# Patient Record
Sex: Female | Born: 1945
Health system: Southern US, Community
[De-identification: ages and names within clinical notes are randomized; demographics above are authoritative.]

## PROBLEM LIST (undated history)

## (undated) DIAGNOSIS — M47812 Spondylosis without myelopathy or radiculopathy, cervical region: Secondary | ICD-10-CM

## (undated) DIAGNOSIS — M1990 Unspecified osteoarthritis, unspecified site: Secondary | ICD-10-CM

## (undated) DIAGNOSIS — I872 Venous insufficiency (chronic) (peripheral): Secondary | ICD-10-CM

## (undated) DIAGNOSIS — G629 Polyneuropathy, unspecified: Secondary | ICD-10-CM

## (undated) DIAGNOSIS — I739 Peripheral vascular disease, unspecified: Secondary | ICD-10-CM

## (undated) DIAGNOSIS — K449 Diaphragmatic hernia without obstruction or gangrene: Secondary | ICD-10-CM

## (undated) DIAGNOSIS — G4733 Obstructive sleep apnea (adult) (pediatric): Secondary | ICD-10-CM

## (undated) DIAGNOSIS — N3281 Overactive bladder: Secondary | ICD-10-CM

## (undated) DIAGNOSIS — I10 Essential (primary) hypertension: Secondary | ICD-10-CM

## (undated) DIAGNOSIS — K219 Gastro-esophageal reflux disease without esophagitis: Secondary | ICD-10-CM

## (undated) DIAGNOSIS — E119 Type 2 diabetes mellitus without complications: Secondary | ICD-10-CM

## (undated) DIAGNOSIS — M797 Fibromyalgia: Secondary | ICD-10-CM

## (undated) DIAGNOSIS — M069 Rheumatoid arthritis, unspecified: Secondary | ICD-10-CM

## (undated) DIAGNOSIS — K929 Disease of digestive system, unspecified: Secondary | ICD-10-CM

## (undated) DIAGNOSIS — H409 Unspecified glaucoma: Secondary | ICD-10-CM

## (undated) HISTORY — PX: ABDOMINAL HYSTERECTOMY: SHX81

## (undated) HISTORY — DX: Venous insufficiency (chronic) (peripheral): I87.2

## (undated) HISTORY — DX: Obstructive sleep apnea (adult) (pediatric): G47.33

## (undated) HISTORY — PX: CARPAL TUNNEL RELEASE: SHX101

## (undated) HISTORY — PX: BLADDER SURGERY: SHX569

## (undated) HISTORY — PX: CHOLECYSTECTOMY: SHX55

## (undated) HISTORY — DX: Peripheral vascular disease, unspecified: I73.9

## (undated) HISTORY — PX: TOTAL KNEE ARTHROPLASTY: SHX125

---

## 1992-06-22 DIAGNOSIS — M47812 Spondylosis without myelopathy or radiculopathy, cervical region: Secondary | ICD-10-CM

## 1992-06-22 HISTORY — DX: Spondylosis without myelopathy or radiculopathy, cervical region: M47.812

## 2004-05-19 ENCOUNTER — Encounter: Payer: Self-pay | Admitting: Occupational Therapy

## 2004-05-22 ENCOUNTER — Encounter: Payer: Self-pay | Admitting: Occupational Therapy

## 2004-06-22 ENCOUNTER — Encounter: Payer: Self-pay | Admitting: Occupational Therapy

## 2004-12-02 ENCOUNTER — Ambulatory Visit: Admission: RE | Admit: 2004-12-02 | Discharge: 2004-12-02 | Payer: Self-pay | Admitting: Occupational Therapy

## 2004-12-05 ENCOUNTER — Ambulatory Visit: Payer: Self-pay | Admitting: Pulmonary Disease

## 2005-01-11 ENCOUNTER — Emergency Department (HOSPITAL_COMMUNITY): Admission: EM | Admit: 2005-01-11 | Discharge: 2005-01-11 | Payer: Self-pay | Admitting: Emergency Medicine

## 2005-11-09 ENCOUNTER — Ambulatory Visit: Admission: RE | Admit: 2005-11-09 | Discharge: 2005-11-09 | Payer: Self-pay | Admitting: Nurse Practitioner

## 2012-07-21 ENCOUNTER — Ambulatory Visit (HOSPITAL_COMMUNITY)
Admission: RE | Admit: 2012-07-21 | Discharge: 2012-07-21 | Disposition: A | Discharge status: Home / Self Care | Payer: Medicare Other | Source: Ambulatory Visit | Attending: Internal Medicine | Admitting: Internal Medicine

## 2012-07-21 DIAGNOSIS — M542 Cervicalgia: Secondary | ICD-10-CM | POA: Insufficient documentation

## 2012-07-21 DIAGNOSIS — M545 Low back pain, unspecified: Secondary | ICD-10-CM | POA: Insufficient documentation

## 2012-07-21 DIAGNOSIS — M6281 Muscle weakness (generalized): Secondary | ICD-10-CM | POA: Insufficient documentation

## 2012-07-21 DIAGNOSIS — IMO0001 Reserved for inherently not codable concepts without codable children: Secondary | ICD-10-CM | POA: Insufficient documentation

## 2012-07-21 DIAGNOSIS — M25519 Pain in unspecified shoulder: Secondary | ICD-10-CM | POA: Insufficient documentation
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
## 2012-07-26 ENCOUNTER — Ambulatory Visit (HOSPITAL_COMMUNITY)
Admission: RE | Admit: 2012-07-26 | Discharge: 2012-07-26 | Disposition: A | Discharge status: Home / Self Care | Payer: Medicare Other | Source: Ambulatory Visit | Attending: Internal Medicine | Admitting: Internal Medicine

## 2012-07-26 DIAGNOSIS — IMO0001 Reserved for inherently not codable concepts without codable children: Secondary | ICD-10-CM | POA: Insufficient documentation

## 2012-07-26 DIAGNOSIS — M6281 Muscle weakness (generalized): Secondary | ICD-10-CM | POA: Insufficient documentation

## 2012-07-26 DIAGNOSIS — M25519 Pain in unspecified shoulder: Secondary | ICD-10-CM | POA: Insufficient documentation

## 2012-07-26 DIAGNOSIS — M545 Low back pain, unspecified: Secondary | ICD-10-CM | POA: Insufficient documentation

## 2012-07-26 DIAGNOSIS — M542 Cervicalgia: Secondary | ICD-10-CM | POA: Insufficient documentation

## 2012-07-26 NOTE — Progress Notes (Signed)
Physical Therapy Treatment Patient Details  Name: Chloe Golden MRN: [id number] Date of Birth: 03/02/1946  Today's Date: 07/26/2012 Time: 1019-1050 PT Time Calculation (min): 31 min  Visit#: 2  of 8   Re-eval: 08/20/12 Diagnosis:  Neck/shoulder pain Next MD Visit: Dr. Ignacia Palma - internal medicine.  Prior Therapy: conservative treatment for fibromyalgia for neck/shoulder and back pain Authorization: MEDICARE  Authorization Visit#: 2  of 10  Charges:  therex 27', MHP X 1   Subjective: Symptoms/Limitations Symptoms: Pt. states she is aching all over today, possibly due to the weather.  Reports pain as 8/10 today from cervical area down to lumbar and also into shoulders. Pain Assessment Currently in Pain?: Yes Pain Score:   8 Pain Location: Back Pain Orientation: Upper;Mid;Lower;Medial   Exercise/Treatments Seated Other Seated Lumbar Exercises: heel roll outs 2x10 sec holds Supine Ab Set: Limitations AB Set Limitations: NMR for activation 5X10" holds Glut Set: 5 reps Clam: 5 reps Bent Knee Raise: 5 reps Bridge: 5 reps Straight Leg Raise: 5 reps   Modalities Modalities: Moist Heat Moist Heat Therapy Number Minutes Moist Heat: 25 Minutes Moist Heat Location:  (cervical and lumbar with supine therex)  Physical Therapy Assessment and Plan PT Assessment and Plan Clinical Impression Statement: Pt. with increased pain today; added MHP to supine therex and progress stab exercises in supine.  Held prone exericse today and addition of SL due to increased pain.  Pt. reported overall pain reduction at end of session today. PT Plan: Progress to SL hip abduction, prone hip extension, POE and spine extension.  Manual techniques and modalities as needed per PT POC.      Problem List Patient Active Problem List  Diagnosis  . Shoulder pain  . Neck pain  . Lumbago      Amy Kae Heller, PTA/CLT 07/26/2012, 10:57 AM

## 2012-07-28 ENCOUNTER — Ambulatory Visit (HOSPITAL_COMMUNITY)
Admission: RE | Admit: 2012-07-28 | Discharge: 2012-07-28 | Disposition: A | Discharge status: Home / Self Care | Payer: Medicare Other | Source: Ambulatory Visit | Attending: Physical Therapy | Admitting: Physical Therapy

## 2012-07-28 NOTE — Progress Notes (Signed)
Physical Therapy Re-evaluation / Discharge  Patient Details  Name: Chloe Golden MRN: [id number] Date of Birth: 16-Dec-1945  Today's Date: 07/28/2012 Time: 1015-1100 PT Time Calculation (min): 45 min  Visit#: 3  of 8   Re-eval: 08/20/12 Diagnosis: Neck/shoulder pain Next MD Visit: Dr. Ignacia Palma - internal medicine.  Prior Therapy: conservative treatment for fibromyalgia for neck/shoulder and back pain Authorization: MEDICARE  Authorization Visit#: 3  of 10   Charges: therex 26', MMT X 1  Subjective Symptoms/Limitations Symptoms: Pt. states her daughter bought her a contoured pillow and a heating pad and has helped.  States she has no neck pain today and lumbar pain is decreased to 6/10 right now. Pain Assessment Currently in Pain?: Yes Pain Score:   6 Pain Location: Back Pain Orientation: Upper;Mid;Lower;Medial   Objective: Functional Tests Functional Tests: NDI: 42% (was 36%)   RUE Strength Right Shoulder Flexion: 5/5 (was 4/5) Right Shoulder Extension: 4/5 (was 3+/5) Right Shoulder ABduction: 4/5 (was 3+/5) Right Shoulder Internal Rotation: 4/5 (was 3+/5) Right Shoulder External Rotation: 4/5 (ws 3+/5) Right Shoulder Horizontal ABduction: 4/5 (was 3+/5) Right Shoulder Horizontal ADduction: 4/5 (was 3+/5)  LUE Strength Left Shoulder Flexion: 5/5 (was 4/5) Left Shoulder Extension: 4/5 (was 4/5) Left Shoulder ABduction: 5/5 (was 4/5) Left Shoulder Internal Rotation: 4/5 (was 4/5) Left Shoulder External Rotation: 4/5 (was 4/5) Left Shoulder Horizontal ABduction: 4/5 (was 4/5) Left Shoulder Horizontal ADduction: 4/5 (was 4/5)  RLE Strength Right Hip Flexion: 5/5 (was 3+/5) Right Hip Extension: 5/5 (was 3+/5) Right Hip ABduction: 4/5 (was 4/5) Right Knee Extension: 4/5 (was 4/5) Right Ankle Dorsiflexion: 5/5 (was 4/5)  LLE Strength Left Hip Flexion: 5/5 (was 4/5) Left Hip Extension: 4/5 (was 4/5) Left Hip ABduction: 4/5 (was 4/5) Left Knee Flexion: 5/5 (was  5/5) Left Knee Extension: 5/5 (was 5/5)  Cervical AROM Overall Cervical AROM Comments: all WNL - less pain w/rotation  Lumbar AROM Lumbar Flexion: WNL  Lumbar Extension: WNL  Lumbar - Right Side Bend: WNL Lumbar - Left Side Bend: WNL   Exercise/Treatments Supine Glut Set: 10 reps Clam: 10 reps Bent Knee Raise: 10 reps Bridge: 10 reps Straight Leg Raise: 10 reps Prone  Straight Leg Raise: 5 reps Other Prone Lumbar Exercises: Multifidus: NMR w/anterior tilt Other Prone Lumbar Exercises: POE 5 minutes    Physical Therapy Assessment and Plan PT Assessment and Plan Clinical Impression Statement: Pt has overall improved X past 3 visits and is being compliant with HEP.  Pt. has met 2/4 STG's and none of her LTG's due to only completing 3 visits.  Pt. educated on postural awareness/core stability  and given updated HEP.  Pt would like to discontinue therapy at this time due to financial reasons.  Pt. will continue and progress exercises with HEP. PT Plan: Discharged to HEP per pt. request.   Pt. Given updated HEP with written instructions.  Goals Home Exercise Program Pt will Perform Home Exercise Program: Independently PT Goal: Perform Home Exercise Program - Progress: Met  PT Short Term Goals Time to Complete Short Term Goals: 3 weeks PT Short Term Goal 1: Pt will report pain less than 6/10 for 50% of her day for improved QOL.   PT Short Term Goal 1 - Progress: Not met PT Short Term Goal 2: Pt will present with mild fascial restriction to cervical - lumbar musculature to decrease overall pain.  PT Short Term Goal 2 - Progress: Partly met PT Short Term Goal 3: Pt will present with cervical and shoulder  ROM WNL w/o pain at end range in order to begin cutting vegatables with greater ease.  PT Short Term Goal 3 - Progress: Met PT Short Term Goal 4: Pt will require mod cueing for core musculature in order to progress towards apporpriate posture.   PT Short Term Goal 4 - Progress:  Partly met PT Short Term Goal 5: Pt will present with improved body awareness and demonstrate sitting and standing posture and require mod cueing to comfortably continue with dinner activities.  PT Short Term Goal 5 - Progress: Met  PT Long Term Goals Time to Complete Long Term Goals: 8 weeks PT Long Term Goal 1: Pt will report pain less than 4/10 when standing for greater than 45 minutes in order to prepare a meal.  PT Long Term Goal 1 - Progress: Not met PT Long Term Goal 2: Pt will score less than a 20% on the NDI for improved percieved functional ability.  PT Long Term Goal 2 - Progress: Not met Long Term Goal 3: Pt will be independent with core muscle activiation in order to tolerate standing for greater than 45 minutes to prepare a meal. Long Term Goal 3 Progress: Not met Long Term Goal 4: Pt will improve LE flexibility w/SLR greater than 90 degrees in order to decrease numbness to B LE in order to tolerate walking for 1 hour to go shopping without increaed foot pain.  Long Term Goal 4 Progress: Not met  Problem List Patient Active Problem List  Diagnosis  . Shoulder pain  . Neck pain  . Lumbago    PT - End of Session Activity Tolerance: Patient tolerated treatment well General Behavior During Session: [facility] for tasks performed Cognition: [facility] for tasks performed   Lurena Nida, PTA/CLT 07/28/2012, 11:16 AM

## 2012-08-02 ENCOUNTER — Ambulatory Visit (HOSPITAL_COMMUNITY): Payer: Medicare Other | Admitting: Physical Therapy

## 2012-08-05 ENCOUNTER — Ambulatory Visit (HOSPITAL_COMMUNITY): Payer: Medicare Other

## 2012-08-09 ENCOUNTER — Ambulatory Visit (HOSPITAL_COMMUNITY): Payer: Medicare Other

## 2012-08-11 ENCOUNTER — Ambulatory Visit (HOSPITAL_COMMUNITY): Payer: Medicare Other | Admitting: Physical Therapy

## 2012-09-26 ENCOUNTER — Encounter (HOSPITAL_COMMUNITY): Payer: Self-pay | Admitting: Emergency Medicine

## 2012-09-26 ENCOUNTER — Emergency Department (HOSPITAL_COMMUNITY)
Admission: EM | Admit: 2012-09-26 | Discharge: 2012-09-26 | Disposition: A | Discharge status: Home / Self Care | Payer: Medicare Other | Attending: Emergency Medicine | Admitting: Emergency Medicine

## 2012-09-26 DIAGNOSIS — R21 Rash and other nonspecific skin eruption: Secondary | ICD-10-CM | POA: Insufficient documentation

## 2012-09-26 DIAGNOSIS — R079 Chest pain, unspecified: Secondary | ICD-10-CM

## 2012-09-26 DIAGNOSIS — R197 Diarrhea, unspecified: Secondary | ICD-10-CM

## 2012-09-26 DIAGNOSIS — R6883 Chills (without fever): Secondary | ICD-10-CM | POA: Insufficient documentation

## 2012-09-26 DIAGNOSIS — Z8719 Personal history of other diseases of the digestive system: Secondary | ICD-10-CM | POA: Insufficient documentation

## 2012-09-26 DIAGNOSIS — Z8669 Personal history of other diseases of the nervous system and sense organs: Secondary | ICD-10-CM | POA: Insufficient documentation

## 2012-09-26 DIAGNOSIS — Z8619 Personal history of other infectious and parasitic diseases: Secondary | ICD-10-CM | POA: Insufficient documentation

## 2012-09-26 DIAGNOSIS — R109 Unspecified abdominal pain: Secondary | ICD-10-CM

## 2012-09-26 DIAGNOSIS — R1013 Epigastric pain: Secondary | ICD-10-CM | POA: Insufficient documentation

## 2012-09-26 DIAGNOSIS — R072 Precordial pain: Secondary | ICD-10-CM | POA: Insufficient documentation

## 2012-09-26 DIAGNOSIS — R110 Nausea: Secondary | ICD-10-CM | POA: Insufficient documentation

## 2012-09-26 DIAGNOSIS — Z90710 Acquired absence of both cervix and uterus: Secondary | ICD-10-CM | POA: Insufficient documentation

## 2012-09-26 DIAGNOSIS — R0602 Shortness of breath: Secondary | ICD-10-CM | POA: Insufficient documentation

## 2012-09-26 DIAGNOSIS — Z8739 Personal history of other diseases of the musculoskeletal system and connective tissue: Secondary | ICD-10-CM | POA: Insufficient documentation

## 2012-09-26 DIAGNOSIS — I10 Essential (primary) hypertension: Secondary | ICD-10-CM | POA: Insufficient documentation

## 2012-09-26 DIAGNOSIS — H9209 Otalgia, unspecified ear: Secondary | ICD-10-CM | POA: Insufficient documentation

## 2012-09-26 HISTORY — DX: Gastro-esophageal reflux disease without esophagitis: K21.9

## 2012-09-26 HISTORY — DX: Spondylosis without myelopathy or radiculopathy, cervical region: M47.812

## 2012-09-26 HISTORY — DX: Unspecified osteoarthritis, unspecified site: M19.90

## 2012-09-26 HISTORY — DX: Essential (primary) hypertension: I10

## 2012-09-26 HISTORY — DX: Fibromyalgia: M79.7

## 2012-09-26 HISTORY — DX: Rheumatoid arthritis, unspecified: M06.9

## 2012-09-26 HISTORY — DX: Polyneuropathy, unspecified: G62.9

## 2012-09-26 LAB — COMPREHENSIVE METABOLIC PANEL
ALT: 15 U/L (ref 0–35)
AST: 20 U/L (ref 0–37)
Albumin: 3.6 g/dL (ref 3.5–5.2)
Alkaline Phosphatase: 92 U/L (ref 39–117)
BUN: 13 mg/dL (ref 6–23)
CO2: 25 mEq/L (ref 19–32)
Calcium: 8.9 mg/dL (ref 8.4–10.5)
Chloride: 98 mEq/L (ref 96–112)
Creatinine, Ser: 0.84 mg/dL (ref 0.50–1.10)
GFR calc Af Amer: 82 mL/min — ABNORMAL LOW (ref >90)
GFR calc non Af Amer: 71 mL/min — ABNORMAL LOW (ref >90)
Glucose, Bld: 122 mg/dL — ABNORMAL HIGH (ref 70–99)
Potassium: 3.5 mEq/L (ref 3.5–5.1)
Sodium: 135 mEq/L (ref 135–145)
Total Bilirubin: 0.2 mg/dL — ABNORMAL LOW (ref 0.3–1.2)
Total Protein: 7.1 g/dL (ref 6.0–8.3)

## 2012-09-26 LAB — URINE MICROSCOPIC-ADD ON

## 2012-09-26 LAB — CBC WITH DIFFERENTIAL/PLATELET
Basophils Absolute: 0 10*3/uL (ref 0.0–0.1)
Basophils Relative: 0 % (ref 0–1)
Eosinophils Absolute: 0.1 10*3/uL (ref 0.0–0.7)
Eosinophils Relative: 1 % (ref 0–5)
HCT: 38.2 % (ref 36.0–46.0)
Hemoglobin: 12.7 g/dL (ref 12.0–15.0)
Lymphocytes Relative: 6 % — ABNORMAL LOW (ref 12–46)
Lymphs Abs: 0.5 10*3/uL — ABNORMAL LOW (ref 0.7–4.0)
MCH: 29.5 pg (ref 26.0–34.0)
MCHC: 33.2 g/dL (ref 30.0–36.0)
MCV: 88.8 fL (ref 78.0–100.0)
Monocytes Absolute: 0.3 10*3/uL (ref 0.1–1.0)
Monocytes Relative: 4 % (ref 3–12)
Neutro Abs: 7.3 10*3/uL (ref 1.7–7.7)
Neutrophils Relative %: 89 % — ABNORMAL HIGH (ref 43–77)
Platelets: 316 10*3/uL (ref 150–400)
RBC: 4.3 MIL/uL (ref 3.87–5.11)
RDW: 13.6 % (ref 11.5–15.5)
WBC: 8.1 10*3/uL (ref 4.0–10.5)

## 2012-09-26 LAB — URINALYSIS, ROUTINE W REFLEX MICROSCOPIC
Bilirubin Urine: NEGATIVE
Glucose, UA: NEGATIVE mg/dL
Ketones, ur: NEGATIVE mg/dL
Leukocytes, UA: NEGATIVE
Nitrite: NEGATIVE
Protein, ur: NEGATIVE mg/dL
Specific Gravity, Urine: 1.015 (ref 1.005–1.030)
Urobilinogen, UA: 0.2 mg/dL (ref 0.0–1.0)
pH: 5.5 (ref 5.0–8.0)

## 2012-09-26 LAB — TROPONIN I
Troponin I: <0.3 ng/mL (ref <0.30)
Troponin I: <0.3 ng/mL (ref <0.30)

## 2012-09-26 LAB — LIPASE, BLOOD: Lipase: 13 U/L (ref 11–59)

## 2012-09-26 MED ORDER — MORPHINE SULFATE 2 MG/ML IJ SOLN
2.0000 mg | Freq: Once | INTRAMUSCULAR | Status: AC
  Administered 2012-09-26: 2 mg via INTRAVENOUS
  Filled 2012-09-26: qty 1

## 2012-09-26 MED ORDER — ONDANSETRON HCL 4 MG/2ML IJ SOLN
4.0000 mg | Freq: Once | INTRAMUSCULAR | Status: AC
Start: 2012-09-26 — End: 2012-09-26
  Administered 2012-09-26: 4 mg via INTRAVENOUS
  Filled 2012-09-26: qty 2

## 2012-09-26 MED ORDER — PANTOPRAZOLE SODIUM 40 MG IV SOLR
40.0000 mg | Freq: Once | INTRAVENOUS | Status: AC
  Administered 2012-09-26: 40 mg via INTRAVENOUS
  Filled 2012-09-26: qty 40

## 2012-09-26 NOTE — ED Provider Notes (Signed)
History  This chart was scribed for Chloe Gaskins, MD by Bennett Scrape, ED Scribe. This patient was seen in room APA05/[facility] and the patient's care was started at 8:16 PM.  CSN: [id number]  Arrival date & time 09/26/12  1955   First MD Initiated Contact with Patient 09/26/12 2016      Chief Complaint  Patient presents with  . Abdominal Pain  . Nausea  . Diarrhea    Patient is a 67 y.o. female presenting with chest pain. The history is provided by the patient. No language interpreter was used.  Chest Pain Pain location:  Substernal area and epigastric Pain quality: burning   Pain radiates to:  Epigastrium Pain radiates to the back: no   Onset quality:  Gradual Duration:  3 months Timing:  Constant Progression:  Worsening Chronicity:  New Relieved by:  Nothing Worsened by:  Nothing tried Associated symptoms: abdominal pain, nausea and shortness of breath   Associated symptoms: no cough, no fever, no syncope and not vomiting   Risk factors: no diabetes mellitus, no hypertension, no prior DVT/PE and no smoking     TEHYA LEATH is a 67 y.o. female with a h/o GERD who presents to the Emergency Department complaining of worsening of 3 months of ongoing lower substernal CP described as burning that radiates up from the epigastric region with intermittent SOB and nausea. She rates her pain an 8 out of 10 currently. She reports that she is being followed by Dr. Allena Katz for the same as well as 3 months of abdominal bloating, otalgia, chills, and diarrhea. She states that during her last appointment three weeks ago she was told that she had a negative CT scan and was advised to take Pepto bismol.  She denies that stool samples or blood work was performed. She states that she has been taking Pepto bismol with no improvement. She reports a chronic rash on the chest with no recent changes. She denies syncope, hematochezia, melena, fever and emesis as associated symptoms. She denies any prior  MI or CVA diagnosese. She has a h/o abdominal hysterectomy, cholecystectomy and bacterial infection of the colon 7 years ago. She denies smoking and alcohol use.  Past Medical History  Diagnosis Date  . Acid reflux   . Hypertension   . Neuropathy   . Arthritis   . Rheumatoid arthritis   . Degenerative arthritis of cervical spine   . Fibromyalgia     Past Surgical History  Procedure Laterality Date  . Cholecystectomy    . Abdominal hysterectomy    . Carpal tunnel release    . Bladder surgery      History reviewed. No pertinent family history.  History  Substance Use Topics  . Smoking status: Never Smoker   . Smokeless tobacco: Not on file  . Alcohol Use: No    No OB history provided.  Review of Systems  Constitutional: Positive for chills. Negative for fever.  HENT: Positive for ear pain. Negative for sore throat.   Respiratory: Positive for shortness of breath. Negative for cough.   Cardiovascular: Positive for chest pain. Negative for syncope.  Gastrointestinal: Positive for nausea, abdominal pain and diarrhea. Negative for vomiting and blood in stool.  All other systems reviewed and are negative.    Allergies  Ciprofloxacin; Ibuprofen; and Valium  Home Medications  No current outpatient prescriptions on file.  Triage Vitals: BP 173/76  Pulse 93  Temp(Src) 98.6 F (37 C) (Oral)  Resp 18  Ht 5'  4" (1.626 m)  Wt 240 lb (108.863 kg)  BMI 41.18 kg/m2  SpO2 94%  Physical Exam  Nursing note and vitals reviewed.  CONSTITUTIONAL: Well developed/well nourished HEAD: Normocephalic/atraumatic EYES: EOMI/PERRL, no icterus ENMT: Mucous membranes moist NECK: supple no meningeal signs SPINE:entire spine nontender CV: S1/S2 noted, no murmurs/rubs/gallops noted LUNGS: Lungs are clear to auscultation bilaterally, no apparent distress ABDOMEN: soft, nontender, no rebound or guarding GU:no cva tenderness NEURO: Pt is awake/alert, moves all  extremitiesx4 EXTREMITIES: pulses normal, full ROM SKIN: warm, color normal PSYCH: no abnormalities of mood noted   ED Course  Procedures   DIAGNOSTIC STUDIES: Oxygen Saturation is 94% on room air, adequate by my interpretation.    COORDINATION OF CARE: 8:23 PM-Discussed treatment plan which includes medications, CBC panel, CMP, and troponin with pt at bedside and pt agreed to plan.   8:30 PM-Ordered 40 mg protonix injection, 2 mg morphine and 4 mg Zofran injection  9:19 PM- Pt rechecked and states that the medications improved her symptoms. She states that her CP begins when she gets bloated. The pain began at 4 PM this evening and ended around 7 PM. She denies having any significant CP currently. Informed pt of lab work thus far. Will provide a GI referral.  10:47 PM Repeat troponin negative Pt without any recurrence of symptoms She is in no distress She requests referral to local [facility] for d/c  Labs Reviewed  COMPREHENSIVE METABOLIC PANEL - Abnormal; Notable for the following:    Glucose, Bld 122 (*)    Total Bilirubin 0.2 (*)    GFR calc non Af Amer 71 (*)    GFR calc Af Amer 82 (*)    All other components within normal limits  CBC WITH DIFFERENTIAL - Abnormal; Notable for the following:    Neutrophils Relative 89 (*)    Lymphocytes Relative 6 (*)    Lymphs Abs 0.5 (*)    All other components within normal limits  URINALYSIS, ROUTINE W REFLEX MICROSCOPIC - Abnormal; Notable for the following:    Hgb urine dipstick TRACE (*)    All other components within normal limits  URINE MICROSCOPIC-ADD ON - Abnormal; Notable for the following:    Squamous Epithelial / LPF FEW (*)    Bacteria, UA FEW (*)    All other components within normal limits  URINE CULTURE  LIPASE, BLOOD  TROPONIN I  TROPONIN I     MDM  Nursing notes including past medical history and social history reviewed and considered in documentation Labs/vital reviewed and considered     Date:  09/26/2012  Rate: 87  Rhythm: normal sinus rhythm  QRS Axis: normal  Intervals: normal  ST/T Wave abnormalities: normal  Conduction Disutrbances:none  Narrative Interpretation:   Old EKG Reviewed: none available at time of interpretation    I personally performed the services described in this documentation, which was scribed in my presence. The recorded information has been reviewed and is accurate.   Chloe Gaskins, MD 09/26/12 2248

## 2012-09-26 NOTE — ED Notes (Signed)
Patient complaining of abdominal pain, nausea, and diarrhea x 3 months, worsening today and states pain is radiating into mid chest area.

## 2012-09-26 NOTE — ED Notes (Signed)
Patient ambulatory to restroom to collect urine sample. Family at bedside

## 2012-09-27 ENCOUNTER — Telehealth: Payer: Self-pay

## 2012-09-27 NOTE — Telephone Encounter (Signed)
Per [place], just schedule with [facility] on 09/28/2012. Pt scheduled and she is aware of appt.

## 2012-09-27 NOTE — Telephone Encounter (Signed)
There is an opening on Dr. Luvenia Starch schedule at 10:30 today, you can ask him.  Otherwise, you can schedule her for my 9:30 and 9:45 tomorrow.   Do not use one of the urgent's for this since she was just seen in ED and work-up okay so far.

## 2012-09-27 NOTE — Telephone Encounter (Signed)
Pt was seen in ED yesterday for abdominal pain. She is calling this AM for appt. Said she has been bloated for about 2 months and at night she feels like she is "going to burst". She has been nauseated x 2 days and can hardly eat anything but a few bites of dry cracker. She was up all night with nausea, but cannot vomit. Her abdominal pain is in the middle of her stomach and down. She rates it at an 8 this morning. ( She has had her GB removed). She had diarrhea yesterday but nothing today. She said she had a bacteria in intestines some years ago and she is concerned about that. Please advise!

## 2012-09-28 ENCOUNTER — Encounter: Payer: Self-pay | Admitting: Gastroenterology

## 2012-09-28 ENCOUNTER — Ambulatory Visit (INDEPENDENT_AMBULATORY_CARE_PROVIDER_SITE_OTHER): Payer: Medicare Other | Admitting: Gastroenterology

## 2012-09-28 VITALS — BP 134/71 | HR 73 | Temp 98.4°F | Ht 64.0 in | Wt 198.2 lb

## 2012-09-28 DIAGNOSIS — R142 Eructation: Secondary | ICD-10-CM

## 2012-09-28 DIAGNOSIS — R635 Abnormal weight gain: Secondary | ICD-10-CM | POA: Insufficient documentation

## 2012-09-28 DIAGNOSIS — R1314 Dysphagia, pharyngoesophageal phase: Secondary | ICD-10-CM

## 2012-09-28 DIAGNOSIS — R141 Gas pain: Secondary | ICD-10-CM

## 2012-09-28 DIAGNOSIS — K449 Diaphragmatic hernia without obstruction or gangrene: Secondary | ICD-10-CM | POA: Insufficient documentation

## 2012-09-28 DIAGNOSIS — R1319 Other dysphagia: Secondary | ICD-10-CM

## 2012-09-28 DIAGNOSIS — R1310 Dysphagia, unspecified: Secondary | ICD-10-CM | POA: Insufficient documentation

## 2012-09-28 DIAGNOSIS — R140 Abdominal distension (gaseous): Secondary | ICD-10-CM | POA: Insufficient documentation

## 2012-09-28 DIAGNOSIS — R197 Diarrhea, unspecified: Secondary | ICD-10-CM | POA: Insufficient documentation

## 2012-09-28 DIAGNOSIS — K219 Gastro-esophageal reflux disease without esophagitis: Secondary | ICD-10-CM | POA: Insufficient documentation

## 2012-09-28 MED ORDER — ONDANSETRON HCL 4 MG PO TABS: 4.0000 mg | ORAL_TABLET | Freq: Three times a day (TID) | ORAL | Status: DC | PRN

## 2012-09-28 NOTE — Progress Notes (Signed)
Primary Care Physician:  Carlota Raspberry, MD  Primary Gastroenterologist:  Jonette Eva, MD   Chief Complaint  Patient presents with  . Abdominal Pain  . Bloated    HPI:  Chloe Golden is a 67 y.o. female here for next day appointment. She called yesterday after being seen in ED for abdominal pain, bloating, nausea.  Four to five months of abdominal swelling and bloating. Doesn't matter if eats or not. Will have it even with fasting. Seven years ago the same problem. Has been seeing Dr. Allena Katz. States that she is no longer going back to see him the CT recently. States she does not eat fried or fast foods due to bad GERD. Keeps diarrhea. Small amount of [place] stool 7. Never has a warning. Fecal incontinence. Usually 4-5 small BMs, loose stool daily. Tried Linzess, but worse diarrhea. Took only couple of times. Has to take stool softener everyday or gets constipated. Lately has been taking Pepto-Bismol 2 before each meal for the diarrhea. States that Dr. Allena Katz says she can take it for the rest of her life.   Went to ER due to Abdominal pain, nausea, regurgitation. States that her heart checked out fine. She has chronic spontaneous regurgitation. Had been on nexium for years, now on Dexilant for one month. Large hiatal hernia. Last EGD end of 2013. Colonoscopy five years ago. ?H.Pylori before. H/O one episode diverticulitis.   [facility] for two years. Takes stool softners every day otherwise stools get hard. Tramadol two times per day. Carafate BID before a meal. Pepto-Bismol 2 tablets before meals. Sleep stacked up on four pills. Bed elevated. Hernia for thirty years. No esophageal stretching. Drink two glasses of water to wash food down. Still with heartburn even on PPI. No brbpr. Black stools on Pepto. Constant nausea and epigastric burning. Weight gain of 15-20 pounds in the last one year. No NSAIDS/ASA.   Current Outpatient Prescriptions  Medication Sig Dispense Refill  . bismuth  subsalicylate (PEPTO BISMOL) 262 MG chewable tablet Chew 524 mg by mouth 4 (four) times daily -  before meals and at bedtime.      Marland Kitchen CARAFATE 1 GM/10ML suspension Take 1 g by mouth.       . dexlansoprazole (DEXILANT) 60 MG capsule Take 60 mg by mouth every morning.      . docusate sodium (COLACE) 100 MG capsule Take 100 mg by mouth every morning.      . Eszopiclone (ESZOPICLONE) 3 MG TABS Take 3 mg by mouth at bedtime. Take immediately before bedtime      . gabapentin (NEURONTIN) 300 MG capsule Take 300-600 mg by mouth 3 (three) times daily. Two capsules taken in the morning, One capsule at lunch, and two capsules with the evening meal      . losartan-hydrochlorothiazide (HYZAAR) 50-12.5 MG per tablet Take 1 tablet by mouth every morning.      . nebivolol (BYSTOLIC) 5 MG tablet Take 5 mg by mouth at bedtime.      . Probiotic Product (PHILLIPS COLON HEALTH PO) Take 1 capsule by mouth every morning.      . sertraline (ZOLOFT) 100 MG tablet Take 100 mg by mouth every morning.      . silodosin (RAPAFLO) 8 MG CAPS capsule Take 8 mg by mouth every morning.      Marland Kitchen tiZANidine (ZANAFLEX) 4 MG tablet Take 4 mg by mouth at bedtime.      . traMADol (ULTRAM) 50 MG tablet Take 50 mg by mouth  2 (two) times daily as needed for pain.      Marland Kitchen ondansetron (ZOFRAN) 4 MG tablet Take 1 tablet (4 mg total) by mouth every 8 (eight) hours as needed for nausea.  30 tablet  1   No current facility-administered medications for this visit.    Allergies as of 09/28/2012 - Review Complete 09/28/2012  Allergen Reaction Noted  . Ciprofloxacin Nausea And Vomiting and Other (See Comments) 09/26/2012  . Codeine Other (See Comments) 09/26/2012  . Ibuprofen Other (See Comments) 09/26/2012  . Valium (diazepam) Other (See Comments) 09/26/2012    Past Medical History  Diagnosis Date  . Acid reflux   . Hypertension   . Neuropathy   . Arthritis   . Rheumatoid arthritis   . Degenerative arthritis of cervical spine   .  Fibromyalgia     Past Surgical History  Procedure Laterality Date  . Cholecystectomy    . Abdominal hysterectomy    . Carpal tunnel release    . Bladder surgery      Family History  Problem Relation Age of Onset  . Lung cancer Father   . Colon cancer Neg Hx   . Crohn's disease Cousin   . Celiac disease Neg Hx     History   Social History  . Marital Status: Single    Spouse Name: N/A    Number of Children: N/A  . Years of Education: N/A   Occupational History  . Not on file.   Social History Main Topics  . Smoking status: Never Smoker   . Smokeless tobacco: Not on file  . Alcohol Use: No  . Drug Use: No  . Sexually Active: Not on file   Other Topics Concern  . Not on file   Social History Narrative   1 living   2 deceased      ROS:  General: Negative for anorexia, weight loss, fever, chills, fatigue, weakness. Eyes: Negative for vision changes.  ENT: Negative for hoarseness,  nasal congestion. CV: Negative for chest pain, angina, palpitations, dyspnea on exertion, peripheral edema.  Respiratory: Negative for dyspnea at rest, dyspnea on exertion, cough, sputum, wheezing.  GI: See history of present illness. GU:  Negative for dysuria, hematuria, urinary incontinence, urinary frequency, nocturnal urination.  MS: Negative for joint pain, low back pain.  Derm: Negative for rash or itching.  Neuro: Negative for weakness, abnormal sensation, seizure, frequent headaches, memory loss, confusion.  Psych: Negative for anxiety, depression, suicidal ideation, hallucinations.  Endo: see hpi  Heme: Negative for bruising or bleeding. Allergy: Negative for rash or hives.    Physical Examination:  BP 134/71  Pulse 73  Temp(Src) 98.4 F (36.9 C) (Oral)  Ht 5\' 4"  (1.626 m)  Wt 198 lb 3.2 oz (89.903 kg)  BMI 34 kg/m2   General: Well-nourished, well-developed in no acute distress.  Head: Normocephalic, atraumatic.   Eyes: Conjunctiva pink, no icterus. Mouth:  Oropharyngeal mucosa moist and pink , no lesions erythema or exudate. Neck: Supple without thyromegaly, masses, or lymphadenopathy.  Lungs: Clear to auscultation bilaterally.  Heart: Regular rate and rhythm, no murmurs rubs or gallops.  Abdomen: Bowel sounds are normal, nontender, nondistended, no hepatosplenomegaly or masses, no abdominal bruits or    hernia , no rebound or guarding.   Rectal: not performed Extremities: No lower extremity edema. No clubbing or deformities.  Neuro: Alert and oriented x 4 , grossly normal neurologically.  Skin: Warm and dry, no rash or jaundice.   Psych: Alert and cooperative,  normal mood and affect.  Labs: Lab Results  Component Value Date   CREATININE 0.84 09/26/2012   BUN 13 09/26/2012   NA 135 09/26/2012   K 3.5 09/26/2012   CL 98 09/26/2012   CO2 25 09/26/2012   Lab Results  Component Value Date   ALT 15 09/26/2012   AST 20 09/26/2012   ALKPHOS 92 09/26/2012   BILITOT 0.2* 09/26/2012   Lab Results  Component Value Date   WBC 8.1 09/26/2012   HGB 12.7 09/26/2012   HCT 38.2 09/26/2012   MCV 88.8 09/26/2012   PLT 316 09/26/2012   Lab Results  Component Value Date   LIPASE 13 09/26/2012   Lab Results  Component Value Date   TROPONINI <0.30 09/26/2012     Imaging Studies: GES done at [facility] on 09/22/12: Slight improvement in gastric emptying now near normal with T1/2 of 50 minutes.

## 2012-09-28 NOTE — Assessment & Plan Note (Signed)
Reports normal thyroid testing within last 6 months.

## 2012-09-28 NOTE — Assessment & Plan Note (Signed)
67 y/o lady with chronic gastroesophageal reflux disease, reported large hiatal hernia with frequent spontaneous regurgitation, dysphagia. She is on Carafate and Dexilant at this point with ongoing heartburn, regurgitation, dysphagia. She reports having an upper endoscopy less than one year ago by Dr. Allena Katz. We have requested those records. Recently has slightly delayed gastric emptying on gastric emptying study. She reports having a CT scan which we have requested records. Further recommendations to follow once records have been received.  Continue Carafate, Dexilant for now. Consume frequent small meals, do not overeat.

## 2012-09-28 NOTE — Assessment & Plan Note (Signed)
Interestingly she c/o uncontrollable diarrhea without warning. Several times per day. She takes colace to prevent dry and hard stool. She is taking Pepto to slow stools down. Ddx: IBS, celiac, microscopic colitis, IBD. Review records. Continue current medication regimen for now.

## 2012-09-28 NOTE — Progress Notes (Signed)
Cc PCP 

## 2012-09-28 NOTE — Patient Instructions (Addendum)
We will get a copy of your medical records. Further recommendations to follow review of them. Zofran has been sent to your pharmacy for nausea. Call with any questions or concerns.  Bloating Bloating is the feeling of fullness in your belly. You may feel as though your pants are too tight. Often the cause of bloating is overeating, retaining fluids, or having gas in your bowel. It is also caused by swallowing air and eating foods that cause gas. Irritable bowel syndrome is one of the most common causes of bloating. Constipation is also a common cause. Sometimes more serious problems can cause bloating. SYMPTOMS  Usually there is a feeling of fullness, as though your abdomen is bulged out. There may be mild discomfort.  DIAGNOSIS  Usually no particular testing is necessary for most bloating. If the condition persists and seems to become worse, your caregiver may do additional testing.  TREATMENT   There is no direct treatment for bloating.  Do not put gas into the bowel. Avoid chewing gum and sucking on candy. These tend to make you swallow air. Swallowing air can also be a nervous habit. Try to avoid this.  Avoiding high residue diets will help. Eat foods with soluble fibers (examples include root vegetables, apples, or barley) and substitute dairy products with soy and rice products. This helps irritable bowel syndrome.  If constipation is the cause, then a high residue diet with more fiber will help.  Avoid carbonated beverages.  Over-the-counter preparations are available that help reduce gas. Your pharmacist can help you with this. SEEK MEDICAL CARE IF:   Bloating continues and seems to be getting worse.  You notice a weight gain.  You have a weight loss but the bloating is getting worse.  You have changes in your bowel habits or develop nausea or vomiting. SEEK IMMEDIATE MEDICAL CARE IF:   You develop shortness of breath or swelling in your legs.  You have an increase in  abdominal pain or develop chest pain. Document Released: 04/08/2006 Document Revised: 08/31/2011 Document Reviewed: 05/27/2007 [facility] Patient Information 2013 [place], [place].

## 2012-09-28 NOTE — Assessment & Plan Note (Signed)
Abdominal bloating with or without meals. Slight delay in gastric emptying. Review prior CT and endoscopies. Continue probiotics. She may need to have celiac disease screening, hydrogen breath testing. Further recommendations to follow.

## 2012-09-30 LAB — URINE CULTURE: Colony Count: 70000

## 2012-10-01 ENCOUNTER — Telehealth (HOSPITAL_COMMUNITY): Payer: Self-pay | Admitting: Emergency Medicine

## 2012-10-01 NOTE — ED Notes (Signed)
+   Urine Chart sent to EDP office for review. 

## 2012-10-01 NOTE — ED Notes (Signed)
Patient has +Urine culture. °

## 2012-10-02 ENCOUNTER — Telehealth (HOSPITAL_COMMUNITY): Payer: Self-pay | Admitting: Emergency Medicine

## 2012-10-03 ENCOUNTER — Telehealth: Payer: Self-pay

## 2012-10-03 ENCOUNTER — Other Ambulatory Visit: Payer: Self-pay | Admitting: Gastroenterology

## 2012-10-03 NOTE — Telephone Encounter (Signed)
Spoke with pt

## 2012-10-03 NOTE — Telephone Encounter (Signed)
See addendum to office note.

## 2012-10-03 NOTE — Progress Notes (Signed)
Pt is aware. She will be on the abx for a week. She will start them today. Leigann, please schedule HBT.

## 2012-10-03 NOTE — Progress Notes (Signed)
Patient is scheduled for HBT on 4/13 at 8:30 am and I have mailed her the instructions

## 2012-10-03 NOTE — Telephone Encounter (Signed)
Pt called- she is c/o diarrhea and vomiting yesterday. She has not had any this morning but she does have bloating and is miserable. No fever. She is taking the zofran and it seems to help some. She wants to know the results from the last GES she had done at [facility] and she wants to know what the next step is. Please advise.  She also stated she got a call from the hospital yesterday informing her that she has a UTI and they sent in abx for her but she hasnt gotten them yet.

## 2012-10-03 NOTE — Progress Notes (Signed)
Reviewed records from Dr. Allena Katz. 1. Gastric emptying study x4 all normal. Done 09/22/2012, 05/01/2011, 08/14/2008, 03/25/2006. 2. CT of the abdomen and pelvis with contrast on 09/09/2012 showed mild fatty infiltration of the liver but otherwise unremarkable. 3. Colonoscopy on 08/05/2011 showed mild diverticulosis, 6 mm cecal polyp (hyperplastic). 4. EGD on 05/07/2011 showed hiatal hernia (5 cm), moderate antral gastritis with erosions (biopsy showed mild chronic gastritis without H. Pylori. 5. Upper GI series with small bowel follow-through on 08/17/18 in showed transit time of 30 minutes, ileum somewhat thin and ribbonlike but otherwise unremarkable. 6. EGD on 08/16/2008 showed gastritis (minimal chronic duodenal minus and mild chronic gastritis on biopsy without evidence of celiac).  Please let patient know she does not have significant gastroparesis. Really her gastric emptying has been good on total of four studies done in the last 7 years.   She should continue Dexilant. Do not overeat, eat 5-6 small meals daily due to size of her hiatal hernia and bad GERD.  No evidence of celiac on prior duodenal biopsy.  She should take antibiotic for UTI as prescribed.  I would like for her to have a Hydrogen Breath Test but this cannot be done while on antibiotics so we will need to postpone.  I need to discuss her case with Dr. Jena Gauss since we have received all of her records. She may need repeat colonoscopy with random biopsies to further evaluate her diarrhea.

## 2012-10-10 ENCOUNTER — Encounter: Payer: Self-pay | Admitting: Gastroenterology

## 2012-10-10 NOTE — Progress Notes (Addendum)
Please cancel the HBT planned for 10/13/12. Patient will not have been off antibiotics long enough. I am still waiting on further input from Dr. Darrick Penna.

## 2012-10-10 NOTE — Progress Notes (Signed)
Chloe Golden,  Per [facility].  EGD with duodenal BX and TCS with random colon BX. Use Moviprep.  Cancel HBT.

## 2012-10-10 NOTE — Progress Notes (Signed)
RECOMMEND REPEAT EGD WITH DUODENAL BX AND TCS WITH RANDOM COLON BX. USE MOVIPREP.

## 2012-10-11 ENCOUNTER — Other Ambulatory Visit: Payer: Self-pay | Admitting: Gastroenterology

## 2012-10-11 DIAGNOSIS — R1319 Other dysphagia: Secondary | ICD-10-CM

## 2012-10-11 DIAGNOSIS — K449 Diaphragmatic hernia without obstruction or gangrene: Secondary | ICD-10-CM

## 2012-10-11 DIAGNOSIS — K219 Gastro-esophageal reflux disease without esophagitis: Secondary | ICD-10-CM

## 2012-10-11 DIAGNOSIS — R140 Abdominal distension (gaseous): Secondary | ICD-10-CM

## 2012-10-11 DIAGNOSIS — R635 Abnormal weight gain: Secondary | ICD-10-CM

## 2012-10-11 DIAGNOSIS — R1310 Dysphagia, unspecified: Secondary | ICD-10-CM

## 2012-10-11 MED ORDER — PEG-KCL-NACL-NASULF-NA ASC-C 100 G PO SOLR: 1.0000 | ORAL | Status: DC

## 2012-10-11 NOTE — Progress Notes (Signed)
Patient is scheduled with [facility] on 05/09 and I have spoken to her and mailed her the instructions

## 2012-10-12 ENCOUNTER — Ambulatory Visit (HOSPITAL_COMMUNITY): Admission: RE | Admit: 2012-10-12 | Payer: Medicare Other | Source: Ambulatory Visit | Admitting: Gastroenterology

## 2012-10-12 ENCOUNTER — Encounter (HOSPITAL_COMMUNITY): Admission: RE | Payer: Self-pay | Source: Ambulatory Visit

## 2012-10-12 SURGERY — BREATH TEST, FOR INTESTINAL BACTERIAL OVERGROWTH

## 2012-10-14 ENCOUNTER — Encounter (HOSPITAL_COMMUNITY): Payer: Self-pay | Admitting: Pharmacy Technician

## 2012-10-28 ENCOUNTER — Encounter (HOSPITAL_COMMUNITY)
Admission: RE | Disposition: A | Discharge status: Home / Self Care | Payer: Self-pay | Source: Ambulatory Visit | Attending: Gastroenterology

## 2012-10-28 ENCOUNTER — Encounter (HOSPITAL_COMMUNITY): Payer: Self-pay | Admitting: *Deleted

## 2012-10-28 ENCOUNTER — Ambulatory Visit (HOSPITAL_COMMUNITY)
Admission: RE | Admit: 2012-10-28 | Discharge: 2012-10-28 | Disposition: A | Discharge status: Home / Self Care | Payer: Medicare Other | Source: Ambulatory Visit | Attending: Gastroenterology | Admitting: Gastroenterology

## 2012-10-28 DIAGNOSIS — K2960 Other gastritis without bleeding: Secondary | ICD-10-CM | POA: Insufficient documentation

## 2012-10-28 DIAGNOSIS — R635 Abnormal weight gain: Secondary | ICD-10-CM

## 2012-10-28 DIAGNOSIS — K5900 Constipation, unspecified: Secondary | ICD-10-CM

## 2012-10-28 DIAGNOSIS — K2990 Gastroduodenitis, unspecified, without bleeding: Secondary | ICD-10-CM

## 2012-10-28 DIAGNOSIS — R142 Eructation: Secondary | ICD-10-CM | POA: Insufficient documentation

## 2012-10-28 DIAGNOSIS — R109 Unspecified abdominal pain: Secondary | ICD-10-CM | POA: Insufficient documentation

## 2012-10-28 DIAGNOSIS — R140 Abdominal distension (gaseous): Secondary | ICD-10-CM

## 2012-10-28 DIAGNOSIS — K6389 Other specified diseases of intestine: Secondary | ICD-10-CM | POA: Insufficient documentation

## 2012-10-28 DIAGNOSIS — R110 Nausea: Secondary | ICD-10-CM | POA: Insufficient documentation

## 2012-10-28 DIAGNOSIS — K648 Other hemorrhoids: Secondary | ICD-10-CM | POA: Insufficient documentation

## 2012-10-28 DIAGNOSIS — K5730 Diverticulosis of large intestine without perforation or abscess without bleeding: Secondary | ICD-10-CM | POA: Insufficient documentation

## 2012-10-28 DIAGNOSIS — K2970 Gastritis, unspecified, without bleeding: Secondary | ICD-10-CM

## 2012-10-28 DIAGNOSIS — R1310 Dysphagia, unspecified: Secondary | ICD-10-CM

## 2012-10-28 DIAGNOSIS — I10 Essential (primary) hypertension: Secondary | ICD-10-CM | POA: Insufficient documentation

## 2012-10-28 DIAGNOSIS — K449 Diaphragmatic hernia without obstruction or gangrene: Secondary | ICD-10-CM

## 2012-10-28 DIAGNOSIS — K2980 Duodenitis without bleeding: Secondary | ICD-10-CM | POA: Insufficient documentation

## 2012-10-28 DIAGNOSIS — K219 Gastro-esophageal reflux disease without esophagitis: Secondary | ICD-10-CM

## 2012-10-28 DIAGNOSIS — R1319 Other dysphagia: Secondary | ICD-10-CM

## 2012-10-28 DIAGNOSIS — R197 Diarrhea, unspecified: Secondary | ICD-10-CM

## 2012-10-28 DIAGNOSIS — R141 Gas pain: Secondary | ICD-10-CM | POA: Insufficient documentation

## 2012-10-28 HISTORY — PX: COLONOSCOPY WITH ESOPHAGOGASTRODUODENOSCOPY (EGD): SHX5779

## 2012-10-28 HISTORY — PX: BIOPSY: SHX5522

## 2012-10-28 HISTORY — PX: ESOPHAGOGASTRODUODENOSCOPY (EGD) WITH ESOPHAGEAL DILATION: SHX5812

## 2012-10-28 SURGERY — COLONOSCOPY WITH ESOPHAGOGASTRODUODENOSCOPY (EGD)
Anesthesia: Moderate Sedation

## 2012-10-28 MED ORDER — MIDAZOLAM HCL 5 MG/5ML IJ SOLN
INTRAMUSCULAR | Status: AC
  Filled 2012-10-28: qty 10

## 2012-10-28 MED ORDER — LUBIPROSTONE 8 MCG PO CAPS: ORAL_CAPSULE | ORAL | Status: DC

## 2012-10-28 MED ORDER — MINERAL OIL PO OIL
TOPICAL_OIL | ORAL | Status: AC
  Filled 2012-10-28: qty 30

## 2012-10-28 MED ORDER — SODIUM CHLORIDE 0.9 % IV SOLN
INTRAVENOUS | Status: DC
  Administered 2012-10-28: 09:00:00 via INTRAVENOUS

## 2012-10-28 MED ORDER — MEPERIDINE HCL 100 MG/ML IJ SOLN
INTRAMUSCULAR | Status: AC
  Filled 2012-10-28: qty 1

## 2012-10-28 MED ORDER — MIDAZOLAM HCL 5 MG/5ML IJ SOLN
INTRAMUSCULAR | Status: DC | PRN
  Administered 2012-10-28: 1 mg via INTRAVENOUS
  Administered 2012-10-28 (×2): 2 mg via INTRAVENOUS
  Administered 2012-10-28: 1 mg via INTRAVENOUS

## 2012-10-28 MED ORDER — STERILE WATER FOR IRRIGATION IR SOLN
Status: DC | PRN
  Administered 2012-10-28: 10:00:00

## 2012-10-28 MED ORDER — MEPERIDINE HCL 100 MG/ML IJ SOLN
INTRAMUSCULAR | Status: DC | PRN
  Administered 2012-10-28 (×2): 25 mg via INTRAVENOUS
  Administered 2012-10-28: 50 mg via INTRAVENOUS
  Administered 2012-10-28: 25 mg via INTRAVENOUS

## 2012-10-28 NOTE — H&P (Addendum)
Primary Care Physician:  Carlota Raspberry, MD Primary Gastroenterologist:  Dr. Darrick Penna  Pre-Procedure History & Physical: HPI:  Chloe Golden is a 67 y.o. female here for abdominal pain, bloating, nausea/constipation/diarrhea.   Past Medical History  Diagnosis Date  . Acid reflux   . Hypertension   . Neuropathy   . Arthritis   . Rheumatoid arthritis   . Degenerative arthritis of cervical spine   . Fibromyalgia     Past Surgical History  Procedure Laterality Date  . Cholecystectomy    . Abdominal hysterectomy    . Carpal tunnel release    . Bladder surgery      Prior to Admission medications   Medication Sig Start Date End Date Taking? Authorizing Provider  bismuth subsalicylate (PEPTO BISMOL) 262 MG chewable tablet Chew 524 mg by mouth 4 (four) times daily -  before meals and at bedtime.   Yes Historical Provider, MD  CARAFATE 1 GM/10ML suspension Take 1 g by mouth.  08/22/12  Yes Historical Provider, MD  dexlansoprazole (DEXILANT) 60 MG capsule Take 60 mg by mouth every morning.   Yes Historical Provider, MD  docusate sodium (COLACE) 100 MG capsule Take 100 mg by mouth every morning.   Yes Historical Provider, MD  Eszopiclone (ESZOPICLONE) 3 MG TABS Take 3 mg by mouth at bedtime. Take immediately before bedtime   Yes Historical Provider, MD  gabapentin (NEURONTIN) 300 MG capsule Take 300-600 mg by mouth 3 (three) times daily. Two capsules taken in the morning, One capsule at lunch, and two capsules with the evening meal   Yes Historical Provider, MD  losartan-hydrochlorothiazide (HYZAAR) 50-12.5 MG per tablet Take 1 tablet by mouth every morning.   Yes Historical Provider, MD  nebivolol (BYSTOLIC) 5 MG tablet Take 5 mg by mouth at bedtime.   Yes Historical Provider, MD  ondansetron (ZOFRAN) 4 MG tablet Take 1 tablet (4 mg total) by mouth every 8 (eight) hours as needed for nausea. 09/28/12  Yes Tiffany Kocher, PA-C  peg 3350 powder (MOVIPREP) 100 G SOLR Take 1 kit (100 g total) by  mouth as directed. PHARMACIST USE THE FOLLOWING FOR PATIENT DISCOUNT BIN: [id number] GROUP: [id number] ID: [id number] PATIENTS WILL SAVE UP TO $10 ON THEIR OUT-OF-POCKET EXPENSE FOR PROCESSING QUESTIONS, CALL [phone] 10/11/12  Yes West Bali, MD  Probiotic Product (PHILLIPS COLON HEALTH PO) Take 1 capsule by mouth every morning.   Yes Historical Provider, MD  sertraline (ZOLOFT) 100 MG tablet Take 100 mg by mouth every morning.   Yes Historical Provider, MD  silodosin (RAPAFLO) 8 MG CAPS capsule Take 8 mg by mouth every morning.   Yes Historical Provider, MD  tiZANidine (ZANAFLEX) 4 MG tablet Take 4 mg by mouth at bedtime.   Yes Historical Provider, MD  traMADol (ULTRAM) 50 MG tablet Take 50 mg by mouth 2 (two) times daily as needed for pain.   Yes Historical Provider, MD    Allergies as of 10/11/2012 - Review Complete 09/28/2012  Allergen Reaction Noted  . Ciprofloxacin Nausea And Vomiting and Other (See Comments) 09/26/2012  . Codeine Other (See Comments) 09/26/2012  . Ibuprofen Other (See Comments) 09/26/2012  . Valium (diazepam) Other (See Comments) 09/26/2012    Family History  Problem Relation Age of Onset  . Lung cancer Father   . Colon cancer Neg Hx   . Crohn's disease Cousin   . Celiac disease Neg Hx     History   Social History  . Marital Status: Single  Spouse Name: N/A    Number of Children: N/A  . Years of Education: N/A   Occupational History  . Not on file.   Social History Main Topics  . Smoking status: Never Smoker   . Smokeless tobacco: Not on file  . Alcohol Use: No  . Drug Use: No  . Sexually Active: Not on file   Other Topics Concern  . Not on file   Social History Narrative   1 living   2 deceased    Review of Systems: See HPI, otherwise negative ROS   Physical Exam: BP 126/73  Pulse 66  Temp(Src) 98.1 F (36.7 C) (Oral)  Resp 18  Ht 5\' 4"  (1.626 m)  Wt 198 lb (89.812 kg)  BMI 33.97 kg/m2  SpO2 96% General:   Alert,   pleasant and cooperative in NAD Head:  Normocephalic and atraumatic. Neck:  Supple; Lungs:  Clear throughout to auscultation.    Heart:  Regular rate and rhythm. Abdomen:  Soft, nontender and nondistended. Normal bowel sounds, without guarding, and without rebound.   Neurologic:  Alert and  oriented x4;  grossly normal neurologically.  Impression/Plan:     ABDOMINAL  PAIN/dyspepsia  PLAN: 1. EGD/TCS TODAY

## 2012-10-28 NOTE — Op Note (Signed)
[facility] [address] [place], [address]   COLONOSCOPY PROCEDURE REPORT  PATIENT: Chloe, Golden  MR#: [id number] BIRTHDATE: [birth year] , 67  yrs. old GENDER: Female ENDOSCOPIST: Jonette Eva, MD REFERRED BY:   Carlota Raspberry, MD PROCEDURE DATE:  10/28/2012 PROCEDURE:   Colonoscopy with biopsy INDICATIONS:Bloating.  PMHx: CONSTIPATAION  RX LAXATIVES FOLLOWED BY DIARRHEA MEDICATIONS: Demerol 100 mg IV and Versed 5 mg IV  DESCRIPTION OF PROCEDURE:    Physical exam was performed.  Informed consent was obtained from the patient after explaining the benefits, risks, and alternatives to procedure.  The patient was connected to monitor and placed in left lateral position. Continuous oxygen was provided by nasal cannula and IV medicine administered through an indwelling cannula.  After administration of sedation and rectal exam, the patients rectum was intubated and the     colonoscope was advanced under direct visualization to the ileum.  The scope was removed slowly by carefully examining the color, texture, anatomy, and integrity mucosa on the way out.  The patient was recovered in endoscopy and discharged home in satisfactory condition.  NO IMAGES AVAILABLE DUE TO TECHNICAL DIFFICULTIES     COLON FINDINGS: The mucosa appeared normal in the terminal ileum.  , There was moderate diverticulosis noted in the descending colon and sigmoid colon with associated muscular hypertrophy.  MLEANOSIS COLI The colon was otherwise normal. RANDOM BIOPSIES OBTAINED TO EVALUATE FOR MICROSCOPIC OLITIS. ANGULATED RECTO-SUGMOID JUNCTION. There was NO inflammation, polyps or cancers unless previously stated.  , and Small internal hemorrhoids were found.  PREP QUALITY: good. CECAL W/D TIME: 13 minutes  COMPLICATIONS: None  ENDOSCOPIC IMPRESSION: 1.   Moderate diverticulosis in the descending colon and sigmoid colon 2.   Small internal hemorrhoids  RECOMMENDATIONS: AWAIT  BIOPSY HIGH FIBER DIET DRINK WATER TCS IN 10 YEARS OPV IN 4 MOS       _______________________________ eSignedJonette Eva, MD 10/28/2012 10:37 AM

## 2012-10-28 NOTE — Op Note (Signed)
[facility] [address] [place], [address]   ENDOSCOPY PROCEDURE REPORT  PATIENT: Chloe, Golden  MR#: [id number] BIRTHDATE: 06-20-1946 , 67  yrs. old GENDER: Female  ENDOSCOPIST: Jonette Eva, MD REFFERED BY:   Carlota Raspberry, MD  PROCEDURE DATE:  10/28/2012 PROCEDURE:   EGD with biopsy and EGD with dilatation over guidewire   INDICATIONS:1.  bloating.   2.  constipation followed by diarrhea AFTER LAXATIVES. 3. DYSPHAGIA MEDICATIONS: TCS+ Demerol 25 mg IV and Versed 1mg  IV TOPICAL ANESTHETIC: Cetacaine Spray  DESCRIPTION OF PROCEDURE:   After the risks benefits and alternatives of the procedure were thoroughly explained, informed consent was obtained.  The     endoscope was introduced through the mouth and advanced to the second portion of the duodenum. The instrument was slowly withdrawn as the mucosa was carefully examined.  Prior to withdrawal of the scope, the guidwire was placed.  The esophagus was dilated successfully.  The patient was recovered in endoscopy and discharged home in satisfactory condition.      ESOPHAGUS: The mucosa of the esophagus appeared normal.   STOMACH: Mild non-erosive gastritis (inflammation) with hemorrhage was found in the gastric antrum.  Multiple biopsies were performed. DUODENUM: MILD DUODENITIS IN THE BULB. The duodenal mucosa showed no abnormalities in the second portion of the duodenum.  Cold forcep biopsies were taken in the second portion.  Dilation was then performed at the proximal esophagus Dilator: Savary over guidewire Size(s): 15-16 MM Resistance: moderate Heme: none COMPLICATIONS: There were no complications.  ENDOSCOPIC IMPRESSION: 1.   PT MOST LIKELY HAD A PROXIMAL CERVIAL WEB, S/P DILATION 2.   MILD Non-erosive gastritis & DUODENITIS  RECOMMENDATIONS: CONTINUE DEXILANT. DRINK WATER FOLLOW A HIGH FIBER/LOW FAT DIET.  AVOID ITEMS THAT CAUSE BLOATING.  BIOPSY WILL BE BACK IN 7 DAYS.  IF NO H  PYLORI, CELIAC SPRUE, OR MICROSCOPIC COLITIS, PT NEEDS HYDROGEN BREATH TEST FOR SIBO. STOP CARAFATE AND PEPTO BISMOL Follow up in 4 mos.      _______________________________ Rosalie DoctorJonette Eva, MD 10/28/2012 10:46 AM

## 2012-11-01 ENCOUNTER — Encounter (HOSPITAL_COMMUNITY): Payer: Self-pay | Admitting: Gastroenterology

## 2012-11-04 ENCOUNTER — Telehealth: Payer: Self-pay | Admitting: Gastroenterology

## 2012-11-04 NOTE — Telephone Encounter (Signed)
Patient needs hydrogen breath test per [facility] plan. Her bx are unremarkable.  Make sure she meets rule of avoidance of antibiotics time frame as per HBT protocol.

## 2012-11-04 NOTE — Telephone Encounter (Signed)
Called and told pt. She said she last had antibiotics for UTI when she went to the ED. She is aware that Soledad Gerlach will call her on mon to schedule.

## 2012-11-04 NOTE — Telephone Encounter (Signed)
I called pt. She said she is eating small portions of food. Still has a lot of nausea in the AM and the PM. She hates to take nausea medicine it puts her out of commission. Said she is so bloated she is miserable. She is having regular BM's daily, but doesn;t always feel that she empties completely. Please advise!

## 2012-11-04 NOTE — Telephone Encounter (Signed)
Patient is asking for an antibiotic, she is hurting, and everything she eats is blowing her stomach up and she is needing relief, Please advise?

## 2012-11-07 ENCOUNTER — Encounter (HOSPITAL_COMMUNITY): Payer: Self-pay | Admitting: Pharmacy Technician

## 2012-11-07 ENCOUNTER — Other Ambulatory Visit: Payer: Self-pay | Admitting: Gastroenterology

## 2012-11-07 NOTE — Telephone Encounter (Signed)
Patient is scheduled for Wednesday May 21st and she is aware

## 2012-11-07 NOTE — Telephone Encounter (Signed)
Pt called to see if she can tet the Hydrogen Breath Test scheduled. She is home now and will not be there long.

## 2012-11-08 NOTE — Telephone Encounter (Signed)
PT AWARE OF RESULTS. AWAIT HBT. [facility]PV SEP 2014 E30 DIARRHEA, BLOATING, GERD

## 2012-11-09 ENCOUNTER — Ambulatory Visit (HOSPITAL_COMMUNITY)
Admission: RE | Admit: 2012-11-09 | Discharge: 2012-11-09 | Disposition: A | Discharge status: Home / Self Care | Payer: Medicare Other | Source: Ambulatory Visit | Attending: Gastroenterology | Admitting: Gastroenterology

## 2012-11-09 ENCOUNTER — Encounter (HOSPITAL_COMMUNITY): Payer: Self-pay

## 2012-11-09 ENCOUNTER — Encounter (HOSPITAL_COMMUNITY)
Admission: RE | Disposition: A | Discharge status: Home / Self Care | Payer: Self-pay | Source: Ambulatory Visit | Attending: Gastroenterology

## 2012-11-09 DIAGNOSIS — R110 Nausea: Secondary | ICD-10-CM

## 2012-11-09 DIAGNOSIS — R141 Gas pain: Secondary | ICD-10-CM | POA: Insufficient documentation

## 2012-11-09 DIAGNOSIS — R143 Flatulence: Secondary | ICD-10-CM | POA: Insufficient documentation

## 2012-11-09 DIAGNOSIS — R142 Eructation: Secondary | ICD-10-CM

## 2012-11-09 DIAGNOSIS — R197 Diarrhea, unspecified: Secondary | ICD-10-CM | POA: Insufficient documentation

## 2012-11-09 HISTORY — PX: BACTERIAL OVERGROWTH TEST: SHX5739

## 2012-11-09 HISTORY — DX: Diaphragmatic hernia without obstruction or gangrene: K44.9

## 2012-11-09 SURGERY — BREATH TEST, FOR INTESTINAL BACTERIAL OVERGROWTH

## 2012-11-09 MED ORDER — LACTULOSE 10 GM/15ML PO SOLN
30.0000 g | Freq: Once | ORAL | Status: AC
  Administered 2012-11-09: 30 g via ORAL

## 2012-11-09 MED ORDER — LACTULOSE 10 GM/15ML PO SOLN
ORAL | Status: AC
  Filled 2012-11-09: qty 60

## 2012-11-09 NOTE — Progress Notes (Signed)
No beans, bran or high fiber cereal the day before the procedure? no NPO except for water 12 hours before procedure? yes No smoking, sleeping or vigorous exercising for at least 30 before procedure? no Recent antibiotic use and/or diarrhea? no   If yes, physician notified.  Time Baseline 15 mins 30 mins 45 mins 60 mins 75 mins 90 mins 105 mins 120 mins 135 mins 150 mins 165 mins 180 mins  H2-ppm 1 2  4  6 7  4 3 4 4 4 6 9  3

## 2012-11-10 ENCOUNTER — Encounter (HOSPITAL_COMMUNITY): Payer: Self-pay | Admitting: Gastroenterology

## 2012-11-15 ENCOUNTER — Telehealth: Payer: Self-pay | Admitting: Gastroenterology

## 2012-11-15 NOTE — Telephone Encounter (Signed)
She is wanting her HBT results and I don't have those. I am not sure if SLF has read them.   I'm not sure if I can add anything, we may have to wait till SLF back.  Doris, please find out more info. She has had chronic bloating. See if she is eating/drinking to maintain hydration. See what GI meds she is on.

## 2012-11-15 NOTE — Telephone Encounter (Signed)
I spoke to Dr. Darrick Penna, who said we did let the pt know her biopsies were ok before we scheduled the hydrogen breath test. She does not have the resutls of the HBT but will try to review tomorrow and give pt a call or let me know to call pt.

## 2012-11-15 NOTE — Telephone Encounter (Signed)
Pt said everthing she eats bloats her. She had a  Cheese sandwich yesterday and a piece of toast last night and she was extremely bloated this AM. She said she is drinking lots of liquids to stay hydrated.   She wants results from all of her tests, TCS, EGD, HBT. Said she is not any better from when she was seen.   She is taking the Dexilant 60 mg daily, but feels the Nexium might have helped a little more that the Dexilant.   She has a BM about every other day. She takes the Amitiza 8 mcg bid. She is taking Colace qd.York Spaniel she would need four a day to help her).  She took a stool softner with stimulant laxative last night but not any results.  She takes the Zofran only as needed.

## 2012-11-15 NOTE — Telephone Encounter (Signed)
Routing to Tana Coast, PA in Dr. Evelina Dun absence.

## 2012-11-15 NOTE — Telephone Encounter (Signed)
Patient is calling asking for Procedure results and she is having a lot of bloating and she cant eat or drink due to the bloating and discomfort, please advise

## 2012-11-16 NOTE — Brief Op Note (Signed)
11/09/2012  2:22 PM  PATIENT:  Chloe Golden  67 y.o. female  PRE-OPERATIVE DIAGNOSIS:  Nausea and Bloating  POST-OPERATIVE DIAGNOSIS: NORMAL HYDROGEN BREATH TEST  PROCEDURE:  Procedure(s) with comments: BACTERIAL OVERGROWTH TEST (N/A) - 7:30 Iseah Plouff Rolly Salter, MD  MEDICATIONS USED:  LACTULOSE  FINDINGS: BREATHALYZER- O MIN(1 PPM), FIRST PEAK (7 PPM, 600 MINS), TROUGH (3 PPM 90 MINS), SECOND PEAK (9 PPM, 165 MINS)  DIAGNOSIS: NO EVIDENCE FOR SMALL BOWEL BACTERIAL OVERGROWTH. Differential diagnosis includes PT IS A METHANE PRODUCER.  PLAN OF CARE:  1. PROBIOTIC DAILY 2. REFER TO [facility] FOR FUNCTIONAL GUT DISORDER EVALAUATION

## 2012-11-16 NOTE — Telephone Encounter (Addendum)
PLEASE CALL PT. HER HYDROGEN BREATH TEST IS NORMAL. I HAVE REVIEWED HER LIST OF MEDICATIONS AND NONE OF THEM SHOULD CAUSE BLOATING BUT CAN CONTRIBUTE TO NAUSEA. SHE NEEDS TO ADD ALIGN DAILY.SHE HAS HAD AN EXTENSIVE GI EVALUATION AND NO OTHER DIAGNOSIS HAS BEEN FOUND. 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  SHE NEEDS TO BE SEEN AT [facility] FOR AN EVALUATION. SHE NEEDS TO SEE A GI DOCTOR WHO SPECIALIZES IN FUNCTIONAL GUT DISORDERS TO SEE IF THERE IS ANYTHING ELSE WE CAN DO TO IMPROVE HER SYMPTOMS.  SHE SHOULD AVOID DAIRY IN  HER DIET FOR THE NEXT MONTH. SHE WILL HAVE A FOLLOW UP APPT TO SEE DR. FIELDS IN JUL 2014 E30 BLOATING, NAUSEA. SHE SHOULD CONTINUE DEXILANT, AMITIZA, COLACE.    Lactose Free Diet Lactose is a carbohydrate that is found mainly in milk and milk products, as well as in foods with added milk or whey. Lactose must be digested by the enzyme in order to be used by the body. Lactose intolerance occurs when there is a shortage of lactase. When your body is not able to digest lactose, you may feel sick to your stomach (nausea), bloating, cramping, gas and diarrhea.   Tolerance to lactose varies widely There are many dairy products that may be tolerated better than milk by some people:  The use of cultured dairy products such as yogurt, buttermilk, cottage cheese, and sweet acidophilus milk (Kefir) for lactase-deficient individuals is usually well tolerated. This is because the healthy bacteria help digest lactose.   Lactose-hydrolyzed milk (Lactaid) contains 40-90% less lactose than milk and may also be well tolerated.  SPECIAL NOTES  Lactose is a carbohydrates. The major food source is dairy products. Reading food labels is important. Many products contain lactose even when they are not made from milk. Look for the following words: whey, milk solids, dry milk solids, nonfat dry milk powder. Typical sources of lactose other than dairy products include breads, candies, cold cuts,  prepared and processed foods, and commercial sauces and gravies.   All foods must be prepared without milk, cream, or other dairy foods.   Soy milk and lactose-free supplements (LACTASE) may be used as an alternative to milk.    FOOD GROUP ALLOWED/RECOMMENDED AVOID/USE SPARINGLY  BREADS / STARCHES 4 servings or more* Breads and rolls made without milk. [place], [place], or [place] bread. Breads and rolls that contain milk. Prepared mixes such as muffins, biscuits, waffles, pancakes. Sweet rolls, donuts, [place] toast (if made with milk or lactose).  Crackers: Soda crackers, graham crackers. Any crackers prepared without lactose. Zwieback crackers, corn curls, or any that contain lactose.  Cereals: Cooked or dry cereals prepared without lactose (read labels). Cooked or dry cereals prepared with lactose (read labels). Total, Cocoa Krispies. Special K.  Potatoes / Pasta / Rice: Any prepared without milk or lactose. Popcorn. Instant potatoes, frozen [place] fries, scalloped or au gratin potatoes.  VEGETABLES 2 servings or more Fresh, frozen, and canned vegetables. Creamed or breaded vegetables. Vegetables in a cheese sauce or with lactose-containing margarines.  FRUIT 2 servings or more All fresh, canned, or frozen fruits that are not processed with lactose. Any canned or frozen fruits processed with lactose.  MEAT & SUBSTITUTES 2 servings or more (4 to 6 oz. total per day) Plain beef, chicken, fish, [place], lamb, veal, pork, or ham. Kosher prepared meat products. Strained or junior meats that do not contain milk. Eggs, soy meat substitutes, nuts. Scrambled eggs, omelets, and souffles that contain milk. Creamed  or breaded meat, fish, or fowl. Sausage products such as wieners, liver sausage, or cold cuts that contain milk solids. Cheese, cottage cheese, or cheese spreads.  MILK None. (See "BEVERAGES" for milk substitutes. See "DESSERTS" for ice cream and frozen desserts.) Milk (whole, 2%, skim, or  chocolate). Evaporated, powdered, or condensed milk; malted milk.  SOUPS & COMBINATION FOODS Bouillon, broth, vegetable soups, clear soups, consomms. Homemade soups made with allowed ingredients. Combination or prepared foods that do not contain milk or milk products (read labels). Cream soups, chowders, commercially prepared soups containing lactose. Macaroni and cheese, pizza. Combination or prepared foods that contain milk or milk products.  DESSERTS & SWEETS In moderation Water and fruit ices; gelatin; angel food cake. Homemade cookies, pies, or cakes made from allowed ingredients. Pudding (if made with water or a milk substitute). Lactose-free tofu desserts. Sugar, honey, corn syrup, jam, jelly; marmalade; molasses (beet sugar); Pure sugar candy; marshmallows. Ice cream, ice milk, sherbet, custard, pudding, frozen yogurt. Commercial cake and cookie mixes. Desserts that contain chocolate. Pie crust made with milk-containing margarine; reduced-calorie desserts made with a sugar substitute that contains lactose. Toffee, peppermint, butterscotch, chocolate, caramels.  FATS & OILS In moderation Butter (as tolerated; contains very small amounts of lactose). Margarines and dressings that do not contain milk, Vegetable oils, shortening, Miracle Whip, mayonnaise, nondairy cream & whipped toppings without lactose or milk solids added (examples: Coffee Rich, Carnation Coffeemate, Rich's Whipped Topping, PolyRich). Tomasa Blase. Margarines and salad dressings containing milk; cream, cream cheese; peanut butter with added milk solids, sour cream, chip dips, made with sour cream.  BEVERAGES Carbonated drinks; tea; coffee and freeze-dried coffee; some instant coffees (check labels). Fruit drinks; fruit and vegetable juice; Rice or Soy milk. Ovaltine, hot chocolate. Some cocoas; some instant coffees; instant iced teas; powdered fruit drinks (read labels).   CONDIMENTS / MISCELLANEOUS Soy sauce, carob powder, olives,  gravy made with water, baker's cocoa, pickles, pure seasonings and spices, wine, pure monosodium glutamate, catsup, mustard. Some chewing gums, chocolate, some cocoas. Certain antibiotics and vitamin / mineral preparations. Spice blends if they contain milk products. MSG extender. Artificial sweeteners that contain lactose such as Equal (Nutra-Sweet) and Sweet 'n Low. Some nondairy creamers (read labels).  SAMPLE MENU*  Breakfast   Orange Juice.  Banana.   Bran flakes.   Nondairy Creamer.  Vienna Bread (toasted).   Butter or milk-free margarine.   Coffee or tea.    Noon Meal   Chicken Breast.  Rice.   Green beans.   Butter or milk-free margarine.  Fresh melon.   Coffee or tea.    Evening Meal   Roast Beef.  Baked potato.   Butter or milk-free margarine.   Broccoli.   Lettuce salad with vinegar and oil dressing.  [employer].   Coffee or tea.

## 2012-11-16 NOTE — Telephone Encounter (Signed)
I called and informed pt. She said she has been taking colon health for a couple of years, but has not taken any for the last month. She will get some and start again. I am mailing her the Lactose Free Diet Dr. Darrick Penna has. She said to tell Soledad Gerlach when she makes her referral to make them aware that she will be out of town June 26th - July 10.

## 2012-11-17 ENCOUNTER — Other Ambulatory Visit: Payer: Self-pay | Admitting: Gastroenterology

## 2012-11-17 DIAGNOSIS — K929 Disease of digestive system, unspecified: Secondary | ICD-10-CM

## 2012-11-17 NOTE — Telephone Encounter (Signed)
Referral has been faxed to [facility] they will have the appointment scheduled with the patient

## 2012-11-18 ENCOUNTER — Encounter: Payer: Self-pay | Admitting: Gastroenterology

## 2012-11-18 NOTE — Telephone Encounter (Signed)
Pt is aware of OV 7/17 at 10 with SF and appt card was mailed

## 2012-11-23 NOTE — Telephone Encounter (Signed)
Patient is scheduled to see Dr. Shawnie Pons on 03/06/13 at 9:30 am

## 2012-11-29 ENCOUNTER — Telehealth: Payer: Self-pay | Admitting: Gastroenterology

## 2012-11-29 NOTE — Telephone Encounter (Signed)
Patient is on the waiting list.

## 2012-11-29 NOTE — Telephone Encounter (Signed)
Pt returned call and was informed. She has a copy of the diet. Routing to Soledad Gerlach in reference to an earlier appt.

## 2012-11-29 NOTE — Telephone Encounter (Signed)
Pt is asking if we can get her into [facility] sooner than 03-06-2013. She said she can not suffer 3 more months to be seen at [facility]. Please advise. Also, she is asking about taking Gas X for her bloating. Please advise. [phone]

## 2012-11-29 NOTE — Telephone Encounter (Addendum)
PLEASE CALL PT. Pt. SHE HAS A FUNCTIONAL GUT DISORDER. SHE MAY USE GAS-X. WE WILL CALL [facility] TO GET HER ON THE CANCELLATION LIST SO IF A PT CANCELS SHE CAN BE SEEN SOONER. IF SHE IS HAVING  A LOT OF BLOATING AND GAS SHE SHOULD FOLLOW A LACTOSE FREE/FULL LIQUID DIET FOR 3 DAYS.  Full Liquid Diet A high-calorie, high-protein supplement should be used to meet your nutritional requirements when the full liquid diet is continued for more than 2 or 3 days. If this diet is to be used for an extended period of time (more than 7 days), a multivitamin should be considered.  Breads and Starches  Allowed: None are allowed except crackersWHOLE OR pureed (made into a thick, smooth soup) in soup. Cooked, refined corn, oat, rice, rye, and wheat cereals are also allowed.   Avoid: Any others.    Potatoes/Pasta/Rice  Allowed: ANY ITEM AS A SOUP OR SMALL PLATE OF MASHED POTATOES OR RICE.       Vegetables  Allowed: Strained tomato or vegetable juice. Vegetables pureed in soup.   Avoid: Any others.    Fruit  Allowed: Any strained fruit juices and fruit drinks. Include 1 serving of citrus or vitamin C-enriched fruit juice daily.   Avoid: Any others.  Meat and Meat Substitutes  Allowed: Egg  Avoid: Any meat, fish, or fowl. All cheese.  Milk  Allowed: SOY Milk beverages, including milk shakes and instant breakfast mixes. Smooth yogurt.   Avoid: Any others. Avoid dairy products if not tolerated.    Soups and Combination Foods  Allowed: Broth, strained cream soups. Strained, broth-based soups.   Avoid: Any others.    Desserts and Sweets  Allowed: flavored gelatin, tapioca, plain LACTOSE FREE ice cream, sherbet, smooth pudding, junket, fruit ices, frozen ice pops, pudding pops,, frozen fudge pops, chocolate syrup. Sugar, honey, jelly, syrup.   Avoid: Any others.  Fats and Oils  Allowed: Margarine, butter, cream, sour cream, oils.   Avoid: Any others.  Beverages  Allowed: All.    Avoid: None.  Condiments  Allowed: Iodized salt, pepper, spices, flavorings. Cocoa powder.   Avoid: Any others.    SAMPLE MEAL PLAN Breakfast   cup orange juice.   1 cup cooked wheat cereal.   1 cup SOY milk.   1 cup beverage (coffee or tea).   Cream or sugar, if desired.    Midmorning Snack  2 SCRAMBLED OR HARD BOILED EGG   Lunch  1 cup cream soup.    cup fruit juice.   1 cup SOY milk.    cup custard.   1 cup beverage (coffee or tea).   Cream or sugar, if desired.    Midafternoon Snack  1 cup VANILLA SOY milk shake.  Dinner  1 cup cream soup.    cup fruit juice.   1 cup SOY MILK    cup pudding.   1 cup beverage (coffee or tea).   Cream or sugar, if desired.  Evening Snack  1 cup supplement.  To increase calories, add sugar, cream, butter, or margarine if possible. Nutritional supplements will also increase the total calories.

## 2012-11-29 NOTE — Telephone Encounter (Signed)
LMOM for a return call.  

## 2012-12-01 ENCOUNTER — Telehealth: Payer: Self-pay

## 2012-12-01 MED ORDER — ONDANSETRON HCL 4 MG PO TABS: 4.0000 mg | ORAL_TABLET | Freq: Three times a day (TID) | ORAL | Status: DC | PRN

## 2012-12-01 NOTE — Telephone Encounter (Signed)
Called and informed pt. She said what is she going to take in place of the Amitiza and also that she cannot find anything on the internet about functional gut disorder. Please advise!

## 2012-12-01 NOTE — Telephone Encounter (Signed)
Pt called and said she is having a lot of bloating. She is taking the Amtiza bid and she said she thought she needed something different, although she said she is having BM's on a regular basis. She had 2 real good ones yesterday and then a little of watery Bm's later. She is most concerned about the bloating. Also, she said she has so much nausea, she gets very nauseated when her feet hit the floor in the morning. She is avoiding lactose foods, but eats more vegetables than anything. Please advise!

## 2012-12-01 NOTE — Telephone Encounter (Signed)
PLEASE CALL PT. AMITIZA CAN CAUSE NAUSEA. IF HER NAUSEA IS WORSE THEN SHE SHOULD HOLD THE AMITIZA. RX FOR ZOFRAN SENT.

## 2012-12-01 NOTE — Telephone Encounter (Signed)
She would like a prescription for the Zofran 4 mg  ( she had that from the ER).

## 2012-12-01 NOTE — Telephone Encounter (Signed)
PLEASE CALL PT.  SHE SHOULD LOOK UP FUNCTIONAL BOWEL DISORDER. SHE MAY USE MOM 2 CAPS TID TO  HAVE A BM.

## 2012-12-01 NOTE — Telephone Encounter (Signed)
Called and informed pt.  

## 2013-01-03 ENCOUNTER — Encounter: Payer: Self-pay | Admitting: Gastroenterology

## 2013-01-04 NOTE — Progress Notes (Signed)
EGD-DIL/TCS MAY 2014 GASTRITIS, NL DUO/COLON Bx MAY 2014: NL HBT  REFERRED TO  [facility]  REVIEWED.

## 2013-01-05 ENCOUNTER — Encounter: Payer: Self-pay | Admitting: Gastroenterology

## 2013-01-05 ENCOUNTER — Ambulatory Visit (INDEPENDENT_AMBULATORY_CARE_PROVIDER_SITE_OTHER): Payer: Medicare Other | Admitting: Gastroenterology

## 2013-01-05 VITALS — BP 118/71 | HR 76 | Temp 97.8°F | Ht 67.0 in | Wt 188.4 lb

## 2013-01-05 DIAGNOSIS — R140 Abdominal distension (gaseous): Secondary | ICD-10-CM

## 2013-01-05 DIAGNOSIS — R141 Gas pain: Secondary | ICD-10-CM

## 2013-01-05 MED ORDER — HYOSCYAMINE SULFATE 0.125 MG SL SUBL: SUBLINGUAL_TABLET | SUBLINGUAL | Status: DC

## 2013-01-05 MED ORDER — DOCUSATE SODIUM 100 MG PO CAPS: ORAL_CAPSULE | ORAL | Status: DC

## 2013-01-05 NOTE — Progress Notes (Signed)
Pt asks if she can take Mylanta or Maalox, she forgot what you told her.

## 2013-01-05 NOTE — Patient Instructions (Addendum)
USE LEVSIN 30 MINS PRIOR TO YOUR BREAKFAST AND LUNCH. IT MAY CAUSE CONSTIPATION, DROWSINESS, DRY EYES/MOUTH, BLURRY VISION, OR DIFFICULTY URINATING.  TAKE DIGESTIVE ADVANTAGE DAILY.  FOLLOW A DAIRY FREE/SOFT MECHANICAL DIET. SEE INFO BELOW.  FOLLOW UP IN OCT 2014.   Lactose Free Diet Lactose is a carbohydrate that is found mainly in milk and milk products, as well as in foods with added milk or whey. Lactose must be digested by the enzyme in order to be used by the body. Lactose intolerance occurs when there is a shortage of lactase. When your body is not able to digest lactose, you may feel sick to your stomach (nausea), bloating, cramping, gas and diarrhea.  There are many dairy products that may be tolerated better than milk by some people:  The use of cultured dairy products such as yogurt, buttermilk, cottage cheese, and sweet acidophilus milk (Kefir) for lactase-deficient individuals is usually well tolerated. This is because the healthy bacteria help digest lactose.   Lactose-hydrolyzed milk (Lactaid) contains 40-90% less lactose than milk and may also be well tolerated.    SPECIAL NOTES  Lactose is a carbohydrates. The major food source is dairy products. Reading food labels is important. Many products contain lactose even when they are not made from milk. Look for the following words: whey, milk solids, dry milk solids, nonfat dry milk powder. Typical sources of lactose other than dairy products include breads, candies, cold cuts, prepared and processed foods, and commercial sauces and gravies.   All foods must be prepared without milk, cream, or other dairy foods.   Soy milk and lactose-free supplements (LACTASE) may be used as an alternative to milk.   FOOD GROUP ALLOWED/RECOMMENDED AVOID/USE SPARINGLY  BREADS / STARCHES 4 servings or more* Breads and rolls made without milk. [place], [place], or [place] bread. Breads and rolls that contain milk. Prepared mixes such as  muffins, biscuits, waffles, pancakes. Sweet rolls, donuts, [place] toast (if made with milk or lactose).  Crackers: Soda crackers, graham crackers. Any crackers prepared without lactose. Zwieback crackers, corn curls, or any that contain lactose.  Cereals: Cooked or dry cereals prepared without lactose (read labels). Cooked or dry cereals prepared with lactose (read labels). Total, Cocoa Krispies. Special K.  Potatoes / Pasta / Rice: Any prepared without milk or lactose. Popcorn. Instant potatoes, frozen [place] fries, scalloped or au gratin potatoes.  VEGETABLES 2 servings or more Fresh, frozen, and canned vegetables. Creamed or breaded vegetables. Vegetables in a cheese sauce or with lactose-containing margarines.  FRUIT 2 servings or more All fresh, canned, or frozen fruits that are not processed with lactose. Any canned or frozen fruits processed with lactose.  MEAT & SUBSTITUTES 2 servings or more (4 to 6 oz. total per day) Plain beef, chicken, fish, [place], lamb, veal, pork, or ham. Kosher prepared meat products. Strained or junior meats that do not contain milk. Eggs, soy meat substitutes, nuts. Scrambled eggs, omelets, and souffles that contain milk. Creamed or breaded meat, fish, or fowl. Sausage products such as wieners, liver sausage, or cold cuts that contain milk solids. Cheese, cottage cheese, or cheese spreads.  MILK None. (See "BEVERAGES" for milk substitutes. See "DESSERTS" for ice cream and frozen desserts.) Milk (whole, 2%, skim, or chocolate). Evaporated, powdered, or condensed milk; malted milk.  SOUPS & COMBINATION FOODS Bouillon, broth, vegetable soups, clear soups, consomms. Homemade soups made with allowed ingredients. Combination or prepared foods that do not contain milk or milk products (read labels). Cream soups, chowders, commercially prepared  soups containing lactose. Macaroni and cheese, pizza. Combination or prepared foods that contain milk or milk products.  DESSERTS  & SWEETS In moderation Water and fruit ices; gelatin; angel food cake. Homemade cookies, pies, or cakes made from allowed ingredients. Pudding (if made with water or a milk substitute). Lactose-free tofu desserts. Sugar, honey, corn syrup, jam, jelly; marmalade; molasses (beet sugar); Pure sugar candy; marshmallows. Ice cream, ice milk, sherbet, custard, pudding, frozen yogurt. Commercial cake and cookie mixes. Desserts that contain chocolate. Pie crust made with milk-containing margarine; reduced-calorie desserts made with a sugar substitute that contains lactose. Toffee, peppermint, butterscotch, chocolate, caramels.  FATS & OILS In moderation Butter (as tolerated; contains very small amounts of lactose). Margarines and dressings that do not contain milk, Vegetable oils, shortening, Miracle Whip, mayonnaise, nondairy cream & whipped toppings without lactose or milk solids added (examples: Coffee Rich, Carnation Coffeemate, Rich's Whipped Topping, PolyRich). Tomasa Blase. Margarines and salad dressings containing milk; cream, cream cheese; peanut butter with added milk solids, sour cream, chip dips, made with sour cream.  BEVERAGES Carbonated drinks; tea; coffee and freeze-dried coffee; some instant coffees (check labels). Fruit drinks; fruit and vegetable juice; Rice or Soy milk. Ovaltine, hot chocolate. Some cocoas; some instant coffees; instant iced teas; powdered fruit drinks (read labels).   CONDIMENTS / MISCELLANEOUS Soy sauce, carob powder, olives, gravy made with water, baker's cocoa, pickles, pure seasonings and spices, wine, pure monosodium glutamate, catsup, mustard. Some chewing gums, chocolate, some cocoas. Certain antibiotics and vitamin / mineral preparations. Spice blends if they contain milk products. MSG extender. Artificial sweeteners that contain lactose such as Equal (Nutra-Sweet) and Sweet 'n Low. Some nondairy creamers (read labels).   SAMPLE MENU*  Breakfast   Orange  Juice.  Banana.   Bran flakes.   Nondairy Creamer.  Vienna Bread (toasted).   Butter or milk-free margarine.   Coffee or tea.    Noon Meal   Chicken Breast.  Rice.   Green beans.   Butter or milk-free margarine.  Fresh melon.   Coffee or tea.    Evening Meal   Roast Beef.  Baked potato.   Butter or milk-free margarine.   Broccoli.   Lettuce salad with vinegar and oil dressing.  [employer].   Coffee or tea.      SOFT MECHANICAL DIET This SOFT MECHANICAL DIET is restricted to:  Foods that are moist, soft-textured, and easy to chew and swallow.   Meats that are ground or are minced no larger than one-quarter inch pieces. Meats are moist with gravy or sauce added.   Foods that do not include bread or bread-like textures except soft pancakes, well-moistened with syrup or sauce.   Textures with some chewing ability required.   Casseroles without rice.   Cooked vegetables that are less than half an inch in size and easily mashed with a fork. No cooked corn, peas, broccoli, cauliflower, cabbage, Brussels sprouts, asparagus, or other fibrous, non-tender or rubbery cooked vegetables.   Canned fruit except for pineapple. Fruit must be cut into pieces no larger than half an inch in size.   Foods that do not include nuts, seeds, coconut, or sticky textures.   FOOD TEXTURES FOR DYSPHAGIA DIET LEVEL 2 -SOFT MECHANICAL DIET (includes all foods on Dysphagia Diet Level 1 - Pureed, in addition to the foods listed below)  FOOD GROUP: Breads. RECOMMENDED: Soft pancakes, well-moistened with syrup or sauce.  AVOID: All others.  FOOD GROUP: Cereals.  RECOMMENDED: Cooked cereals with little texture, including  oatmeal. Unprocessed wheat bran stirred into cereals for bulk. Note: If thin liquids are restricted, it is important that all of the liquid is absorbed into the cereal.  AVOID: All dry cereals and any cooked cereals that may contain flax seeds or other seeds  or nuts. Whole-grain, dry, or coarse cereals. Cereals with nuts, seeds, dried fruit, and/or coconut.  FOOD GROUP: Desserts. RECOMMENDED: Pudding, custard. Soft fruit pies with bottom crust only. Canned fruit (excluding pineapple). Soft, moist cakes with icing.Frozen malts, milk shakes, frozen yogurt, eggnog, nutritional supplements, ice cream, sherbet, regular or sugar-free gelatin, or any foods that become thin liquid at either room (70 F) or body temperature (98 F).  AVOID: Dry, coarse cakes and cookies. Anything with nuts, seeds, coconut, pineapple, or dried fruit. Breakfast yogurt with nuts. Rice or bread pudding.  FOOD GROUP: Fats. RECOMMENDED: Butter, margarine, cream for cereal (depending on liquid consistency recommendations), gravy, cream sauces, sour cream, sour cream dips with soft additives, mayonnaise, salad dressings, cream cheese, cream cheese spreads with soft additives, whipped toppings.  AVOID: All fats with coarse or chunky additives.  FOOD GROUP: Fruits. RECOMMENDED: Soft drained, canned, or cooked fruits without seeds or skin. Fresh soft and ripe banana. Fruit juices with a small amount of pulp. If thin liquids are restricted, fruit juices should be thickened to appropriate consistency.  AVOID: Fresh or frozen fruits. Cooked fruit with skin or seeds. Dried fruits. Fresh, canned, or cooked pineapple.  FOOD GROUP: Meats and Meat Substitutes. (Meat pieces should not exceed 1/4 of an inch cube and should be tender.) RECOMMENDED: Moistened ground or cooked meat, poultry, or fish. Moist ground or tender meat may be served with gravy or sauce. Casseroles without rice. Moist macaroni and cheese, well-cooked pasta with meat sauce, tuna noodle casserole, soft, moist lasagna. Moist meatballs, meatloaf, or fish loaf. Protein salads, such as tuna or egg without large chunks, celery, or onion. Cottage cheese, smooth quiche without large chunks. Poached, scrambled, or soft-cooked eggs  (egg yolks should not be "runny" but should be moist and able to be mashed with butter, margarine, or other moisture added to them). (Cook eggs to 160 F or use pasteurized eggs for safety.) Souffls may have small, soft chunks. Tofu. Well-cooked, slightly mashed, moist legumes, such as baked beans. All meats or protein substitutes should be served with sauces or moistened to help maintain cohesiveness in the oral cavity.  AVOID: Dry meats, tough meats (such as bacon, sausage, hot dogs, bratwurst). Dry casseroles or casseroles with rice or large chunks. Peanut butter. Cheese slices and cubes. Hard-cooked or crisp fried eggs. Sandwiches.Pizza.  FOOD GROUP: Potatoes and Starches. RECOMMENDED: Well-cooked, moistened, boiled, baked, or mashed potatoes. Well-cooked shredded hash brown potatoes that are not crisp. (All potatoes need to be moist and in sauces.)Well-cooked noodles in sauce. Spaetzel or soft dumplings that have been moistened with butter or gravy.  AVOID: Potato skins and chips. Fried or French-fried potatoes. Rice.  FOOD GROUP: Soups. RECOMMENDED: Soups with easy-to-chew or easy-to-swallow meats or vegetables: Particle sizes in soups should be less than 1/2 inch. Soups will need to be thickened to appropriate consistency if soup is thinner than prescribed liquid consistency.  AVOID: Soups with large chunks of meat and vegetables. Soups with rice, corn, peas.  FOOD GROUP: Vegetables. RECOMMENDED: All soft, well-cooked vegetables. Vegetables should be less than a half inch. Should be easily mashed with a fork.  AVOID: Cooked corn and peas. Broccoli, cabbage, Brussels sprouts, asparagus, or other fibrous, non-tender or rubbery cooked  vegetables.  FOOD GROUP: Miscellaneous. RECOMMENDED: Jams and preserves without seeds, jelly. Sauces, salsas, etc., that may have small tender chunks less than 1/2 inch. Soft, smooth chocolate bars that are easily chewed.  AVOID: Seeds, nuts, coconut, or  sticky foods. Chewy candies such as caramels or licorice.

## 2013-01-05 NOTE — Progress Notes (Signed)
Subjective:    Patient ID: Chloe Golden, female    DOB: 08/03/1945, 67 y.o.   MRN: [id number]  DAVIDSON,ERIC, MD  HPI For 3 weeks can't go. When she goes goes a tablespoon. When she's on the commode she thinks she's filling it up,. Night before last-yesterday really filled it up. Went all day long and did a little bit. WAS YELLOW AND TURNED ORANGE. TAPERED OFF. TAKING 1 TBSP AS NEEDED. BLOATING START ABOUT  5 PM. TAKES GAS-C. WHEN SHE EATS TAKES A LACTAID. AFTER SHE EATS SHE TAKEDPHAZYME AND 2 ROLAIDS. STILL SX NOT IDEALLY CONTROLLED.  TAKING [employer] [facility] BUT WONDERING IF SHE CAN TAKE DIGESTIVE ADVANTAGE. WOULD LIKE TO GET WELL BEFORE SHE GETS MARRIED. WAS GONNA GET MARRIED IN SEP 2014 BUT DOESN'T FEEL LIKE GETTING MARRIED DUE TO ABD DISCOMFORT. LIVING OFF VEGETABLES THE WHOLE SUMMER. TAKING COLACE. LOST 10 LBS SINCE APR 2014. GOT FEROCIOUS DIARRHEA AFTER HER LACTULOSE FOR HER HYDROGEN BREATH TEST.  FEELS SHE HAS A UTI WHEN SHE STOPS TAKING ABX. SEES A UROLOGIST.   Past Medical History  Diagnosis Date  . Acid reflux   . Hypertension   . Neuropathy   . Fibromyalgia   . Hiatal hernia   . Arthritis   . Rheumatoid arthritis(714.0)   . Degenerative arthritis of cervical spine 1994  . Degenerative arthritis of cervical spine    Past Surgical History  Procedure Laterality Date  . Cholecystectomy    . Abdominal hysterectomy    . Carpal tunnel release    . Bladder surgery    . Colonoscopy with esophagogastroduodenoscopy (egd) N/A 10/28/2012    [id number]:[id number] diverticulosis in the descending colon and sigmoid colon/Small internal hemorrhoids  . Esophageal biopsy N/A 10/28/2012    Procedure: BIOPSY;  Surgeon: West Bali, MD;  Location: [facility];  Service: Endoscopy;  Laterality: N/A;  DUODENAL BIOPSY  . Esophagogastroduodenoscopy (egd) with esophageal dilation N/A 10/28/2012    [id number] CERVIAL WEB, S/P DILATION/MILD Non-erosive gastritis & DUODENITIS  . Bacterial overgrowth  test N/A 11/09/2012    Procedure: BACTERIAL OVERGROWTH TEST;  Surgeon: West Bali, MD;  Location: [facility];  Service: Endoscopy;  Laterality: N/A;  7:30    Allergies  Allergen Reactions  . Ciprofloxacin Nausea And Vomiting and Other (See Comments)    Unknown reaction: possible yeast infection  . Codeine Other (See Comments)    Constipation  . Dicyclomine     TURNED HER SKIN BLUE  . Ibuprofen Other (See Comments)    Ulcers   . Valium (Diazepam) Other (See Comments)    Alters mental status     Current Outpatient Prescriptions  Medication Sig Dispense Refill  . dexlansoprazole (DEXILANT) 60 MG capsule Take 60 mg by mouth every morning.      . docusate sodium (COLACE) 100 MG capsule Take 100 mg by mouth every morning.      . Eszopiclone (ESZOPICLONE) 3 MG TABS Take 3 mg by mouth at bedtime. Take immediately before bedtime      . fish oil-omega-3 fatty acids 1000 MG capsule Take 1 g by mouth daily.      Marland Kitchen gabapentin (NEURONTIN) 300 MG capsule Take 300-600 mg by mouth 3 (three) times daily. Two capsules taken in the morning, One capsule at lunch, and two capsules with the evening meal      . losartan-hydrochlorothiazide (HYZAAR) 50-12.5 MG per tablet Take 1 tablet by mouth every morning.      . nebivolol (BYSTOLIC) 5 MG tablet  Take 5 mg by mouth at bedtime.      . ondansetron (ZOFRAN) 4 MG tablet Take 1 tablet (4 mg total) by mouth every 8 (eight) hours as needed for nausea.    . Red Yeast Rice 600 MG CAPS Take 1,200 mg by mouth daily.      . sertraline (ZOLOFT) 100 MG tablet Take 100 mg by mouth every morning.      . silodosin (RAPAFLO) 8 MG CAPS capsule Take 8 mg by mouth every morning.      Marland Kitchen tiZANidine (ZANAFLEX) 4 MG tablet Take 4 mg by mouth at bedtime.      . traMADol (ULTRAM) 50 MG tablet Take 50 mg by mouth 2 (two) times daily as needed for pain.          Review of Systems     Objective:   Physical Exam  Vitals reviewed. Constitutional: She is oriented to  person, place, and time. She appears well-nourished. No distress.  HENT:  Head: Normocephalic and atraumatic.  Mouth/Throat: Oropharynx is clear and moist. No oropharyngeal exudate.  Eyes: Pupils are equal, round, and reactive to light. No scleral icterus.  Neck: Normal range of motion. Neck supple.  Cardiovascular: Normal rate, regular rhythm and normal heart sounds.   Pulmonary/Chest: Effort normal and breath sounds normal. No respiratory distress.  Abdominal: Soft. Bowel sounds are normal. She exhibits no distension. There is no tenderness.  OBESE  Musculoskeletal: Normal range of motion. She exhibits no edema.  Lymphadenopathy:    She has no cervical adenopathy.  Neurological: She is alert and oriented to person, place, and time.  NO FOCAL DEFICITS   Psychiatric:  FLAT AFFECT, NL MOOD           Assessment & Plan:

## 2013-01-05 NOTE — Progress Notes (Signed)
CC PCP 

## 2013-01-05 NOTE — Assessment & Plan Note (Signed)
MOST LIKELY DUE TO FUNCTIONAL GUT DISORDER.  ADD LEVSIN 30 MINS PRIOR TO YOUR BREAKFAST AND LUNCH. MED WARNING GIVE. TAKE DIGESTIVE ADVANTAGE DAILY. FOLLOW A DAIRY FREE/SOFT MECHANICAL DIET.  HO GIVEN. FOLLOW UP IN OCT 2014.

## 2013-04-08 ENCOUNTER — Emergency Department (HOSPITAL_COMMUNITY)
Admission: EM | Admit: 2013-04-08 | Discharge: 2013-04-08 | Disposition: A | Discharge status: Home / Self Care | Payer: PRIVATE HEALTH INSURANCE | Attending: Emergency Medicine | Admitting: Emergency Medicine

## 2013-04-08 ENCOUNTER — Encounter (HOSPITAL_COMMUNITY): Payer: Self-pay | Admitting: Emergency Medicine

## 2013-04-08 DIAGNOSIS — IMO0001 Reserved for inherently not codable concepts without codable children: Secondary | ICD-10-CM | POA: Insufficient documentation

## 2013-04-08 DIAGNOSIS — K219 Gastro-esophageal reflux disease without esophagitis: Secondary | ICD-10-CM | POA: Insufficient documentation

## 2013-04-08 DIAGNOSIS — N390 Urinary tract infection, site not specified: Secondary | ICD-10-CM | POA: Insufficient documentation

## 2013-04-08 DIAGNOSIS — Z8739 Personal history of other diseases of the musculoskeletal system and connective tissue: Secondary | ICD-10-CM | POA: Insufficient documentation

## 2013-04-08 DIAGNOSIS — R21 Rash and other nonspecific skin eruption: Secondary | ICD-10-CM | POA: Insufficient documentation

## 2013-04-08 DIAGNOSIS — G589 Mononeuropathy, unspecified: Secondary | ICD-10-CM | POA: Insufficient documentation

## 2013-04-08 DIAGNOSIS — I10 Essential (primary) hypertension: Secondary | ICD-10-CM | POA: Insufficient documentation

## 2013-04-08 DIAGNOSIS — H9209 Otalgia, unspecified ear: Secondary | ICD-10-CM | POA: Insufficient documentation

## 2013-04-08 DIAGNOSIS — Z79899 Other long term (current) drug therapy: Secondary | ICD-10-CM | POA: Insufficient documentation

## 2013-04-08 DIAGNOSIS — K121 Other forms of stomatitis: Secondary | ICD-10-CM

## 2013-04-08 HISTORY — DX: Gastro-esophageal reflux disease without esophagitis: K21.9

## 2013-04-08 HISTORY — DX: Disease of digestive system, unspecified: K92.9

## 2013-04-08 LAB — URINALYSIS, ROUTINE W REFLEX MICROSCOPIC
Bilirubin Urine: NEGATIVE
Glucose, UA: NEGATIVE mg/dL
Ketones, ur: NEGATIVE mg/dL
Nitrite: NEGATIVE
Protein, ur: NEGATIVE mg/dL
Specific Gravity, Urine: 1.01 (ref 1.005–1.030)
Urobilinogen, UA: 0.2 mg/dL (ref 0.0–1.0)
pH: 6.5 (ref 5.0–8.0)

## 2013-04-08 LAB — URINE MICROSCOPIC-ADD ON

## 2013-04-08 MED ORDER — PHENAZOPYRIDINE HCL 100 MG PO TABS
200.0000 mg | ORAL_TABLET | Freq: Once | ORAL | Status: AC
  Administered 2013-04-08: 200 mg via ORAL
  Filled 2013-04-08: qty 2

## 2013-04-08 MED ORDER — CEPHALEXIN 500 MG PO CAPS: 500.0000 mg | ORAL_CAPSULE | Freq: Four times a day (QID) | ORAL | Status: DC

## 2013-04-08 MED ORDER — PHENAZOPYRIDINE HCL 200 MG PO TABS: 200.0000 mg | ORAL_TABLET | Freq: Three times a day (TID) | ORAL | Status: DC

## 2013-04-08 MED ORDER — CEPHALEXIN 500 MG PO CAPS
500.0000 mg | ORAL_CAPSULE | Freq: Once | ORAL | Status: AC
  Administered 2013-04-08: 500 mg via ORAL
  Filled 2013-04-08: qty 1

## 2013-04-08 NOTE — ED Notes (Signed)
Patient with no complaints at this time. Respirations even and unlabored. Skin warm/dry. Discharge instructions reviewed with patient at this time. Patient given opportunity to voice concerns/ask questions. Patient discharged at this time and left Emergency Department with steady gait.   

## 2013-04-08 NOTE — ED Notes (Addendum)
1. Pt thinks she has a uti, having pressure, burning w/ urination. Every month for the last year. 2. Sore throat and bil earaches for 3 days.  3. Rash to back for 3 days. Itching at times.  4. Blisters to mouth for 2 days

## 2013-04-08 NOTE — ED Provider Notes (Signed)
CSN: [id number]     Arrival date & time 04/08/13  1324 History  This chart was scribed for No att. providers found by Ronal Fear, ED Scribe. This patient was seen in room APA04/APA04 and the patient's care was started at 4:02 PM.    Chief Complaint  Patient presents with  . Urinary Tract Infection    1 week  . Sore Throat    3 days  . Otalgia    3 days  . Rash    3 days    Patient is a 67 y.o. female presenting with urinary tract infection, pharyngitis, ear pain, and rash.  Urinary Tract Infection  Sore Throat  Otalgia Associated symptoms: rash   Rash   Pt presents with intermittent recurrent burning painful pressure while urinatinating onset 1x week ago, her symptoms were not improving so she came to the ED.  Pt states that pain is worse in the morning. Pt had a Bladder sling 25yr ago but has since had recurrent bladder infections her PCP placed her antibiotics with short term relief but the infections continue to reoccur.. Pt also presents with sudden onset ulcers to both sides of her tongue onset 3x days ago. She states that she uses hydrogen peroxide to rinse her mouth. She has an associated yellow color to her tongue.  Pt denies fever, chills, nausea, and emesis.   PCP: Dr Carlota Raspberry Past Medical History  Diagnosis Date  . Acid reflux   . Hypertension   . Neuropathy   . Fibromyalgia   . Hiatal hernia   . Arthritis   . Rheumatoid arthritis(714.0)   . Degenerative arthritis of cervical spine 1994  . Degenerative arthritis of cervical spine   . Functional gastrointestinal disorder   . GERD (gastroesophageal reflux disease)    Past Surgical History  Procedure Laterality Date  . Cholecystectomy    . Abdominal hysterectomy    . Carpal tunnel release    . Bladder surgery    . Colonoscopy with esophagogastroduodenoscopy (egd) N/A 10/28/2012    WUJ:[id number] diverticulosis in the descending colon and sigmoid colon/Small internal hemorrhoids  . Esophageal biopsy N/A  10/28/2012    Procedure: BIOPSY;  Surgeon: West Bali, MD;  [facility]: [facility];  Service: Endoscopy;  Laterality: N/A;  DUODENAL BIOPSY  . Esophagogastroduodenoscopy (egd) with esophageal dilation N/A 10/28/2012    [id number] CERVIAL WEB, S/P DILATION/MILD Non-erosive gastritis & DUODENITIS  . Bacterial overgrowth test N/A 11/09/2012    Procedure: BACTERIAL OVERGROWTH TEST;  Surgeon: West Bali, MD;  Location: [facility];  Service: Endoscopy;  Laterality: N/A;  7:30   Family History  Problem Relation Age of Onset  . Lung cancer Father   . Colon cancer Neg Hx   . Crohn's disease Cousin   . Celiac disease Neg Hx    History  Substance Use Topics  . Smoking status: Never Smoker   . Smokeless tobacco: Not on file  . Alcohol Use: No   OB History   Grav Para Term Preterm Abortions TAB SAB Ect Mult Living                 Review of Systems  HENT: Positive for ear pain.   Skin: Positive for rash.  A complete 10 system review of systems was obtained and all systems are negative except as noted in the HPI and PMH.    Allergies  Ciprofloxacin; Codeine; Dicyclomine; Ibuprofen; and Valium  Home Medications   Current Outpatient Rx  Name  Route  Sig  Dispense  Refill  . busPIRone (BUSPAR) 10 MG tablet   Oral   Take 10 mg by mouth 2 (two) times daily.          Marland Kitchen dexlansoprazole (DEXILANT) 60 MG capsule   Oral   Take 60 mg by mouth every morning.         . docusate sodium (COLACE) 100 MG capsule   Oral   Take 200 mg by mouth daily. 1 PO BID         . eszopiclone (LUNESTA) 1 MG TABS tablet   Oral   Take 1 mg by mouth at bedtime. Take immediately before bedtime         . fish oil-omega-3 fatty acids 1000 MG capsule   Oral   Take 1 g by mouth daily.         Marland Kitchen gabapentin (NEURONTIN) 300 MG capsule   Oral   Take 300-600 mg by mouth 3 (three) times daily. Two capsules taken in the morning, One capsule at lunch, and two capsules with the evening meal          . losartan-hydrochlorothiazide (HYZAAR) 50-12.5 MG per tablet   Oral   Take 1 tablet by mouth every morning.         . nebivolol (BYSTOLIC) 5 MG tablet   Oral   Take 5 mg by mouth at bedtime.         . Red Yeast Rice 600 MG CAPS   Oral   Take 1,200 mg by mouth daily.         . sertraline (ZOLOFT) 100 MG tablet   Oral   Take 100 mg by mouth every morning.         . silodosin (RAPAFLO) 8 MG CAPS capsule   Oral   Take 8 mg by mouth every morning.         Marland Kitchen tiZANidine (ZANAFLEX) 4 MG tablet   Oral   Take 4 mg by mouth at bedtime.         . traMADol (ULTRAM) 50 MG tablet   Oral   Take 50 mg by mouth 2 (two) times daily as needed for pain.         . cephALEXin (KEFLEX) 500 MG capsule   Oral   Take 1 capsule (500 mg total) by mouth 4 (four) times daily.   28 capsule   0   . metroNIDAZOLE (FLAGYL) 500 MG tablet               . phenazopyridine (PYRIDIUM) 200 MG tablet   Oral   Take 1 tablet (200 mg total) by mouth 3 (three) times daily.   6 tablet   0   . sucralfate (CARAFATE) 1 G tablet   Oral   Take 1 g by mouth 2 (two) times daily.           BP 139/90  Pulse 67  Temp(Src) 98.1 F (36.7 C) (Oral)  Resp 18  Ht 5\' 4"  (1.626 m)  Wt 182 lb (82.555 kg)  BMI 31.22 kg/m2  SpO2 100% Physical Exam  Nursing note and vitals reviewed. Constitutional: She is oriented to person, place, and time. She appears well-developed and well-nourished. No distress.  HENT:  Head: Normocephalic and atraumatic.  Evidence of stomatitis of her tongue   Eyes: EOM are normal.  Neck: Normal range of motion.  Cardiovascular: Normal rate, regular rhythm and normal heart sounds.   Pulmonary/Chest: Effort normal and  breath sounds normal.  Abdominal: Soft. She exhibits no distension. There is no tenderness.  Musculoskeletal: Normal range of motion.  Neurological: She is alert and oriented to person, place, and time.  Skin: Skin is warm and dry.  Psychiatric: She has a  normal mood and affect. Judgment normal.    ED Course  Procedures (including critical care time)  DIAGNOSTIC STUDIES: Oxygen Saturation is 97% on RA, adequate by my interpretation.    COORDINATION OF CARE:    4:04 PM- Pt advised of plan for treatment including antibiotics for the UTI  and drinking lots of fluids for her tongue and pt agrees.   Labs Review Labs Reviewed  URINALYSIS, ROUTINE W REFLEX MICROSCOPIC - Abnormal; Notable for the following:    Color, Urine STRAW (*)    Hgb urine dipstick TRACE (*)    Leukocytes, UA SMALL (*)    All other components within normal limits  URINE MICROSCOPIC-ADD ON - Abnormal; Notable for the following:    Squamous Epithelial / LPF FEW (*)    All other components within normal limits  URINALYSIS W MICROSCOPIC + REFLEX CULTURE  URINALYSIS, ROUTINE W REFLEX MICROSCOPIC   Imaging Review No results found.  EKG Interpretation   None       MDM   1. Urinary tract infection   2. Stomatitis    Patient with evidence of urinary tract infection.  No signs of pyelonephritis.  She also presents with what appears to be upper respiratory symptoms with possible stomatitis.  Overall well-appearing.  Vital signs normal.  Discharge home with antibiotics.  She understands return to the ER for new or worsening symptoms  I personally performed the services described in this documentation, which was scribed in my presence. The recorded information has been reviewed and is accurate.       Lyanne Co, MD 04/08/13 [phone]

## 2016-08-20 ENCOUNTER — Encounter: Payer: Self-pay | Admitting: Gastroenterology

## 2016-09-09 ENCOUNTER — Ambulatory Visit: Payer: Medicare Other | Admitting: Gastroenterology

## 2016-10-02 ENCOUNTER — Ambulatory Visit: Payer: Medicare Other | Admitting: Gastroenterology

## 2016-10-05 ENCOUNTER — Ambulatory Visit: Payer: Medicare Other | Admitting: Nurse Practitioner

## 2016-10-28 ENCOUNTER — Other Ambulatory Visit: Payer: Self-pay

## 2016-10-28 ENCOUNTER — Ambulatory Visit (INDEPENDENT_AMBULATORY_CARE_PROVIDER_SITE_OTHER): Payer: Medicare HMO | Admitting: Nurse Practitioner

## 2016-10-28 ENCOUNTER — Encounter: Payer: Self-pay | Admitting: Nurse Practitioner

## 2016-10-28 VITALS — BP 127/82 | HR 84 | Temp 97.2°F | Ht 64.0 in | Wt 196.2 lb

## 2016-10-28 DIAGNOSIS — K219 Gastro-esophageal reflux disease without esophagitis: Secondary | ICD-10-CM | POA: Diagnosis not present

## 2016-10-28 DIAGNOSIS — R1319 Other dysphagia: Secondary | ICD-10-CM

## 2016-10-28 DIAGNOSIS — R1310 Dysphagia, unspecified: Secondary | ICD-10-CM

## 2016-10-28 MED ORDER — LIDOCAINE VISCOUS 2 % MT SOLN: 10.0000 mL | Freq: Four times a day (QID) | OROMUCOSAL | 0 refills | Status: DC | PRN

## 2016-10-28 NOTE — Patient Instructions (Addendum)
1. Keep taking Dexilant. 2. Takes Zantac every other day in the evening. 3. I sent in a prescription for viscous lidocaine which is like a thick lidocaine liquid. He can use this as needed for breakthrough symptoms based on the instructions on the bottle. 4. We will schedule your upper endoscopy with possible dilation. 5. Talk with your primary care about the need for ongoing Meloxicam (Mobic) given your history of GERD, dysphagia, and gastric ulcer. 6. Return for follow-up in 3 months. 7. Call us if he have any worsening or severe symptoms.

## 2016-10-28 NOTE — Progress Notes (Signed)
cc'ed to pcp °

## 2016-10-28 NOTE — Progress Notes (Addendum)
REVIEWED-NO ADDITIONAL RECOMMENDATIONS.  Referring Provider: Netta Cedars, MD Primary Care Physician:  Renee Rival, NP Primary GI:  Dr. Oneida Alar  Chief Complaint  Patient presents with  . Gastroesophageal Reflux    last egd approx 6 yrs ago  . Dysphagia    HPI:   Chloe Golden is a 71 y.o. female who presents for GERD. The patient was last seen in our office 01/05/2013 for abdominal bloating. Abdominal bloating deemed most likely due to functional gut disorder. Recommended left send 30 minutes prior to breakfast and lunch, recommend dairy free diet. Recommended follow-up October 2014 which did not occur.  PCP notes reviewed. She last saw primary care on Femara 28th 2018 for worsening GERD and requested a GI referral. She is artery on Dexilant.  Last endoscopy 10/28/2012 for bloating, constipation followed by diarrhea, dysphagia. Findings included normal mutant close of the esophagus, mild nonerosive gastritis with hemorrhage in the gastric antrum status post biopsies, mild duodenitis in the duodenal bulb status post biopsies. Esophageal dilation performed. Impression includes most likely proximal esophageal web status post dilation. Recommended continue Dexilant, test for SIBO if biopsies are negative. Surgical pathology found chronic inactive gastritis negative for H. pylori. Duodenal biopsies found benign small bowel mucosa.  Hydrogen breath test found normal hydrogen breath test, no indication of bacterial overgrowth. Recommended daily probiotic, referred to [facility] for functional gut disorder evaluation.  She was seen by [facility] functional gut disorders. Diagnosis of irritable bowel syndrome and functional dyspepsia. Recommended adding BuSpar to her medication regimen. Recommended anorectal manometry. Recommended GI clinic evaluation. This visit does not appear to have occurred.  Today she states her symptoms were doing pretty well until abotu a year ago. Has been on nexium  before then. Her PCP placed her on Dexilant which had helped. Has worsening at night when she lays down and includes esophageal burning. Daily epigastric pain in the morning. Also with bloating. Does dietary avoidance such as fried foods, fatty foods. Also taking Zantac up to twice daily and Rolaids. Minimal daytime symptoms unless she bends forward. Also on Maalox as needed. Sleeps on 2 pillows. Last meal of the day between 3-5 pm and goes to bed about 10 pm. Dysphagia for the past 1-2 years, solid food dysphagia. No pill dysphagia. Worse with bread and meats. Persistent symptoms despite small bites and chewing very well. Denies hematochezia, melena, N/V, acute changes in bowel habits, unintentional weight loss. Denies chest pain, dyspnea, dizziness, lightheadedness, syncope, near syncope. Denies any other upper or lower GI symptoms.  Denies NSAIDs and ASA powders. She is on Mobic.  Past Medical History:  Diagnosis Date  . Acid reflux   . Arthritis   . Degenerative arthritis of cervical spine 1994  . Degenerative arthritis of cervical spine   . Fibromyalgia   . Functional gastrointestinal disorder   . GERD (gastroesophageal reflux disease)   . Hiatal hernia   . Hypertension   . Neuropathy   . Rheumatoid arthritis(714.0)     Past Surgical History:  Procedure Laterality Date  . ABDOMINAL HYSTERECTOMY    . BACTERIAL OVERGROWTH TEST N/A 11/09/2012   Procedure: BACTERIAL OVERGROWTH TEST;  Surgeon: Danie Binder, MD;  Location: [facility];  Service: Endoscopy;  Laterality: N/A;  7:30  . BIOPSY N/A 10/28/2012   Procedure: BIOPSY;  Surgeon: Danie Binder, MD;  Location: [facility];  Service: Endoscopy;  Laterality: N/A;  DUODENAL BIOPSY  . BLADDER SURGERY    . CARPAL TUNNEL RELEASE    .  CHOLECYSTECTOMY    . COLONOSCOPY WITH ESOPHAGOGASTRODUODENOSCOPY (EGD) N/A 10/28/2012   [id number]:[id number] diverticulosis in the descending colon and sigmoid colon/Small internal hemorrhoids  .  ESOPHAGOGASTRODUODENOSCOPY (EGD) WITH ESOPHAGEAL DILATION N/A 10/28/2012   [id number] CERVIAL WEB, S/P DILATION/MILD Non-erosive gastritis & DUODENITIS    Current Outpatient Prescriptions  Medication Sig Dispense Refill  . busPIRone (BUSPAR) 10 MG tablet Take 10 mg by mouth 2 (two) times daily.     Marland Kitchen Co-Enzyme Q-10 30 MG CAPS Take 30 mg by mouth daily.    Marland Kitchen dexlansoprazole (DEXILANT) 60 MG capsule Take 60 mg by mouth every morning.    . docusate sodium (COLACE) 100 MG capsule Take 200 mg by mouth daily as needed. 1 PO BID     . ezetimibe (ZETIA) 10 MG tablet Take 10 mg by mouth daily.    . fish oil-omega-3 fatty acids 1000 MG capsule Take 1 g by mouth daily.    Marland Kitchen linaclotide (LINZESS) 72 MCG capsule Take 72 mcg by mouth as needed.    Marland Kitchen losartan-hydrochlorothiazide (HYZAAR) 50-12.5 MG per tablet Take 1 tablet by mouth every morning.    Marland Kitchen LYRICA 100 MG capsule Take 100 mg by mouth 2 (two) times daily.    . meloxicam (MOBIC) 15 MG tablet Take 15 mg by mouth daily.    . metFORMIN (GLUCOPHAGE) 500 MG tablet Take 500 mg by mouth daily.    . methocarbamol (ROBAXIN) 500 MG tablet Take 500 mg by mouth 2 (two) times daily.    . nebivolol (BYSTOLIC) 5 MG tablet Take 5 mg by mouth at bedtime.    Marland Kitchen oxybutynin (DITROPAN-XL) 10 MG 24 hr tablet Take 10 mg by mouth daily.    . ranitidine (ZANTAC) 150 MG tablet Take 150 mg by mouth as needed for heartburn.    . venlafaxine (EFFEXOR) 37.5 MG tablet Take 37.5 mg by mouth 2 (two) times daily.     No current facility-administered medications for this visit.     Allergies as of 10/28/2016 - Review Complete 10/28/2016  Allergen Reaction Noted  . Ciprofloxacin Nausea And Vomiting and Other (See Comments) 09/26/2012  . Codeine Other (See Comments) 09/26/2012  . Dicyclomine  01/05/2013  . Ibuprofen Other (See Comments) 09/26/2012  . Valium [diazepam] Other (See Comments) 09/26/2012    Family History  Problem Relation Age of Onset  . Lung cancer Father   .  Crohn's disease Cousin   . Colon cancer Neg Hx   . Celiac disease Neg Hx   . Gastric cancer Neg Hx   . Esophageal cancer Neg Hx     Social History   Social History  . Marital status: Single    Spouse name: N/A  . Number of children: N/A  . Years of education: N/A   Social History Main Topics  . Smoking status: Never Smoker  . Smokeless tobacco: Never Used  . Alcohol use No  . Drug use: No  . Sexual activity: No   Other Topics Concern  . None   Social History Narrative   1 living   2 deceased    Review of Systems: Complete ROS negative except as per HPI.   Physical Exam: BP 127/82   Pulse 84   Temp 97.2 F (36.2 C) (Oral)   Ht 5\' 4"  (1.626 m)   Wt 196 lb 3.2 oz (89 kg)   BMI 33.68 kg/m  General:   Alert and oriented. Pleasant and cooperative. Well-nourished and well-developed.  Head:  Normocephalic and atraumatic. Eyes:  Without icterus, sclera clear and conjunctiva pink.  Ears:  Normal auditory acuity. Cardiovascular:  S1, S2 present without murmurs appreciated. Extremities without clubbing or edema. Respiratory:  Clear to auscultation bilaterally. No wheezes, rales, or rhonchi. No distress.  Gastrointestinal:  +BS, soft,and non-distended. Minimal upper abdominal TTP. No HSM noted. No guarding or rebound. No masses appreciated.  Rectal:  Deferred  Musculoskalatal:  Symmetrical without gross deformities. Neurologic:  Alert and oriented x4;  grossly normal neurologically. Psych:  Alert and cooperative. Normal mood and affect. Heme/Lymph/Immune: No excessive bruising noted.    10/28/2016 3:01 PM   Disclaimer: This note was dictated with voice recognition software. Similar sounding words can inadvertently be transcribed and may not be corrected upon review.

## 2016-10-28 NOTE — Assessment & Plan Note (Addendum)
GERD symptoms were doing well on Dexilant. About one year ago she began having worsening, breakthrough symptoms. She is now taking multiple over-the-counter medications including Zantac one to 2 times a day, Rolaids, milk of magnesia. Also still on Dexilant. Noted dysphasia as per below. At this point I'll have her continue Dexilant, decrease Zantac to every other evening. I will send in viscous lidocaine for breakthrough symptoms. Endoscopic evaluation as per below. Return for follow-up in tree months.  Of note, she is not on any over-the-counter NSAIDs or aspirin powders. However, she is on meloxicam. She will discuss this with her primary care and try to come off of it.

## 2016-10-28 NOTE — Patient Instructions (Signed)
Called [employer] for PA for EGD/+/-Dilation, no PA needed. Ref# [id number]

## 2016-10-28 NOTE — Assessment & Plan Note (Addendum)
History of GERD and dysphagia. Last endoscopy about 4 years ago with dilation. She began having worsening dysphagia about 1 year ago again. Worsening GERD as per above. At this point given her dysphagia symptoms and GERD history we will proceed with endoscopic evaluation with possible dilation. Return for follow-up in 3 months.  Proceed with EGD with Dr. Oneida Alar in near future: the risks, benefits, and alternatives have been discussed with the patient in detail. The patient states understanding and desires to proceed.  The patient is currently on BuSpar, Lyrica. No other anticoagulants, anxiolytics, chronic pain medications, or antidepressants. Conscious sedation should be adequate for her procedure as it was for her last.

## 2016-11-27 ENCOUNTER — Encounter (HOSPITAL_COMMUNITY): Payer: Self-pay | Admitting: *Deleted

## 2016-11-27 ENCOUNTER — Encounter (HOSPITAL_COMMUNITY)
Admission: RE | Disposition: A | Discharge status: Home / Self Care | Payer: Self-pay | Source: Ambulatory Visit | Attending: Gastroenterology

## 2016-11-27 ENCOUNTER — Ambulatory Visit (HOSPITAL_COMMUNITY)
Admission: RE | Admit: 2016-11-27 | Discharge: 2016-11-27 | Disposition: A | Discharge status: Home / Self Care | Payer: Medicare HMO | Source: Ambulatory Visit | Attending: Gastroenterology | Admitting: Gastroenterology

## 2016-11-27 DIAGNOSIS — M797 Fibromyalgia: Secondary | ICD-10-CM | POA: Insufficient documentation

## 2016-11-27 DIAGNOSIS — Z791 Long term (current) use of non-steroidal anti-inflammatories (NSAID): Secondary | ICD-10-CM | POA: Insufficient documentation

## 2016-11-27 DIAGNOSIS — K219 Gastro-esophageal reflux disease without esophagitis: Secondary | ICD-10-CM | POA: Insufficient documentation

## 2016-11-27 DIAGNOSIS — R1310 Dysphagia, unspecified: Secondary | ICD-10-CM | POA: Diagnosis present

## 2016-11-27 DIAGNOSIS — T39395A Adverse effect of other nonsteroidal anti-inflammatory drugs [NSAID], initial encounter: Secondary | ICD-10-CM | POA: Insufficient documentation

## 2016-11-27 DIAGNOSIS — G629 Polyneuropathy, unspecified: Secondary | ICD-10-CM | POA: Insufficient documentation

## 2016-11-27 DIAGNOSIS — I10 Essential (primary) hypertension: Secondary | ICD-10-CM | POA: Diagnosis not present

## 2016-11-27 DIAGNOSIS — K2970 Gastritis, unspecified, without bleeding: Secondary | ICD-10-CM | POA: Insufficient documentation

## 2016-11-27 DIAGNOSIS — Z79899 Other long term (current) drug therapy: Secondary | ICD-10-CM | POA: Insufficient documentation

## 2016-11-27 DIAGNOSIS — K222 Esophageal obstruction: Secondary | ICD-10-CM | POA: Diagnosis not present

## 2016-11-27 HISTORY — PX: SAVORY DILATION: SHX5439

## 2016-11-27 HISTORY — PX: ESOPHAGOGASTRODUODENOSCOPY: SHX5428

## 2016-11-27 LAB — GLUCOSE, CAPILLARY: Glucose-Capillary: 109 mg/dL — ABNORMAL HIGH (ref 65–99)

## 2016-11-27 SURGERY — EGD (ESOPHAGOGASTRODUODENOSCOPY)
Anesthesia: Moderate Sedation

## 2016-11-27 MED ORDER — ONDANSETRON HCL 4 MG/2ML IJ SOLN
INTRAMUSCULAR | Status: DC | PRN
  Administered 2016-11-27: 4 mg via INTRAVENOUS

## 2016-11-27 MED ORDER — SIMETHICONE 40 MG/0.6ML PO SUSP
ORAL | Status: AC
  Filled 2016-11-27: qty 30

## 2016-11-27 MED ORDER — MINERAL OIL PO OIL
TOPICAL_OIL | ORAL | Status: AC
  Filled 2016-11-27: qty 30

## 2016-11-27 MED ORDER — LIDOCAINE VISCOUS 2 % MT SOLN
OROMUCOSAL | Status: AC
  Filled 2016-11-27: qty 15

## 2016-11-27 MED ORDER — ONDANSETRON HCL 4 MG/2ML IJ SOLN
INTRAMUSCULAR | Status: AC
  Filled 2016-11-27: qty 2

## 2016-11-27 MED ORDER — SODIUM CHLORIDE 0.9 % IV SOLN
INTRAVENOUS | Status: DC
  Administered 2016-11-27: 08:00:00 via INTRAVENOUS

## 2016-11-27 MED ORDER — MIDAZOLAM HCL 5 MG/5ML IJ SOLN
INTRAMUSCULAR | Status: AC
  Filled 2016-11-27: qty 10

## 2016-11-27 MED ORDER — MEPERIDINE HCL 100 MG/ML IJ SOLN
INTRAMUSCULAR | Status: DC | PRN
  Administered 2016-11-27: 25 mg via INTRAVENOUS
  Administered 2016-11-27: 50 mg via INTRAVENOUS

## 2016-11-27 MED ORDER — LIDOCAINE VISCOUS 2 % MT SOLN
OROMUCOSAL | Status: DC | PRN
  Administered 2016-11-27: 4 mL via OROMUCOSAL

## 2016-11-27 MED ORDER — STERILE WATER FOR IRRIGATION IR SOLN
Status: DC | PRN
  Administered 2016-11-27: 10:00:00

## 2016-11-27 MED ORDER — MEPERIDINE HCL 100 MG/ML IJ SOLN
INTRAMUSCULAR | Status: AC
  Filled 2016-11-27: qty 2

## 2016-11-27 MED ORDER — MIDAZOLAM HCL 5 MG/5ML IJ SOLN
INTRAMUSCULAR | Status: DC | PRN
  Administered 2016-11-27: 1 mg via INTRAVENOUS
  Administered 2016-11-27: 2 mg via INTRAVENOUS

## 2016-11-27 NOTE — Discharge Instructions (Signed)
I dilated your esophagus. You have a stricture near the base of your esophagus.  You have mild gastritis DUE TO MOBIC.    DRINK WATER TO KEEP YOUR URINE LIGHT YELLOW.  CONTINUE YOUR WEIGHT LOSS EFFORTS. LOSE 20 LBS. YOU SHOULD TRANSITION TO A PLANT BASED DIET-NO MEAT OR DAIRY FOR 6 MOS. IT WILL HELP YOU LOSE WEIGHT. I RECOMMEND THE BOOK, "PREVENT AND REVERSE HEART DISEASE". CALDWELL ESSELSTYN JR., MD. PAGE 120-121 CLEARLY STATE THE RULES AND QUICK AND EASY RECIPES FOR BREAKFAST, LUNCH, AND DINNER ARE AFTER P 127.  AVOID REFLUX TRIGGERS. SEE INFO BELOW.   CONTINUE DEXILANT  PEPCID OR ZANTAC HELP MOST WHEN USED IF NEEDED TO CONTROL HEARTBURN AND REFLUX.  FOLLOW UP IN 4 MOS.  UPPER ENDOSCOPY AFTER CARE Read the instructions outlined below and refer to this sheet in the next week. These discharge instructions provide you with general information on caring for yourself after you leave the hospital. While your treatment has been planned according to the most current medical practices available, unavoidable complications occasionally occur. If you have any problems or questions after discharge, call DR. Manvir Prabhu, [phone].  ACTIVITY  You may resume your regular activity, but move at a slower pace for the next 24 hours.   Take frequent rest periods for the next 24 hours.   Walking will help get rid of the air and reduce the bloated feeling in your belly (abdomen).   No driving for 24 hours (because of the medicine (anesthesia) used during the test).   You may shower.   Do not sign any important legal documents or operate any machinery for 24 hours (because of the anesthesia used during the test).    NUTRITION  Drink plenty of fluids.   You may resume your normal diet as instructed by your doctor.   Begin with a light meal and progress to your normal diet. Heavy or fried foods are harder to digest and may make you feel sick to your stomach (nauseated).   Avoid alcoholic beverages  for 24 hours or as instructed.    MEDICATIONS  You may resume your normal medications.   WHAT YOU CAN EXPECT TODAY  Some feelings of bloating in the abdomen.   Passage of more gas than usual.    IF YOU HAD A BIOPSY TAKEN DURING THE UPPER ENDOSCOPY:  Eat a soft diet IF YOU HAVE NAUSEA, BLOATING, ABDOMINAL PAIN, OR VOMITING.    FINDING OUT THE RESULTS OF YOUR TEST Not all test results are available during your visit. DR. Oneida Alar WILL CALL YOU WITHIN 7 DAYS OF YOUR PROCEDUE WITH YOUR RESULTS. Do not assume everything is normal if you have not heard from DR. Ranette Luckadoo IN ONE WEEK, CALL HER OFFICE AT [phone].  SEEK IMMEDIATE MEDICAL ATTENTION AND CALL THE OFFICE: [phone] IF:  You have more than a spotting of blood in your stool.   Your belly is swollen (abdominal distention).   You are nauseated or vomiting.   You have a temperature over 101F.   You have abdominal pain or discomfort that is severe or gets worse throughout the day.   Lifestyle and home remedies TO CONTROL REFLUX/HEARTBURN You may eliminate or reduce the frequency of heartburn by making the following lifestyle changes:   Control your weight. Being overweight is a major risk factor for heartburn and GERD. Excess pounds put pressure on your abdomen, pushing up your stomach and causing acid to back up into your esophagus.    Eat smaller meals. 4  TO 6 MEALS A DAY. This reduces pressure on the lower esophageal sphincter, helping to prevent the valve from opening and acid from washing back into your esophagus.    Loosen your belt. Clothes that fit tightly around your waist put pressure on your abdomen and the lower esophageal sphincter.    Eliminate heartburn triggers. Everyone has specific triggers. Common triggers such as fatty or fried foods, spicy food, tomato sauce, carbonated beverages, alcohol, chocolate, mint, garlic, onion, caffeine and nicotine may make heartburn worse.    Avoid stooping or  bending. Tying your shoes is OK. Bending over for longer periods to weed your garden isn't, especially soon after eating.    Don't lie down after a meal. Wait at least three to four hours after eating before going to bed, and don't lie down right after eating.   Alternative medicine  Several home remedies exist for treating GERD, but they provide only temporary relief. They include drinking baking soda (sodium bicarbonate) added to water or drinking other fluids such as baking soda mixed with cream of tartar and water.  Although these liquids create temporary relief by neutralizing, washing away or buffering acids, eventually they aggravate the situation by adding gas and fluid to your stomach, increasing pressure and causing more acid reflux. Further, adding more sodium to your diet may increase your blood pressure and add stress to your heart, and excessive bicarbonate ingestion can alter the acid-base balance in your body.  ESOPHAGEAL STRICTURE  Esophageal strictures can be caused by stomach acid backing up into the tube that carries food from the mouth down to the stomach (lower esophagus).  TREATMENT There are a number of medicines used to treat reflux/stricture, including: Antacids.  Proton-pump inhibitors: DEXILANT ZANTAC OR PEPCID  HOME CARE INSTRUCTIONS Eat 2-3 hours before going to bed.  Try to reach and maintain a healthy weight.  Do not eat just a few very large meals. Instead, eat 4 TO 6 smaller meals throughout the day.  Try to identify foods and beverages that make your symptoms worse, and avoid these.  Avoid tight clothing.  Do not exercise right after eating.

## 2016-11-27 NOTE — H&P (Signed)
Primary Care Physician:  Renee Rival, NP Primary Gastroenterologist:  Dr. Oneida Alar  Pre-Procedure History & Physical: HPI:  Chloe Golden is a 70 y.o. female here for [place].  Past Medical History:  Diagnosis Date  . Acid reflux   . Arthritis   . Degenerative arthritis of cervical spine 1994  . Degenerative arthritis of cervical spine   . Fibromyalgia   . Functional gastrointestinal disorder   . GERD (gastroesophageal reflux disease)   . Hiatal hernia   . Hypertension   . Neuropathy   . Rheumatoid arthritis(714.0)     Past Surgical History:  Procedure Laterality Date  . ABDOMINAL HYSTERECTOMY    . BACTERIAL OVERGROWTH TEST N/A 11/09/2012   Procedure: BACTERIAL OVERGROWTH TEST;  Surgeon: Danie Binder, MD;  Location: [facility];  Service: Endoscopy;  Laterality: N/A;  7:30  . BIOPSY N/A 10/28/2012   Procedure: BIOPSY;  Surgeon: Danie Binder, MD;  Location: [facility];  Service: Endoscopy;  Laterality: N/A;  DUODENAL BIOPSY  . BLADDER SURGERY    . CARPAL TUNNEL RELEASE    . CHOLECYSTECTOMY    . COLONOSCOPY WITH ESOPHAGOGASTRODUODENOSCOPY (EGD) N/A 10/28/2012   KVQ:[id number] diverticulosis in the descending colon and sigmoid colon/Small internal hemorrhoids  . ESOPHAGOGASTRODUODENOSCOPY (EGD) WITH ESOPHAGEAL DILATION N/A 10/28/2012   [id number] CERVIAL WEB, S/P DILATION/MILD Non-erosive gastritis & DUODENITIS    Prior to Admission medications   Medication Sig Start Date End Date Taking? Authorizing Provider  busPIRone (BUSPAR) 10 MG tablet Take 10 mg by mouth 2 (two) times daily.  04/04/13  Yes [provider]  Coenzyme Q10 (COQ-10) 100 MG CAPS Take 100 mg by mouth daily.   Yes [provider]  dexlansoprazole (DEXILANT) 60 MG capsule Take 60 mg by mouth daily.    Yes [provider]  ezetimibe (ZETIA) 10 MG tablet Take 10 mg by mouth at bedtime.  10/20/16  Yes [provider]  lidocaine (XYLOCAINE) 2 % solution Use as directed 10  mLs in the mouth or throat every 6 (six) hours as needed (breakthrough reflux symptoms). 10/28/16  Yes Carlis Stable, NP  losartan-hydrochlorothiazide (HYZAAR) 50-12.5 MG per tablet Take 1 tablet by mouth daily.    Yes [provider]  LYRICA 100 MG capsule Take 100 mg by mouth 2 (two) times daily. 10/20/16  Yes [provider]  meloxicam (MOBIC) 15 MG tablet Take 15 mg by mouth daily. 10/20/16  Yes [provider]  metFORMIN (GLUCOPHAGE) 500 MG tablet Take 500 mg by mouth daily. 10/20/16  Yes [provider]  methocarbamol (ROBAXIN) 500 MG tablet Take 500 mg by mouth 2 (two) times daily. 09/23/16  Yes [provider]  nebivolol (BYSTOLIC) 5 MG tablet Take 5 mg by mouth at bedtime.   Yes [provider]  Omega-3 Fatty Acids (OMEGA 3 500) 500 MG CAPS Take 500 mg by mouth daily.   Yes [provider]  oxybutynin (DITROPAN-XL) 10 MG 24 hr tablet Take 10 mg by mouth daily. 10/20/16  Yes [provider]  venlafaxine (EFFEXOR) 37.5 MG tablet Take 37.5 mg by mouth 2 (two) times daily. 10/20/16  Yes [provider]  Vitamin D, Ergocalciferol, (DRISDOL) 50000 units CAPS capsule Take 50,000 Units by mouth 2 (two) times a week. On Mon and Fri   Yes [provider]  cetirizine (ZYRTEC) 10 MG tablet Take 10 mg by mouth daily as needed for allergies.    [provider]  docusate sodium (COLACE) 100  MG capsule Take 200 mg by mouth 2 (two) times daily.  01/05/13   Jadwiga Faidley, Marga Melnick, MD  linaclotide (LINZESS) 72 MCG capsule Take 72 mcg by mouth as needed (Constipation).     [provider]    Allergies as of 10/28/2016 - Review Complete 10/28/2016  Allergen Reaction Noted  . Ciprofloxacin Nausea And Vomiting and Other (See Comments) 09/26/2012  . Codeine Other (See Comments) 09/26/2012  . Dicyclomine  01/05/2013  . Ibuprofen Other (See Comments) 09/26/2012  . Valium [diazepam] Other (See Comments) 09/26/2012    Family  History  Problem Relation Age of Onset  . Lung cancer Father   . Crohn's disease Cousin   . Colon cancer Neg Hx   . Celiac disease Neg Hx   . Gastric cancer Neg Hx   . Esophageal cancer Neg Hx     Social History   Social History  . Marital status: Single    Spouse name: N/A  . Number of children: N/A  . Years of education: N/A   Occupational History  . Not on file.   Social History Main Topics  . Smoking status: Never Smoker  . Smokeless tobacco: Never Used  . Alcohol use No  . Drug use: No  . Sexual activity: No   Other Topics Concern  . Not on file   Social History Narrative   1 living   2 deceased    Review of Systems: See HPI, otherwise negative ROS   Physical Exam: BP (!) 131/56   Pulse 69   Temp 97.9 F (36.6 C) (Oral)   Resp 18   Ht 5\' 4"  (1.626 m)   Wt 196 lb (88.9 kg)   SpO2 93%   BMI 33.64 kg/m  General:   Alert,  pleasant and cooperative in NAD Head:  Normocephalic and atraumatic. Neck:  Supple; Lungs:  Clear throughout to auscultation.    Heart:  Regular rate and rhythm. Abdomen:  Soft, nontender and nondistended. Normal bowel sounds, without guarding, and without rebound.   Neurologic:  Alert and  oriented x4;  grossly normal neurologically.  Impression/Plan:     DYSPHAGIA  PLAN:  EGD/DIL TODAY. DISCUSSED PROCEDURE, BENEFITS, & RISKS: < 1% chance of medication reaction, bleeding, or perforation.

## 2016-11-27 NOTE — Op Note (Signed)
[facility] Patient Name: Chloe Golden Procedure Date: 11/27/2016 9:15 AM MRN: [id number] Date of Birth: 1945/07/08 Attending MD: Barney Drain , MD CSN: [id number] Age: 71 Admit Type: Outpatient Procedure:                Upper GI endoscopy WITH ESOPHAGEAL DILATION Indications:              Dysphagia, Gastro-esophageal reflux disease-GAINED                            >10 LBS IN 4 YEARS. Providers:                Barney Drain, MD, Jeanann Lewandowsky. Sharon Seller, RN, Randa Spike, Technician Referring MD:             Renee Rival Medicines:                Ondansetron 4 mg IV, Meperidine 75 mg IV, Midazolam                            3 mg IV Complications:            No immediate complications. Estimated Blood Loss:     Estimated blood loss: none. Procedure:                Pre-Anesthesia Assessment:                           - Prior to the procedure, a History and Physical                            was performed, and patient medications and                            allergies were reviewed. The patient's tolerance of                            previous anesthesia was also reviewed. The risks                            and benefits of the procedure and the sedation                            options and risks were discussed with the patient.                            All questions were answered, and informed consent                            was obtained. Prior Anticoagulants: The patient has                            taken previous NSAID medication, last dose was 1  day prior to procedure. ASA Grade Assessment: II -                            A patient with mild systemic disease. After                            reviewing the risks and benefits, the patient was                            deemed in satisfactory condition to undergo the                            procedure. After obtaining informed consent, the        endoscope was passed under direct vision.                            Throughout the procedure, the patient's blood                            pressure, pulse, and oxygen saturations were                            monitored continuously. The [id number]) was                            introduced through the mouth, and advanced to the                            second part of duodenum. The upper GI endoscopy was                            accomplished without difficulty. The patient                            tolerated the procedure well. Scope In: 9:41:45 AM Scope Out: 9:49:11 AM Total Procedure Duration: 0 hours 7 minutes 26 seconds  Findings:      One moderate (circumferential scarring or stenosis; an endoscope may       pass) benign-appearing, intrinsic stenosis was found. This measured 1.3       cm (inner diameter) and was traversed. A guidewire was placed and the       scope was withdrawn. Dilation was performed with a Savary dilator with       mild resistance at 12.8 mm, 14 mm, 15 mm, 16 mm and 17 mm. Estimated       blood loss: none.      Segmental mild inflammation characterized by congestion (edema) and       erythema was found in the gastric antrum.      The examined duodenum was normal. Impression:               - Benign-appearing esophageal stenosis. Dilated.                           - Gastritis DUE TO MOBIC                           -  Moderate Sedation:      Moderate (conscious) sedation was administered by the endoscopy nurse       and supervised by the endoscopist. The following parameters were       monitored: oxygen saturation, heart rate, blood pressure, and response       to care. Total physician intraservice time was 15 minutes. Recommendation:           - Low fat diet. AVOID REFLUX TRIGGERS. CONSIDER                            PLANT BASED DIET.                           - Continue present medications. LOSE 20 LBS.                           - Return to  my office in 4 months.                           - Patient has a contact number available for                            emergencies. The signs and symptoms of potential                            delayed complications were discussed with the                            patient. Return to normal activities tomorrow.                            Written discharge instructions were provided to the                            patient. Procedure Code(s):        --- Professional ---                           [phone], Esophagogastroduodenoscopy, flexible,                            transoral; with insertion of guide wire followed by                            passage of dilator(s) through esophagus over guide                            wire                           99152, Moderate sedation services provided by the                            same physician or other qualified health care  professional performing the diagnostic or                            therapeutic service that the sedation supports,                            requiring the presence of an independent trained                            observer to assist in the monitoring of the                            patient's level of consciousness and physiological                            status; initial 15 minutes of intraservice time,                            patient age 73 years or older Diagnosis Code(s):        --- Professional ---                           K22.2, Esophageal obstruction                           K29.70, Gastritis, unspecified, without bleeding                           R13.10, Dysphagia, unspecified                           K21.9, Gastro-esophageal reflux disease without                            esophagitis CPT copyright 2016 American Medical Association. All rights reserved. The codes documented in this report are preliminary and upon coder review may  be revised to meet current  compliance requirements. Barney Drain, MD Barney Drain, MD 11/27/2016 10:00:40 AM This report has been signed electronically. Number of Addenda: 0

## 2016-12-04 ENCOUNTER — Encounter (HOSPITAL_COMMUNITY): Payer: Self-pay | Admitting: Gastroenterology

## 2017-01-28 ENCOUNTER — Ambulatory Visit: Payer: Medicare HMO | Admitting: Nurse Practitioner

## 2017-03-22 ENCOUNTER — Ambulatory Visit: Payer: Medicare HMO | Admitting: Nurse Practitioner

## 2017-05-05 ENCOUNTER — Encounter: Payer: Self-pay | Admitting: Nurse Practitioner

## 2017-05-05 ENCOUNTER — Encounter: Payer: Self-pay | Admitting: *Deleted

## 2017-05-05 ENCOUNTER — Ambulatory Visit (INDEPENDENT_AMBULATORY_CARE_PROVIDER_SITE_OTHER): Payer: Medicare Other | Admitting: Nurse Practitioner

## 2017-05-05 VITALS — BP 153/86 | HR 83 | Temp 97.4°F | Ht 64.0 in | Wt 199.0 lb

## 2017-05-05 DIAGNOSIS — R1310 Dysphagia, unspecified: Secondary | ICD-10-CM

## 2017-05-05 DIAGNOSIS — K219 Gastro-esophageal reflux disease without esophagitis: Secondary | ICD-10-CM | POA: Diagnosis not present

## 2017-05-05 DIAGNOSIS — R1319 Other dysphagia: Secondary | ICD-10-CM

## 2017-05-05 MED ORDER — RANITIDINE HCL 150 MG PO TABS: 150.0000 mg | ORAL_TABLET | ORAL | 2 refills | Status: DC

## 2017-05-05 NOTE — Assessment & Plan Note (Signed)
Worsening GERD symptoms.  Recommend stop Mobic.  I explained what Mobic is when she realizes it is a long-acting NSAID she understood.  States she will stop taking it now.  Continue other insulin avoidance.  We will retry Dexilant every other day in the evenings.  Call in 2 to 4 weeks with a progress report.  If this is ineffective we can try potentially Carafate.  Previously tried lidocaine solution she stated "that was nasty and I could not drink it."  Continue Dexilant otherwise.  Return for follow-up in 3 months.

## 2017-05-05 NOTE — Assessment & Plan Note (Signed)
Persistent dysphasia symptoms despite recent dilation.  Suspect possible underlying functional issue rather than simple stricture.  At this point I will recheck a barium pill esophagram as that has not been completed since 2010.  Return for follow-up in 3 months.

## 2017-05-05 NOTE — Patient Instructions (Signed)
1. Continue taking Dexilant. 2. Stop taking Mobic. 3. Avoid all NSAIDs (ibuprofen, Motrin, Advil, Aleve, naproxen, Naprosyn, any medication with "inside" on the bottle.) 4. Take Zantac over-the-counter in the evenings, every other day. 5. Call us in 2-4 weeks and let us know how you are doing. 6. We can try Carafate again if the Zantac does not help. 7. We will schedule your swallowing evaluation for you to help further evaluate your swallowing difficulties. 8. Return for follow-up in 3 months. 9. Call us if you have any questions or concerns.

## 2017-05-05 NOTE — Progress Notes (Signed)
Referring Provider: Renee Rival, NP Primary Care Physician:  Moshe Cipro, MD Primary GI:  Dr. Oneida Alar  Chief Complaint  Patient presents with  . Gastroesophageal Reflux    worse qpm, Pt wants to discuss changing med  . Dysphagia    bothersome when eating    HPI:   Chloe Golden is a 71 y.o. female who presents is here for follow-up on GERD and dysphasia.  The patient was last seen in our office 10/28/2016 for the same.  He is been seen by [facility] functional gut disorders with a diagnosis of irritable bowel syndrome and functional dyspepsia.  Recommended adding BuSpar to her medications and anorectal manometry.  She did not follow-up with GI as recommended.  At her last visit with [place] she was doing well until about a year prior.  She was previously on Nexium and then switched to [place] by primary care which had helped.  However she has worsening nighttime symptoms and bloating.  Tried dietary avoidance, also taking Zantac twice a day and Rolaids.  Minimal daytime symptoms unless she bends forward.  Dysphasia for the past 1-2 years worse with bread and meats.  No other GI symptoms.  Recommended continue Zantac every other evening, continue Dexilant, viscous lidocaine sent in for breakthrough symptoms, upper endoscopy with possible dilation, consider stopping Mobic as it would likely worsen symptoms, return for follow-up in 3 months.  EGD was completed 11/27/2016 and found 9 appearing esophageal stenosis status post dilation, gastritis due to Mobic.  Recommended reflux trigger avoidance, continue current medications, weight loss, follow-up in 4 months.  Today she states she's doing ok overall. Dysphagia has returned like no dilation was done before. She's still having GERD symptoms worse in the evening. She is still on Mobic but states she will stop taking it now. Symptoms are worse in the evenings. Generally variable in occurrence depending on what she eats. Often eats at church and can't  tell what's in some things. Bloating improved. Some continued abdominal pain. Had N/V 3 nights in a row with GERD symptoms. Tried Lidocaine which "was nasty tasting". Denies hematochezia, melena. Denies chest pain, dyspnea, dizziness, lightheadedness, syncope, near syncope. Denies any other upper or lower GI symptoms.  NOTE: Patient PMH and [place] incomplete in this note due to computer issues. See history tab for complete information. History tab information was reviewed with the patient and found to be correct.  Past Medical History:  Diagnosis Date  . Acid reflux   . Arthritis   . Degenerative arthritis of cervical spine 1994  . Degenerative arthritis of cervical spine   . Fibromyalgia   . Functional gastrointestinal disorder   . GERD (gastroesophageal reflux disease)   . Hiatal hernia   . Hypertension   . Neuropathy   . Rheumatoid arthritis(714.0)     Past Surgical History:  Procedure Laterality Date  . ABDOMINAL HYSTERECTOMY    . BLADDER SURGERY    . CARPAL TUNNEL RELEASE    . CHOLECYSTECTOMY      Current Outpatient Medications  Medication Sig Dispense Refill  . amLODipine (NORVASC) 5 MG tablet Take 1 tablet daily by mouth.    . busPIRone (BUSPAR) 10 MG tablet Take 10 mg by mouth 2 (two) times daily.     . cetirizine (ZYRTEC) 10 MG tablet Take 10 mg by mouth daily as needed for allergies.    . Coenzyme Q10 (COQ-10) 100 MG CAPS Take 100 mg by mouth daily.    Marland Kitchen dexlansoprazole (DEXILANT)  60 MG capsule Take 60 mg by mouth daily.     Marland Kitchen docusate sodium (COLACE) 100 MG capsule Take 200 mg by mouth 2 (two) times daily.     Marland Kitchen ezetimibe (ZETIA) 10 MG tablet Take 10 mg by mouth at bedtime.     . lidocaine (XYLOCAINE) 2 % solution Use as directed 10 mLs in the mouth or throat every 6 (six) hours as needed (breakthrough reflux symptoms). 100 mL 0  . linaclotide (LINZESS) 72 MCG capsule Take 72 mcg by mouth as needed (Constipation).     Marland Kitchen LIVALO 1 MG TABS Take 1 tablet every other day by  mouth.    . losartan-hydrochlorothiazide (HYZAAR) 50-12.5 MG per tablet Take 1 tablet by mouth daily.     Marland Kitchen LYRICA 50 MG capsule Take 1 capsule 2 (two) times daily by mouth.    . meloxicam (MOBIC) 15 MG tablet Take 15 mg by mouth daily.    . metFORMIN (GLUCOPHAGE) 500 MG tablet Take 500 mg by mouth daily.    . nebivolol (BYSTOLIC) 5 MG tablet Take 5 mg by mouth at bedtime.    . Omega-3 Fatty Acids (OMEGA 3 500) 500 MG CAPS Take 500 mg by mouth daily.    Marland Kitchen oxybutynin (DITROPAN-XL) 10 MG 24 hr tablet Take 10 mg by mouth daily.    Marland Kitchen tiZANidine (ZANAFLEX) 2 MG tablet Take 1 tablet daily by mouth.    . traMADol (ULTRAM) 50 MG tablet Take 1 tablet 2 (two) times daily by mouth.    . traZODone (DESYREL) 100 MG tablet Take 1 tablet at bedtime by mouth.    . venlafaxine (EFFEXOR) 37.5 MG tablet Take 37.5 mg by mouth 2 (two) times daily.     No current facility-administered medications for this visit.     Allergies as of 05/05/2017 - Review Complete 05/05/2017  Allergen Reaction Noted  . Ciprofloxacin Nausea And Vomiting and Other (See Comments) 09/26/2012  . Codeine Other (See Comments) 09/26/2012  . Dicyclomine  01/05/2013  . Ibuprofen Other (See Comments) 09/26/2012  . Sulfa antibiotics  11/24/2016  . Valium [diazepam] Other (See Comments) 09/26/2012    Family History  Problem Relation Age of Onset  . Lung cancer Father   . Crohn's disease Cousin   . Colon cancer Neg Hx   . Celiac disease Neg Hx   . Gastric cancer Neg Hx   . Esophageal cancer Neg Hx     Social History   Socioeconomic History  . Marital status: Single    Spouse name: None  . Number of children: None  . Years of education: None  . Highest education level: None  Social Needs  . Financial resource strain: None  . Food insecurity - worry: None  . Food insecurity - inability: None  . Transportation needs - medical: None  . Transportation needs - non-medical: None  Occupational History  . None  Tobacco Use  .  Smoking status: Never Smoker  . Smokeless tobacco: Never Used  Substance and Sexual Activity  . Alcohol use: No  . Drug use: No  . Sexual activity: No  Other Topics Concern  . None  Social History Narrative   1 living   2 deceased    Review of Systems: Complete ROS negative except as per HPI.   Physical Exam: BP (!) 153/86   Pulse 83   Temp (!) 97.4 F (36.3 C) (Oral)   Ht 5\' 4"  (1.626 m)   Wt 199 lb (90.3 kg)  BMI 34.16 kg/m  General:   Obese female. Alert and oriented. Pleasant and cooperative. Well-nourished and well-developed.  Eyes:  Without icterus, sclera clear and conjunctiva pink.  Ears:  Normal auditory acuity. Cardiovascular:  S1, S2 present without murmurs appreciated. Extremities without clubbing or edema. Respiratory:  Clear to auscultation bilaterally. No wheezes, rales, or rhonchi. No distress.  Gastrointestinal:  +BS, obese but soft, non-tender and non-distended. No HSM noted. No guarding or rebound. No masses appreciated.  Rectal:  Deferred  Musculoskalatal:  Symmetrical without gross deformities. Neurologic:  Alert and oriented x4;  grossly normal neurologically. Psych:  Alert and cooperative. Normal mood and affect. Heme/Lymph/Immune: No excessive bruising noted.    05/05/2017 3:32 PM   Disclaimer: This note was dictated with voice recognition software. Similar sounding words can inadvertently be transcribed and may not be corrected upon review.

## 2017-05-06 NOTE — Progress Notes (Signed)
cc'ed to pcp °

## 2017-06-17 ENCOUNTER — Other Ambulatory Visit: Payer: Self-pay | Admitting: *Deleted

## 2017-06-17 ENCOUNTER — Ambulatory Visit (HOSPITAL_COMMUNITY)
Admission: RE | Admit: 2017-06-17 | Discharge: 2017-06-17 | Disposition: A | Discharge status: Home / Self Care | Payer: Medicare Other | Source: Ambulatory Visit | Attending: Nurse Practitioner | Admitting: Nurse Practitioner

## 2017-06-17 ENCOUNTER — Ambulatory Visit
Admission: RE | Admit: 2017-06-17 | Discharge: 2017-06-17 | Disposition: A | Discharge status: Home / Self Care | Payer: Medicare Other | Source: Ambulatory Visit | Attending: Nurse Practitioner | Admitting: Nurse Practitioner

## 2017-06-17 DIAGNOSIS — R1310 Dysphagia, unspecified: Secondary | ICD-10-CM | POA: Insufficient documentation

## 2017-06-17 DIAGNOSIS — R1319 Other dysphagia: Secondary | ICD-10-CM

## 2017-06-17 DIAGNOSIS — K219 Gastro-esophageal reflux disease without esophagitis: Secondary | ICD-10-CM

## 2017-06-23 NOTE — Progress Notes (Signed)
Pt is aware.  

## 2017-06-24 ENCOUNTER — Encounter: Payer: Self-pay | Admitting: Gastroenterology

## 2017-09-09 ENCOUNTER — Encounter: Payer: Self-pay | Admitting: Gastroenterology

## 2017-09-09 ENCOUNTER — Ambulatory Visit (INDEPENDENT_AMBULATORY_CARE_PROVIDER_SITE_OTHER): Payer: Medicare Other | Admitting: Gastroenterology

## 2017-09-09 DIAGNOSIS — K591 Functional diarrhea: Secondary | ICD-10-CM

## 2017-09-09 DIAGNOSIS — R1310 Dysphagia, unspecified: Secondary | ICD-10-CM | POA: Diagnosis not present

## 2017-09-09 DIAGNOSIS — K219 Gastro-esophageal reflux disease without esophagitis: Secondary | ICD-10-CM | POA: Diagnosis not present

## 2017-09-09 DIAGNOSIS — R1319 Other dysphagia: Secondary | ICD-10-CM

## 2017-09-09 NOTE — Progress Notes (Signed)
Subjective:    Patient ID: Chloe Golden, female    DOB: [birth year], 72 y.o.   MRN: [id number]  Moshe Cipro, MD   HPI Acid reflux uncontrolled sometime but controlled most of the time. HAVING TROUBLE WITH HER UTIs. MAY FEEL HEARTBURN <2X/WEEK. JUST GETS SICK: SEVER NAUSEA AND BURNING IN HER EPIGASTRIUM AND CHEST. MAY GET REGURGITATION AT NIGHT ONCE Q2-3 MOS AND EARS WILL BURN.  SLEEPS WITH BED ELEVATED AND WANTS A WEDGE. SWALLOWING PROBLEMS WITH BREAD/BEEF. HAD EGD/DIL THOUGH IT HELPED FOR 3-4 WEEKS THEN IT DIDN'T AND NOW JUST DOESN'T KNOW.  PT DENIES FEVER, CHILLS, HEMATOCHEZIA, HEMATEMESIS, vomiting, melena, diarrhea, SHORTNESS OF BREATH,  CHANGE IN BOWEL IN HABITS, constipation, OR problems with sedation  Past Medical History:  Diagnosis Date  . Acid reflux   . Arthritis   . Degenerative arthritis of cervical spine 1994  . Degenerative arthritis of cervical spine   . Fibromyalgia   . Functional gastrointestinal disorder   . GERD (gastroesophageal reflux disease)   . Hiatal hernia   . Hypertension   . Neuropathy   . Rheumatoid arthritis(714.0)     Past Surgical History:  Procedure Laterality Date  . ABDOMINAL HYSTERECTOMY    . BACTERIAL OVERGROWTH TEST N/A 11/09/2012   Procedure: BACTERIAL OVERGROWTH TEST;  Surgeon: Danie Binder, MD;  [facility];  Service: Endoscopy;  Laterality: N/A;  7:30  . BIOPSY N/A 10/28/2012   Procedure: BIOPSY;  Surgeon: Danie Binder, MD;  [facility];  Service: Endoscopy;  Laterality: N/A;  DUODENAL BIOPSY  . BLADDER SURGERY    . CARPAL TUNNEL RELEASE    . CHOLECYSTECTOMY    . COLONOSCOPY WITH ESOPHAGOGASTRODUODENOSCOPY (EGD) N/A 10/28/2012   [id number] diverticulosis in the descending colon and sigmoid colon/Small internal hemorrhoids  . ESOPHAGOGASTRODUODENOSCOPY N/A 11/27/2016   Procedure: ESOPHAGOGASTRODUODENOSCOPY (EGD);  Surgeon: Danie Binder, MD;  [facility];  Service: Endoscopy;  Laterality:  N/A;  830   . ESOPHAGOGASTRODUODENOSCOPY (EGD) WITH ESOPHAGEAL DILATION N/A 10/28/2012   [id number] CERVIAL WEB, S/P DILATION/MILD Non-erosive gastritis & DUODENITIS  . SAVORY DILATION N/A 11/27/2016   Procedure: SAVORY DILATION;  Surgeon: Danie Binder, MD;  [facility];  Service: Endoscopy;  Laterality: N/A;    Allergies  Allergen Reactions  . Ciprofloxacin Nausea And Vomiting and Other (See Comments)    possible yeast infection  . Codeine Other (See Comments)    Constipation  . Dicyclomine     TURNED HER SKIN BLUE  . Ibuprofen Other (See Comments)    Ulcers   . Sulfa Antibiotics     Yeast infections   . Valium [Diazepam] Other (See Comments)    Alters mental status    Current Outpatient Medications  Medication Sig    . amLODipine (NORVASC) 5 MG tablet Take 1 tablet daily by mouth.    . busPIRone (BUSPAR) 10 MG tablet Take 10 mg by mouth 2 (two) times daily.     . cetirizine (ZYRTEC) 10 MG tablet Take 10 mg by mouth daily as needed for allergies.    . Coenzyme Q10 (COQ-10) 100 MG CAPS Take 100 mg by mouth daily.    Marland Kitchen dexlansoprazole (DEXILANT) 60 MG capsule Take 60 mg by mouth daily.     Marland Kitchen docusate sodium (COLACE) 100 MG capsule Take 200 mg by mouth 2 (two) times daily.     Marland Kitchen ezetimibe (ZETIA) 10 MG tablet Take 10 mg by mouth at bedtime.     Marland Kitchen  lidocaine (XYLOCAINE) 2 % solution Use as directed 10 mLs in the mouth or throat every 6 (six) hours as needed (breakthrough reflux symptoms).    Marland Kitchen linaclotide (LINZESS) 72 MCG capsule Take 72 mcg by mouth as needed (Constipation).     Marland Kitchen LIVALO 1 MG TABS Take 1 tablet every other day by mouth.    . losartan-hydrochlorothiazide (HYZAAR) 50-12.5 MG per tablet Take 1 tablet by mouth daily.     Marland Kitchen LYRICA 50 MG capsule Take 1 capsule 2 (two) times daily by mouth.    . meloxicam (MOBIC) 15 MG tablet Take 15 mg by mouth daily.    . metFORMIN (GLUCOPHAGE) 500 MG tablet Take 500 mg by mouth daily.    . methocarbamol (ROBAXIN) 500 MG  tablet Take 500 mg by mouth 2 (two) times daily as needed for muscle spasms.    . nebivolol (BYSTOLIC) 5 MG tablet Take 5 mg by mouth at bedtime.    . Omega-3 Fatty Acids (OMEGA 3 500) 500 MG CAPS Take 500 mg by mouth daily.    Marland Kitchen oxybutynin (DITROPAN-XL) 10 MG 24 hr tablet Take 10 mg by mouth daily.    . ranitidine (ZANTAC) 150 MG tablet Take 1 tablet (150 mg total) every other day by mouth. In the evening (Patient taking differently: Take 150 mg by mouth as needed. In the evening)    . tiZANidine (ZANAFLEX) 2 MG tablet Take 1 tablet daily by mouth.    . traZODone (DESYREL) 100 MG tablet Take 1 tablet at bedtime by mouth.    . venlafaxine (EFFEXOR) 37.5 MG tablet Take 37.5 mg by mouth 2 (two) times daily.    . traMADol (ULTRAM) 50 MG tablet Take 1 tablet 2 (two) times daily by mouth.     Review of Systems PER HPI OTHERWISE ALL SYSTEMS ARE NEGATIVE.    Objective:   Physical Exam  Constitutional: She is oriented to person, place, and time. She appears well-developed and well-nourished. No distress.  HENT:  Head: Normocephalic and atraumatic.  Mouth/Throat: Oropharynx is clear and moist. No oropharyngeal exudate.  Eyes: Pupils are equal, round, and reactive to light. No scleral icterus.  Neck: Normal range of motion. Neck supple.  Cardiovascular: Normal rate, regular rhythm and normal heart sounds.  Pulmonary/Chest: Effort normal and breath sounds normal. No respiratory distress.  Abdominal: Soft. Bowel sounds are normal. She exhibits no distension. There is no tenderness.  Musculoskeletal: She exhibits no edema.  Lymphadenopathy:    She has no cervical adenopathy.  Neurological: She is alert and oriented to person, place, and time.  NO  NEW FOCAL DEFICITS  Psychiatric: She has a normal mood and affect.  Vitals reviewed.     Assessment & Plan:

## 2017-09-09 NOTE — Assessment & Plan Note (Signed)
SYMPTOMS CONTROLLED/RESOLVED.  CONTINUE TO MONITOR SYMPTOMS. 

## 2017-09-09 NOTE — Patient Instructions (Addendum)
FOLLOW A LOW FAT/SOFT MECHANICAL DIET. SEE INFO BELOW ON A SOFT MECHANICAL.  MEATS SHOULD BE CHOPPED OR GROUND ONLY. DO NOT EAT CHUNKS OF ANYTHING.  AVOID REFLUX TRIGGERS. SEE INFO BELOW.  CONTINUE DEXILANT.  USE ZANTAC WHEN NEEDED TO CONTROL HEARTBURN.  FOLLOW UP IN 6 MOS.   PLEASE CALL WITH QUESTIONS OR CONCERNS OR IF YOU WANT TO SEE THE SWALLOWING EXPERT AT [facility].  SOFT MECHANICAL DIET This SOFT MECHANICAL DIET is restricted to:  Foods that are moist, soft-textured, and easy to chew and swallow.   Meats that are ground or are minced no larger than one-quarter inch pieces. Meats are moist with gravy or sauce added.   Foods that do not include bread or bread-like textures except soft pancakes, well-moistened with syrup or sauce.   Textures with some chewing ability required.   Casseroles without rice.   Cooked vegetables that are less than half an inch in size and easily mashed with a fork. No cooked corn, peas, broccoli, cauliflower, cabbage, Brussels sprouts, asparagus, or other fibrous, non-tender or rubbery cooked vegetables.   Canned fruit except for pineapple. Fruit must be cut into pieces no larger than half an inch in size.   Foods that do not include nuts, seeds, coconut, or sticky textures.   FOOD TEXTURES FOR DYSPHAGIA DIET LEVEL 2 -SOFT MECHANICAL DIET (includes all foods on Dysphagia Diet Level 1 - Pureed, in addition to the foods listed below)  FOOD GROUP: Breads. RECOMMENDED: Soft pancakes, well-moistened with syrup or sauce.  AVOID: All others.  FOOD GROUP: Cereals.  RECOMMENDED: Cooked cereals with little texture, including oatmeal. Unprocessed wheat bran stirred into cereals for bulk. Note: If thin liquids are restricted, it is important that all of the liquid is absorbed into the cereal.  AVOID: All dry cereals and any cooked cereals that may contain flax seeds or other seeds or nuts. Whole-grain, dry, or coarse cereals. Cereals with nuts, seeds, dried  fruit, and/or coconut.  FOOD GROUP: Desserts. RECOMMENDED: Pudding, custard. Soft fruit pies with bottom crust only. Canned fruit (excluding pineapple). Soft, moist cakes with icing.Frozen malts, milk shakes, frozen yogurt, eggnog, nutritional supplements, ice cream, sherbet, regular or sugar-free gelatin, or any foods that become thin liquid at either room (70 F) or body temperature (98 F).  AVOID: Dry, coarse cakes and cookies. Anything with nuts, seeds, coconut, pineapple, or dried fruit. Breakfast yogurt with nuts. Rice or bread pudding.  FOOD GROUP: Fats. RECOMMENDED: Butter, margarine, cream for cereal (depending on liquid consistency recommendations), gravy, cream sauces, sour cream, sour cream dips with soft additives, mayonnaise, salad dressings, cream cheese, cream cheese spreads with soft additives, whipped toppings.  AVOID: All fats with coarse or chunky additives.  FOOD GROUP: Fruits. RECOMMENDED: Soft drained, canned, or cooked fruits without seeds or skin. Fresh soft and ripe banana. Fruit juices with a small amount of pulp. If thin liquids are restricted, fruit juices should be thickened to appropriate consistency.  AVOID: Fresh or frozen fruits. Cooked fruit with skin or seeds. Dried fruits. Fresh, canned, or cooked pineapple.  FOOD GROUP: Meats and Meat Substitutes. (Meat pieces should not exceed 1/4 of an inch cube and should be tender.) RECOMMENDED: Moistened ground or cooked meat, poultry, or fish. Moist ground or tender meat may be served with gravy or sauce. Casseroles without rice. Moist macaroni and cheese, well-cooked pasta with meat sauce, tuna noodle casserole, soft, moist lasagna. Moist meatballs, meatloaf, or fish loaf. Protein salads, such as tuna or egg without large chunks,  celery, or onion. Cottage cheese, smooth quiche without large chunks. Poached, scrambled, or soft-cooked eggs (egg yolks should not be "runny" but should be moist and able to be mashed with  butter, margarine, or other moisture added to them). (Cook eggs to 160 F or use pasteurized eggs for safety.) Souffls may have small, soft chunks. Tofu. Well-cooked, slightly mashed, moist legumes, such as baked beans. All meats or protein substitutes should be served with sauces or moistened to help maintain cohesiveness in the oral cavity.  AVOID: Dry meats, tough meats (such as bacon, sausage, hot dogs, bratwurst). Dry casseroles or casseroles with rice or large chunks. Peanut butter. Cheese slices and cubes. Hard-cooked or crisp fried eggs. Sandwiches.Pizza.  FOOD GROUP: Potatoes and Starches. RECOMMENDED: Well-cooked, moistened, boiled, baked, or mashed potatoes. Well-cooked shredded hash brown potatoes that are not crisp. (All potatoes need to be moist and in sauces.)Well-cooked noodles in sauce. Spaetzel or soft dumplings that have been moistened with butter or gravy.  AVOID: Potato skins and chips. Fried or French-fried potatoes. Rice.  FOOD GROUP: Soups. RECOMMENDED: Soups with easy-to-chew or easy-to-swallow meats or vegetables: Particle sizes in soups should be less than 1/2 inch. Soups will need to be thickened to appropriate consistency if soup is thinner than prescribed liquid consistency.  AVOID: Soups with large chunks of meat and vegetables. Soups with rice, corn, peas.  FOOD GROUP: Vegetables. RECOMMENDED: All soft, well-cooked vegetables. Vegetables should be less than a half inch. Should be easily mashed with a fork.  AVOID: Cooked corn and peas. Broccoli, cabbage, Brussels sprouts, asparagus, or other fibrous, non-tender or rubbery cooked vegetables.  FOOD GROUP: Miscellaneous. RECOMMENDED: Jams and preserves without seeds, jelly. Sauces, salsas, etc., that may have small tender chunks less than 1/2 inch. Soft, smooth chocolate bars that are easily chewed.  AVOID: Seeds, nuts, coconut, or sticky foods. Chewy candies such as caramels or licorice.

## 2017-09-09 NOTE — Assessment & Plan Note (Signed)
SYMPTOMS FAIRLY WELL CONTROLLED WITH [facility] DEC 2018 SHOWING DELAY AT [facility].  FOLLOW A LOW FAT/SOFT MECHANICAL DIET.  HANDOUT GIVEN ON A SOFT MECHANICAL.  MEATS SHOULD BE CHOPPED OR GROUND ONLY. DO NOT EAT CHUNKS OF ANYTHING. AVOID REFLUX TRIGGERS.  HANDOUT GIVEN. CONTINUE DEXILANT. USE ZANTAC WHEN NEEDED TO CONTROL HEARTBURN. FOLLOW UP IN 6 MOS.  CALL WITH QUESTIONS OR CONCERNS OR IF YOU WANT TO SEE THE SWALLOWING EXPERT AT [facility].

## 2017-09-09 NOTE — Assessment & Plan Note (Signed)
SYMPTOMS FAIRLY WELL CONTROLLED WITH DEXILANT DAILY AND ZANTAC PRN.  FOLLOW A LOW FAT/SOFT MECHANICAL DIET. MEATS SHOULD BE CHOPPED OR GROUND ONLY. DO NOT EAT CHUNKS OF ANYTHING. AVOID REFLUX TRIGGERS.  CONTINUE DEXILANT. USE ZANTAC WHEN NEEDED TO CONTROL HEARTBURN. FOLLOW UP IN 6 MOS.  CALL WITH QUESTIONS OR CONCERNS

## 2017-09-10 NOTE — Progress Notes (Signed)
ON RECALL  °

## 2017-09-13 NOTE — Progress Notes (Signed)
cc'ed to pcp °

## 2018-01-28 ENCOUNTER — Encounter: Payer: Self-pay | Admitting: Gastroenterology

## 2018-03-04 ENCOUNTER — Other Ambulatory Visit (HOSPITAL_COMMUNITY): Payer: Self-pay | Admitting: Obstetrics & Gynecology

## 2018-03-04 DIAGNOSIS — M5416 Radiculopathy, lumbar region: Secondary | ICD-10-CM

## 2018-03-10 ENCOUNTER — Ambulatory Visit (HOSPITAL_COMMUNITY)
Admission: RE | Admit: 2018-03-10 | Discharge: 2018-03-10 | Disposition: A | Discharge status: Home / Self Care | Payer: Medicare Other | Source: Ambulatory Visit | Attending: Obstetrics & Gynecology | Admitting: Obstetrics & Gynecology

## 2018-03-10 DIAGNOSIS — M5136 Other intervertebral disc degeneration, lumbar region: Secondary | ICD-10-CM | POA: Diagnosis not present

## 2018-03-10 DIAGNOSIS — M5416 Radiculopathy, lumbar region: Secondary | ICD-10-CM | POA: Diagnosis present

## 2018-03-10 DIAGNOSIS — M48061 Spinal stenosis, lumbar region without neurogenic claudication: Secondary | ICD-10-CM | POA: Diagnosis not present

## 2018-03-14 ENCOUNTER — Ambulatory Visit (HOSPITAL_COMMUNITY): Payer: Medicare Other

## 2020-02-07 DIAGNOSIS — G8929 Other chronic pain: Secondary | ICD-10-CM | POA: Insufficient documentation

## 2020-02-07 DIAGNOSIS — M25561 Pain in right knee: Secondary | ICD-10-CM | POA: Insufficient documentation

## 2020-05-05 ENCOUNTER — Encounter: Payer: Self-pay | Admitting: Cardiology

## 2020-05-05 NOTE — Progress Notes (Signed)
Cardiology Office Note  Date: 05/06/2020   ID: Chloe Golden, DOB 26-Nov-1945, MRN [id number]  PCP:  Moshe Cipro, MD  Cardiologist:  Rozann Lesches, MD Electrophysiologist:  None   Chief Complaint  Patient presents with  . Leg Pain    History of Present Illness: Chloe Golden is a 74 y.o. female referred for cardiology consultation by Dr. Currie Paris for assessment of chronic leg pain.Marland Kitchen  She was previously evaluated by [facility], saw Dr. Sondra Come in August of this year.  Records indicate adenosine Cardiolite in June 2019 showing normal perfusion without ischemia, LVEF 66%.  Echocardiogram done in July 2019 also reported normal LVEF, mild diastolic dysfunction, normal RV contraction, trace mitral and tricuspid regurgitation.  She does have a history of reportedly mild PAD as of 2019, also venous reflux.  She states that she had interval studies however that suggest she needed "stents" in her legs.  Bilateral leg pain has been chronic, also in the setting of known peripheral neuropathy which is treated medically as noted below.  She just recently had a right total knee replacement about 5 weeks ago as well, using a cane.  She reports fluctuating leg swelling, right worse than left.  Her feet feel cold and bother her at nighttime.  She has history of multi-statin intolerance per outside records.  At the present time she is on a combination of Zetia and Livalo.  Requesting most recent records from PCP.   Past Medical History:  Diagnosis Date  . Acid reflux   . Arthritis   . Degenerative arthritis of cervical spine   . Fibromyalgia   . Functional gastrointestinal disorder   . GERD (gastroesophageal reflux disease)   . Hiatal hernia   . Hypertension   . Neuropathy   . OSA on CPAP   . PAD (peripheral artery disease) (HCC)    Mild by arterial Dopplers April 2019  . Rheumatoid arthritis(714.0)   . Venous reflux     Past Surgical History:  Procedure  Laterality Date  . ABDOMINAL HYSTERECTOMY    . BACTERIAL OVERGROWTH TEST N/A 11/09/2012   Procedure: BACTERIAL OVERGROWTH TEST;  Surgeon: Danie Binder, MD;  [facility];  Service: Endoscopy;  Laterality: N/A;  7:30  . BIOPSY N/A 10/28/2012   Procedure: BIOPSY;  Surgeon: Danie Binder, MD;  [facility];  Service: Endoscopy;  Laterality: N/A;  DUODENAL BIOPSY  . BLADDER SURGERY    . CARPAL TUNNEL RELEASE    . CHOLECYSTECTOMY    . COLONOSCOPY WITH ESOPHAGOGASTRODUODENOSCOPY (EGD) N/A 10/28/2012   [id number] diverticulosis in the descending colon and sigmoid colon/Small internal hemorrhoids  . ESOPHAGOGASTRODUODENOSCOPY N/A 11/27/2016   Procedure: ESOPHAGOGASTRODUODENOSCOPY (EGD);  Surgeon: Danie Binder, MD;  [facility];  Service: Endoscopy;  Laterality: N/A;  830   . ESOPHAGOGASTRODUODENOSCOPY (EGD) WITH ESOPHAGEAL DILATION N/A 10/28/2012   [id number] CERVIAL WEB, S/P DILATION/MILD Non-erosive gastritis & DUODENITIS  . SAVORY DILATION N/A 11/27/2016   Procedure: SAVORY DILATION;  Surgeon: Danie Binder, MD;  [facility];  Service: Endoscopy;  Laterality: N/A;  . TOTAL KNEE ARTHROPLASTY Right     Current Outpatient Medications  Medication Sig Dispense Refill  . amLODipine (NORVASC) 10 MG tablet Take 10 mg by mouth daily.    . baclofen (LIORESAL) 10 MG tablet Take 10 mg by mouth 2 (two) times daily.    . busPIRone (BUSPAR) 10 MG tablet Take 10 mg by mouth 2 (two)  times daily.     . cetirizine (ZYRTEC) 10 MG tablet Take 10 mg by mouth daily as needed for allergies.    . Coenzyme Q10 (COQ-10) 100 MG CAPS Take 100 mg by mouth daily.    Marland Kitchen dexlansoprazole (DEXILANT) 60 MG capsule Take 60 mg by mouth daily.     Marland Kitchen docusate sodium (COLACE) 100 MG capsule Take 200 mg by mouth 2 (two) times daily.     Marland Kitchen ezetimibe (ZETIA) 10 MG tablet Take 10 mg by mouth at bedtime.     . FeFum-FePo-FA-B Cmp-C-Zn-Mn-Cu (TANDEM PLUS) 162-115.2-1 MG CAPS Take 1 capsule  by mouth daily.    . furosemide (LASIX) 20 MG tablet Take 20 mg by mouth daily.    Marland Kitchen HYDROcodone-acetaminophen (NORCO/VICODIN) 5-325 MG tablet Take 1 tablet by mouth every 6 (six) hours as needed.    . hydroxychloroquine (PLAQUENIL) 200 MG tablet Take 400 mg by mouth daily.    Marland Kitchen JANUVIA 100 MG tablet Take 100 mg by mouth daily.    Marland Kitchen lidocaine (XYLOCAINE) 2 % solution Use as directed 10 mLs in the mouth or throat every 6 (six) hours as needed (breakthrough reflux symptoms). 100 mL 0  . linaclotide (LINZESS) 72 MCG capsule Take 72 mcg by mouth as needed (Constipation).     Marland Kitchen LIVALO 1 MG TABS Take 1 tablet every other day by mouth.    . losartan-hydrochlorothiazide (HYZAAR) 50-12.5 MG per tablet Take 1 tablet by mouth daily.     Marland Kitchen LYRICA 50 MG capsule Take 1 capsule 2 (two) times daily by mouth.    Marland Kitchen MYRBETRIQ 50 MG TB24 tablet Take 50 mg by mouth daily.    . nebivolol (BYSTOLIC) 5 MG tablet Take 5 mg by mouth at bedtime.    . Omega-3 Fatty Acids (OMEGA 3 500) 500 MG CAPS Take 500 mg by mouth daily.    . ondansetron (ZOFRAN-ODT) 4 MG disintegrating tablet Take 4 mg by mouth every 8 (eight) hours as needed.    . pilocarpine (SALAGEN) 5 MG tablet Take 5 mg by mouth 3 (three) times daily as needed.    Marland Kitchen rOPINIRole (REQUIP) 0.5 MG tablet Take 1.5 mg by mouth at bedtime.    . traMADol (ULTRAM) 50 MG tablet Take 1 tablet 2 (two) times daily by mouth.    . traZODone (DESYREL) 100 MG tablet Take 1 tablet at bedtime by mouth.    . venlafaxine (EFFEXOR) 37.5 MG tablet Take 37.5 mg by mouth 2 (two) times daily.     No current facility-administered medications for this visit.   Allergies:  Ciprofloxacin, Codeine, Dicyclomine, Ibuprofen, Sulfa antibiotics, and Valium [diazepam]   Social History: The patient  reports that she has never smoked. She has never used smokeless tobacco. She reports that she does not drink alcohol and does not use drugs.   Family History: The patient's family history includes CAD  in her mother; Crohn's disease in her cousin; Diabetes Mellitus II in her father; Hypertension in her mother; Lung cancer in her father.   ROS: No exertional chest pain, no palpitations or syncope.  Reports compliance with CPAP at nighttime.  Physical Exam: VS:  BP (!) 110/58   Pulse 70   Ht 5\' 4"  (1.626 m)   Wt 201 lb 12.8 oz (91.5 kg)   SpO2 97%   BMI 34.64 kg/m , BMI Body mass index is 34.64 kg/m.  Wt Readings from Last 3 Encounters:  05/06/20 201 lb 12.8 oz (91.5 kg)  09/09/17 200  lb 3.2 oz (90.8 kg)  05/05/17 199 lb (90.3 kg)    General: Patient appears comfortable at rest.  Using a cane. HEENT: Conjunctiva and lids normal, wearing a mask. Neck: Supple, no elevated JVP or carotid bruits, no thyromegaly. Lungs: Clear to auscultation, nonlabored breathing at rest. Cardiac: Regular rate and rhythm, no S3, soft systolic murmur. Abdomen: Soft, nontender, bowel sounds present. Extremities: Venous stasis, mild lower leg edema right greater than left. Skin: Warm and dry. Musculoskeletal: No kyphosis. Neuropsychiatric: Alert and oriented x3, affect grossly appropriate.  ECG:  An ECG dated 09/27/2012 was personally reviewed today and demonstrated:  Normal sinus rhythm.  Recent Labwork:  No recent lab work for review today.  Other Studies Reviewed Today:  Outside cardiovascular studies are summarized above in the HPI.  Assessment and Plan:  1. Bilateral leg pain as described above.  Suspect that this is multifactorial based on available information.  She has a reported history of venous reflux, also PAD which may have progressed - I do not have her most recent Doppler studies for review.  She also has known neuropathy and recently underwent a right total knee replacement.  Plan is to obtain lower extremity venous and arterial Dopplers and determine if we need to pursue further vascular consultation.  2.  Mixed hyperlipidemia with multistatin intolerance.  She is currently on  combination of Zetia and Livalo with follow-up by PCP.  Requesting most recent lab work for review.  3.  Essential hypertension, blood pressure is well controlled today on Norvasc, Bystolic, and Hyzaar.  4.  OSA on CPAP.  Medication Adjustments/Labs and Tests Ordered: Current medicines are reviewed at length with the patient today.  Concerns regarding medicines are outlined above.   Tests Ordered: Orders Placed This Encounter  Procedures  . US Venous Img Lower Bilateral (DVT)  . US ARTERIAL SEG MULTIPLE LE (ABI, SEGMENTAL PRESSURES, PVR'S)    Medication Changes: No orders of the defined types were placed in this encounter.   Disposition:  Follow up test results and determine follow-up plan.  Signed, Satira Sark, MD, [facility] 05/06/2020 1:46 PM    [facility] at [facility] [address] [place], [place] [address] Phone: [phone]; Fax: [phone]

## 2020-05-06 ENCOUNTER — Other Ambulatory Visit: Payer: Self-pay

## 2020-05-06 ENCOUNTER — Encounter: Payer: Self-pay | Admitting: Cardiology

## 2020-05-06 ENCOUNTER — Ambulatory Visit (INDEPENDENT_AMBULATORY_CARE_PROVIDER_SITE_OTHER): Payer: 59 | Admitting: Cardiology

## 2020-05-06 VITALS — BP 110/58 | HR 70 | Ht 64.0 in | Wt 201.8 lb

## 2020-05-06 DIAGNOSIS — M7989 Other specified soft tissue disorders: Secondary | ICD-10-CM | POA: Diagnosis not present

## 2020-05-06 DIAGNOSIS — I10 Essential (primary) hypertension: Secondary | ICD-10-CM | POA: Diagnosis not present

## 2020-05-06 DIAGNOSIS — M79605 Pain in left leg: Secondary | ICD-10-CM | POA: Diagnosis not present

## 2020-05-06 DIAGNOSIS — I739 Peripheral vascular disease, unspecified: Secondary | ICD-10-CM | POA: Diagnosis not present

## 2020-05-06 NOTE — Patient Instructions (Addendum)
Medication Instructions:   Your physician recommends that you continue on your current medications as directed. Please refer to the Current Medication list given to you today.  Labwork:  None  Testing/Procedures: Your physician has requested that you have a lower or upper extremity venous duplex. This test is an ultrasound of the veins in the legs or arms. It looks at venous blood flow that carries blood from the heart to the legs or arms. Allow one hour for a Lower Venous exam. Allow thirty minutes for an Upper Venous exam. There are no restrictions or special instructions. Your physician has requested that you have a lower or upper extremity arterial duplex with ABI's. This test is an ultrasound of the arteries in the legs or arms. It looks at arterial blood flow in the legs and arms. Allow one hour for Lower and Upper Arterial scans. There are no restrictions or special instruction.  Follow-Up:  Your physician recommends that you schedule a follow-up appointment in: pending.   Any Other Special Instructions Will Be Listed Below (If Applicable).  If you need a refill on your cardiac medications before your next appointment, please call your pharmacy.

## 2020-05-10 ENCOUNTER — Ambulatory Visit (HOSPITAL_COMMUNITY): Admission: RE | Admit: 2020-05-10 | Payer: 59 | Source: Ambulatory Visit

## 2020-05-10 ENCOUNTER — Telehealth: Payer: Self-pay | Admitting: Cardiology

## 2020-05-10 ENCOUNTER — Encounter: Payer: Self-pay | Admitting: Cardiology

## 2020-05-10 DIAGNOSIS — M7989 Other specified soft tissue disorders: Secondary | ICD-10-CM

## 2020-05-10 NOTE — Telephone Encounter (Signed)
Patient contacted and advised that appointment for today has been canceled due to wrong order placed. My apologies given to patient. Patient aware that she would be contacted with appointment for venous reflux once its scheduled at one of the [facility] locations. Verbalized understanding.

## 2020-05-10 NOTE — Telephone Encounter (Signed)
received a call this morning about patient [id number] The vas lab at [facility] is questioning the orders for the procedures she is scheduled for this afternoon. Sent Dr. Domenic Polite a note. He said that his note for testing was very clear that he is looking for a Venous reflux. According to [facility] that test has to be scheduled at [facility].

## 2020-05-10 NOTE — Telephone Encounter (Signed)
Wrong order placed during visit New order placed for venous reflux

## 2020-05-10 NOTE — Addendum Note (Signed)
Addended by: Merlene Laughter on: 05/10/2020 12:07 PM   Modules accepted: Orders

## 2020-05-10 NOTE — Telephone Encounter (Signed)
Error

## 2020-05-13 ENCOUNTER — Ambulatory Visit (HOSPITAL_COMMUNITY): Payer: 59

## 2020-05-14 DIAGNOSIS — Z96651 Presence of right artificial knee joint: Secondary | ICD-10-CM | POA: Insufficient documentation

## 2020-05-20 ENCOUNTER — Other Ambulatory Visit: Payer: Self-pay

## 2020-05-20 ENCOUNTER — Ambulatory Visit (HOSPITAL_COMMUNITY)
Admission: RE | Admit: 2020-05-20 | Discharge: 2020-05-20 | Disposition: A | Discharge status: Home / Self Care | Payer: 59 | Source: Ambulatory Visit | Attending: Cardiology | Admitting: Cardiology

## 2020-05-20 DIAGNOSIS — M7989 Other specified soft tissue disorders: Secondary | ICD-10-CM | POA: Insufficient documentation

## 2020-05-24 ENCOUNTER — Telehealth: Payer: Self-pay | Admitting: Cardiology

## 2020-05-24 DIAGNOSIS — I739 Peripheral vascular disease, unspecified: Secondary | ICD-10-CM

## 2020-05-24 NOTE — Telephone Encounter (Signed)
Pt notified that [place] of legs has been ordered.

## 2020-05-24 NOTE — Telephone Encounter (Signed)
Patient called stating that she was under the impression she is supposed to be having more test on her legs.

## 2020-05-29 ENCOUNTER — Other Ambulatory Visit: Payer: Self-pay

## 2020-05-29 ENCOUNTER — Ambulatory Visit (HOSPITAL_COMMUNITY)
Admission: RE | Admit: 2020-05-29 | Discharge: 2020-05-29 | Disposition: A | Discharge status: Home / Self Care | Payer: 59 | Source: Ambulatory Visit | Attending: Cardiology | Admitting: Cardiology

## 2020-05-29 DIAGNOSIS — I739 Peripheral vascular disease, unspecified: Secondary | ICD-10-CM | POA: Diagnosis not present

## 2020-06-25 DIAGNOSIS — E119 Type 2 diabetes mellitus without complications: Secondary | ICD-10-CM | POA: Diagnosis not present

## 2020-06-25 DIAGNOSIS — M1711 Unilateral primary osteoarthritis, right knee: Secondary | ICD-10-CM | POA: Diagnosis not present

## 2020-06-25 DIAGNOSIS — M5136 Other intervertebral disc degeneration, lumbar region: Secondary | ICD-10-CM | POA: Diagnosis not present

## 2020-06-25 DIAGNOSIS — K219 Gastro-esophageal reflux disease without esophagitis: Secondary | ICD-10-CM | POA: Diagnosis not present

## 2020-06-25 DIAGNOSIS — E7800 Pure hypercholesterolemia, unspecified: Secondary | ICD-10-CM | POA: Diagnosis not present

## 2020-06-25 DIAGNOSIS — M7062 Trochanteric bursitis, left hip: Secondary | ICD-10-CM | POA: Diagnosis not present

## 2020-06-25 DIAGNOSIS — E785 Hyperlipidemia, unspecified: Secondary | ICD-10-CM | POA: Diagnosis not present

## 2020-06-25 DIAGNOSIS — G629 Polyneuropathy, unspecified: Secondary | ICD-10-CM | POA: Diagnosis not present

## 2020-06-25 DIAGNOSIS — R69 Illness, unspecified: Secondary | ICD-10-CM | POA: Diagnosis not present

## 2020-06-25 DIAGNOSIS — R413 Other amnesia: Secondary | ICD-10-CM | POA: Diagnosis not present

## 2020-06-25 DIAGNOSIS — M7061 Trochanteric bursitis, right hip: Secondary | ICD-10-CM | POA: Diagnosis not present

## 2020-06-25 DIAGNOSIS — I10 Essential (primary) hypertension: Secondary | ICD-10-CM | POA: Diagnosis not present

## 2020-06-25 DIAGNOSIS — E559 Vitamin D deficiency, unspecified: Secondary | ICD-10-CM | POA: Diagnosis not present

## 2020-06-25 DIAGNOSIS — G4733 Obstructive sleep apnea (adult) (pediatric): Secondary | ICD-10-CM | POA: Diagnosis not present

## 2020-06-25 DIAGNOSIS — M542 Cervicalgia: Secondary | ICD-10-CM | POA: Diagnosis not present

## 2020-06-25 DIAGNOSIS — E1140 Type 2 diabetes mellitus with diabetic neuropathy, unspecified: Secondary | ICD-10-CM | POA: Diagnosis not present

## 2020-06-25 DIAGNOSIS — M3500 Sicca syndrome, unspecified: Secondary | ICD-10-CM | POA: Diagnosis not present

## 2020-07-16 DIAGNOSIS — Z96651 Presence of right artificial knee joint: Secondary | ICD-10-CM | POA: Diagnosis not present

## 2020-07-17 DIAGNOSIS — R3915 Urgency of urination: Secondary | ICD-10-CM | POA: Diagnosis not present

## 2020-07-17 DIAGNOSIS — R351 Nocturia: Secondary | ICD-10-CM | POA: Diagnosis not present

## 2020-07-17 DIAGNOSIS — R300 Dysuria: Secondary | ICD-10-CM | POA: Diagnosis not present

## 2020-07-17 DIAGNOSIS — N3941 Urge incontinence: Secondary | ICD-10-CM | POA: Diagnosis not present

## 2020-07-17 DIAGNOSIS — R309 Painful micturition, unspecified: Secondary | ICD-10-CM | POA: Diagnosis not present

## 2020-07-17 DIAGNOSIS — R350 Frequency of micturition: Secondary | ICD-10-CM | POA: Diagnosis not present

## 2020-07-23 DIAGNOSIS — M25561 Pain in right knee: Secondary | ICD-10-CM | POA: Diagnosis not present

## 2020-07-25 ENCOUNTER — Other Ambulatory Visit: Payer: Self-pay | Admitting: Nurse Practitioner

## 2020-07-25 ENCOUNTER — Other Ambulatory Visit (HOSPITAL_COMMUNITY): Payer: Self-pay | Admitting: Nurse Practitioner

## 2020-07-25 DIAGNOSIS — R413 Other amnesia: Secondary | ICD-10-CM

## 2020-07-26 DIAGNOSIS — M25561 Pain in right knee: Secondary | ICD-10-CM | POA: Diagnosis not present

## 2020-07-29 DIAGNOSIS — M25561 Pain in right knee: Secondary | ICD-10-CM | POA: Diagnosis not present

## 2020-08-01 DIAGNOSIS — M25561 Pain in right knee: Secondary | ICD-10-CM | POA: Diagnosis not present

## 2020-08-05 DIAGNOSIS — M25561 Pain in right knee: Secondary | ICD-10-CM | POA: Diagnosis not present

## 2020-08-06 ENCOUNTER — Other Ambulatory Visit: Payer: Self-pay

## 2020-08-06 ENCOUNTER — Ambulatory Visit (HOSPITAL_COMMUNITY)
Admission: RE | Admit: 2020-08-06 | Discharge: 2020-08-06 | Disposition: A | Discharge status: Home / Self Care | Payer: Medicare HMO | Source: Ambulatory Visit | Attending: Nurse Practitioner | Admitting: Nurse Practitioner

## 2020-08-06 DIAGNOSIS — R413 Other amnesia: Secondary | ICD-10-CM

## 2020-08-06 IMAGING — CT CT HEAD W/O CM
3 series · 16 of 47 positions shown, 19 images · non-contrast
Comparison: None.

CLINICAL DATA: Short-term memory loss

EXAM:
CT HEAD WITHOUT CONTRAST
TECHNIQUE: Contiguous axial images were obtained from the base of the skull
through the vertex without intravenous contrast.

[Series 2: head w o · axial · 0.39mm/px · z∈[+24,+154]mm · 10 of 32 slices shown, 13 images]
[im 3/32  brain]
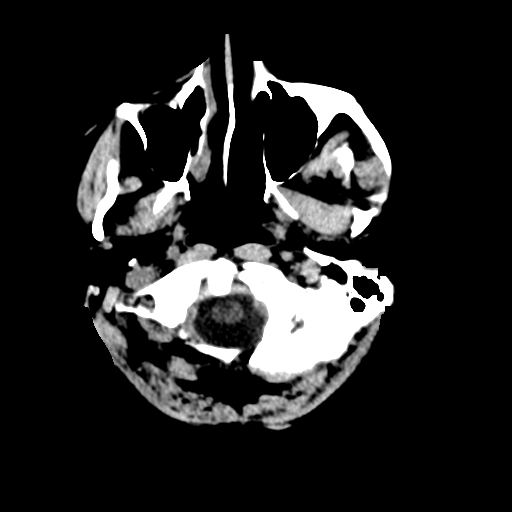
[im 3/32  bone]
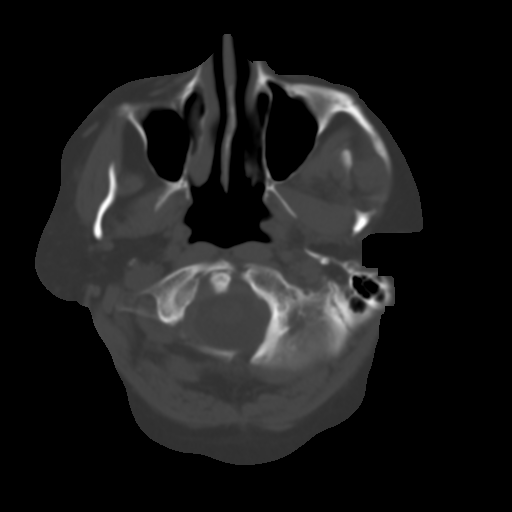
[im 6/32  brain]
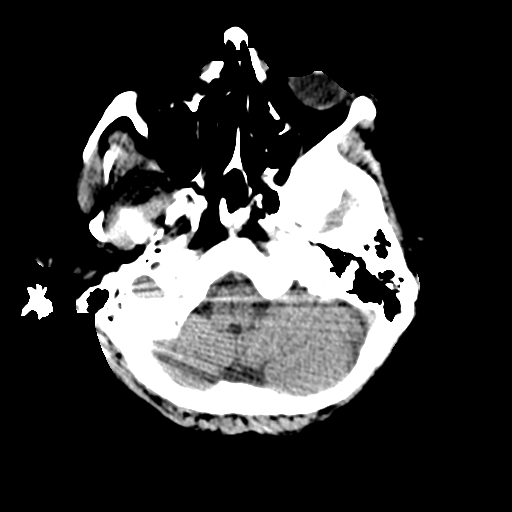
[im 9/32  brain]
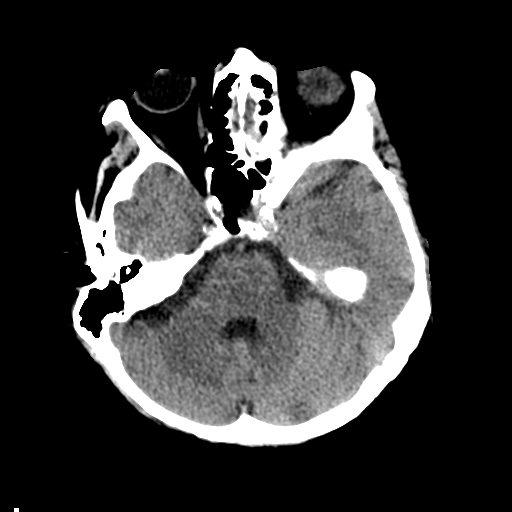
[im 11/32  brain]
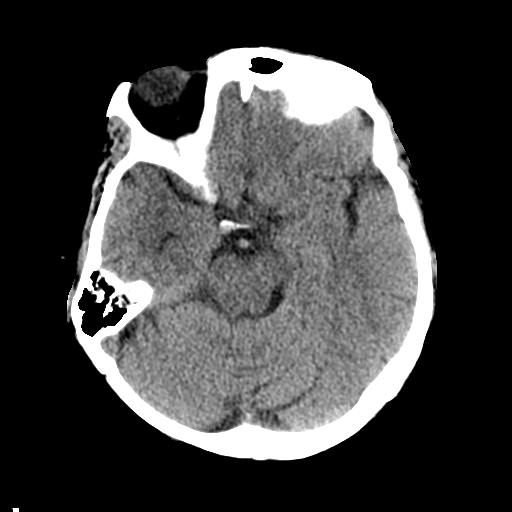
[im 14/32  brain]
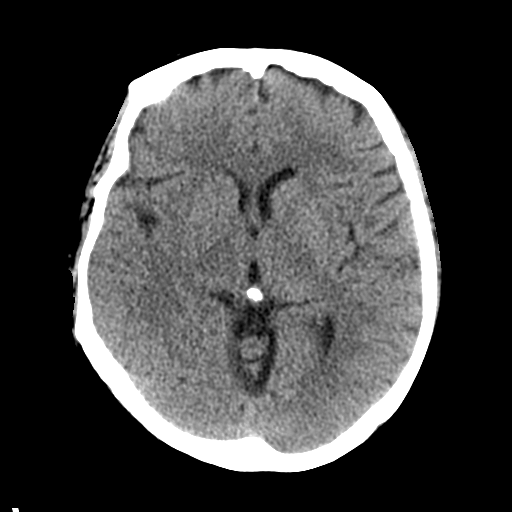
[im 14/32  bone]
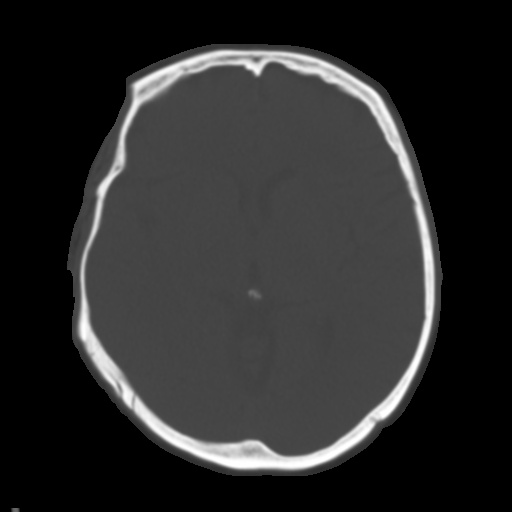
[im 18/32  brain]
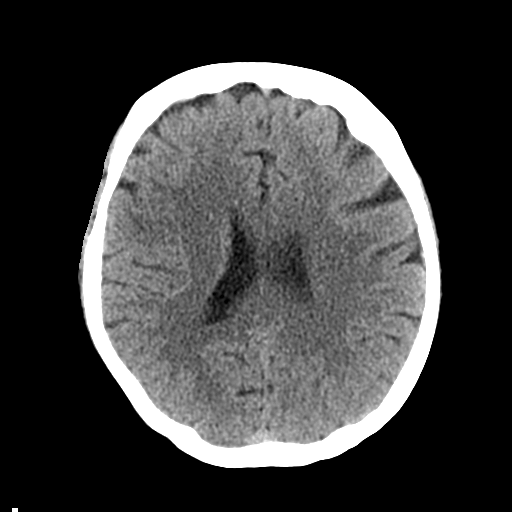
[im 21/32  brain]
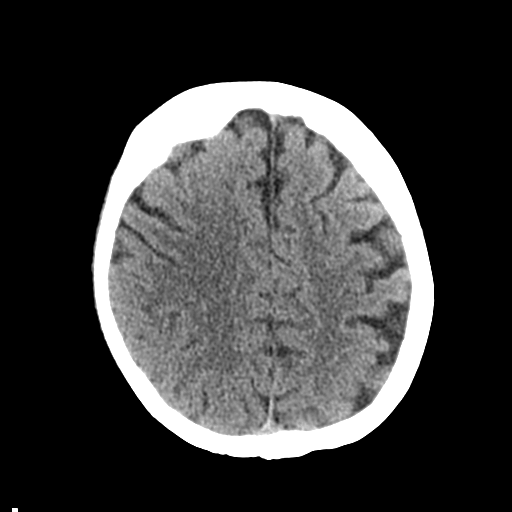
[im 24/32  brain]
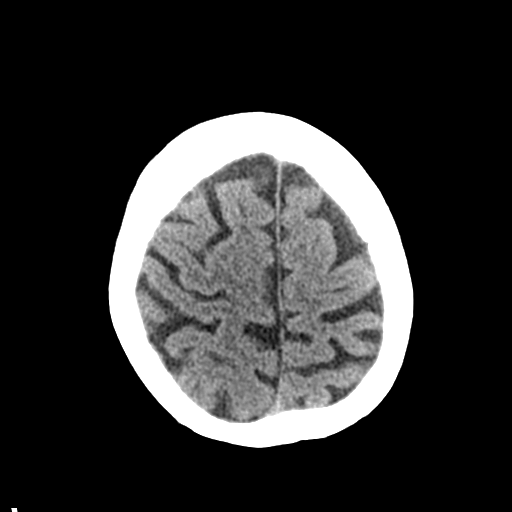
[im 26/32  brain]
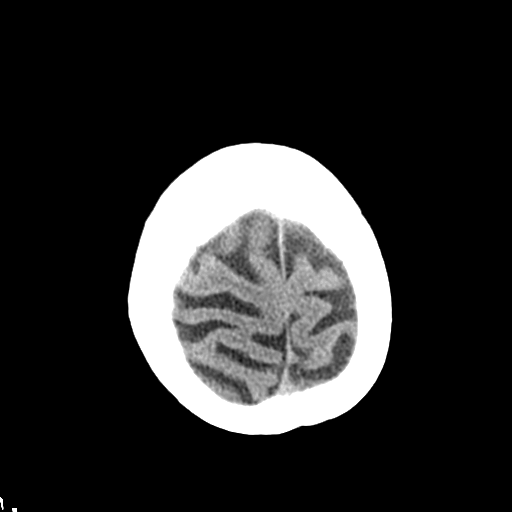
[im 26/32  bone]
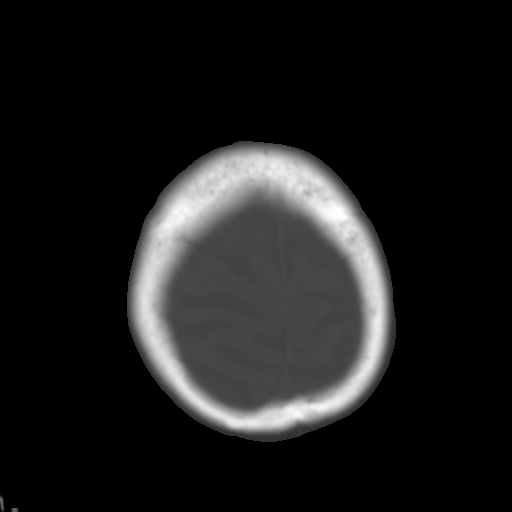
[im 29/32  brain]
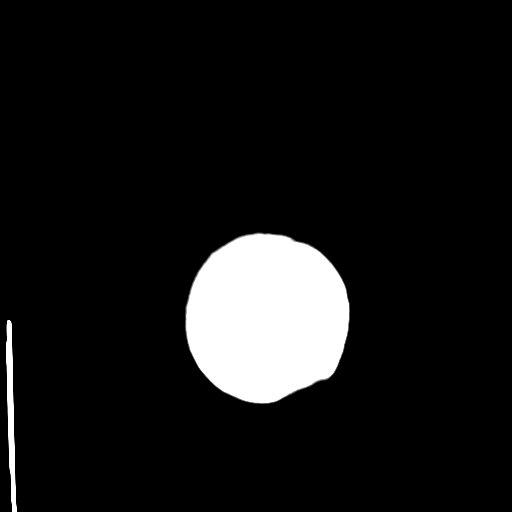

[Series 4: coronal soft · coronal · 0.30mm/px · 3 of 67 slices shown]
[im 23/67  brain]
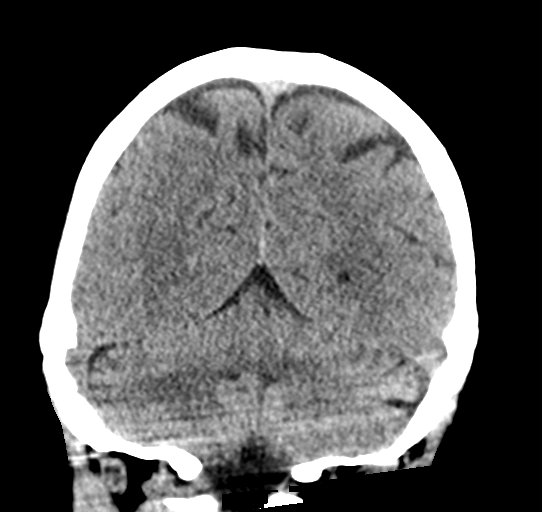
[im 30/67  brain]
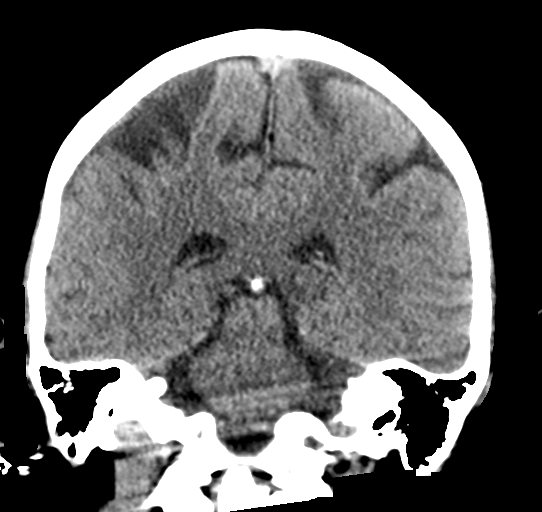
[im 37/67  brain]
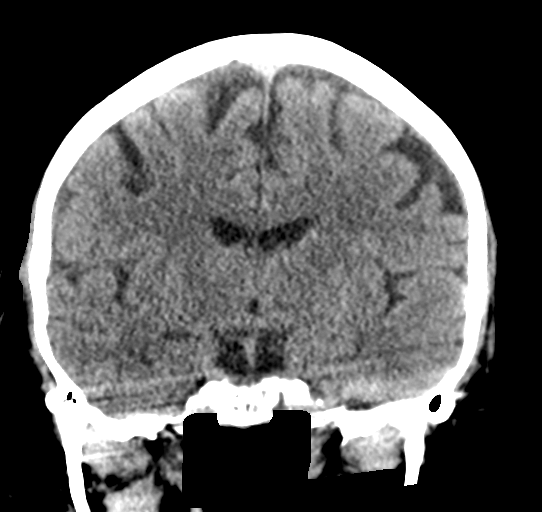

[Series 5: sagittal soft · sagittal · 0.31mm/px · 3 of 56 slices shown]
[im 19/56  brain]
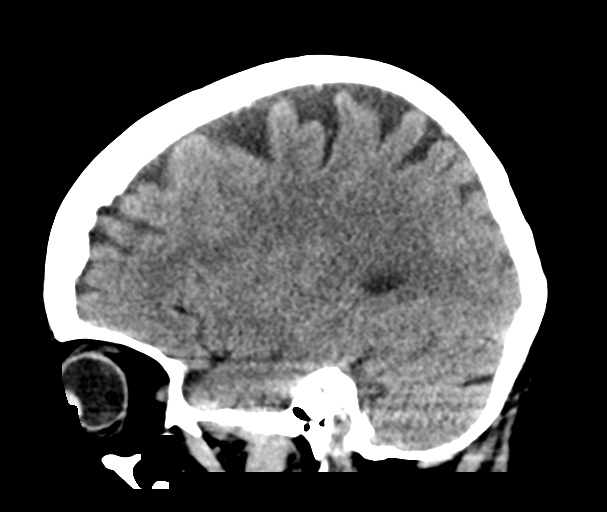
[im 28/56  brain]
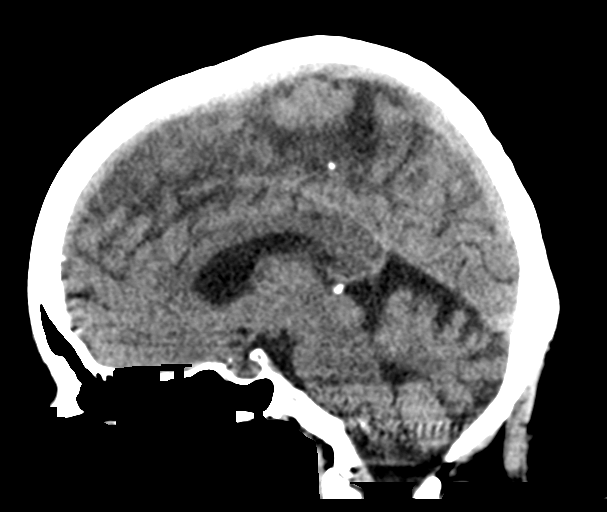
[im 37/56  brain]
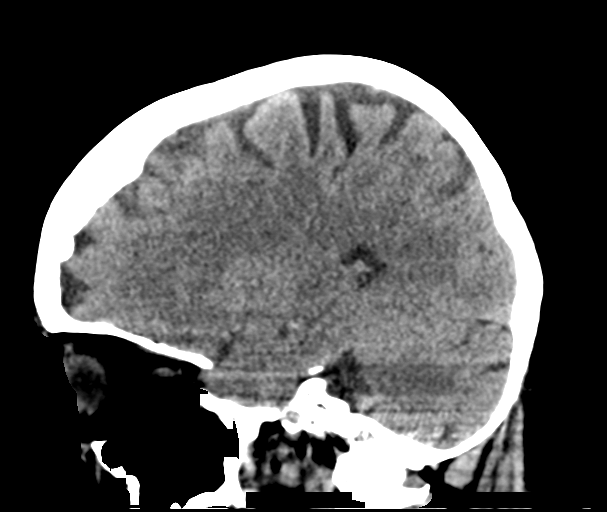

[16 of 47 positions shown; findings below may reference images not displayed]

FINDINGS: Brain: No acute intracranial abnormality. Specifically, no
hemorrhage, hydrocephalus, mass lesion, acute infarction, or
significant intracranial injury. Mild age related volume loss.

Vascular: No hyperdense vessel or unexpected calcification.

Skull: No acute calvarial abnormality.

Sinuses/Orbits: Visualized paranasal sinuses and mastoids clear.
Orbital soft tissues unremarkable.

Other: None
IMPRESSION: No acute intracranial abnormality.

## 2020-08-08 DIAGNOSIS — M25561 Pain in right knee: Secondary | ICD-10-CM | POA: Diagnosis not present

## 2020-08-12 DIAGNOSIS — M25561 Pain in right knee: Secondary | ICD-10-CM | POA: Diagnosis not present

## 2020-08-15 DIAGNOSIS — R300 Dysuria: Secondary | ICD-10-CM | POA: Diagnosis not present

## 2020-08-15 DIAGNOSIS — M25561 Pain in right knee: Secondary | ICD-10-CM | POA: Diagnosis not present

## 2020-08-16 DIAGNOSIS — G4733 Obstructive sleep apnea (adult) (pediatric): Secondary | ICD-10-CM | POA: Diagnosis not present

## 2020-08-19 DIAGNOSIS — R3915 Urgency of urination: Secondary | ICD-10-CM | POA: Diagnosis not present

## 2020-08-19 DIAGNOSIS — M25561 Pain in right knee: Secondary | ICD-10-CM | POA: Diagnosis not present

## 2020-08-19 DIAGNOSIS — R351 Nocturia: Secondary | ICD-10-CM | POA: Diagnosis not present

## 2020-08-19 DIAGNOSIS — N3000 Acute cystitis without hematuria: Secondary | ICD-10-CM | POA: Diagnosis not present

## 2020-09-19 ENCOUNTER — Other Ambulatory Visit (HOSPITAL_COMMUNITY): Payer: Self-pay | Admitting: Orthopedic Surgery

## 2020-09-19 DIAGNOSIS — M545 Low back pain, unspecified: Secondary | ICD-10-CM

## 2020-09-19 DIAGNOSIS — M25562 Pain in left knee: Secondary | ICD-10-CM

## 2020-09-24 ENCOUNTER — Ambulatory Visit (HOSPITAL_COMMUNITY): Payer: Medicare HMO

## 2020-09-26 ENCOUNTER — Other Ambulatory Visit: Payer: Self-pay

## 2020-09-26 ENCOUNTER — Ambulatory Visit (HOSPITAL_COMMUNITY)
Admission: RE | Admit: 2020-09-26 | Discharge: 2020-09-26 | Disposition: A | Discharge status: Home / Self Care | Payer: Medicare HMO | Source: Ambulatory Visit | Attending: Orthopedic Surgery | Admitting: Orthopedic Surgery

## 2020-09-26 DIAGNOSIS — M25562 Pain in left knee: Secondary | ICD-10-CM | POA: Diagnosis present

## 2020-09-26 DIAGNOSIS — M545 Low back pain, unspecified: Secondary | ICD-10-CM | POA: Insufficient documentation

## 2020-10-09 ENCOUNTER — Ambulatory Visit (HOSPITAL_COMMUNITY): Payer: Medicare Other

## 2020-10-14 NOTE — Progress Notes (Signed)
Office Visit Note  Patient: Chloe Golden             Date of Birth: 1946-03-12           MRN: [id number]             PCP: Celene Squibb, MD Referring: Wandra Scot Pru* Visit Date: 10/15/2020   Subjective:  New Patient (Initial Visit) (Patient complains of generalized joint and muscle pain. Patient was taking Plaquenil, but is no longer taking it because she has not been to her previous rheumatologist lately. )   History of Present Illness: Chloe Golden is a 75 y.o. female here for evaluation and recommendations for fibromyalgia.  She has a very long history of joint pain going back to her 20s she is experienced sometimes generalized joint pains and muscle pains.  This has been previously diagnosed as fibromyalgia and she takes medicine for it this as well as for depression and peripheral neuropathy likely related to type 2 diabetes.  She also has significant osteoarthritis of multiple sites including her hands bilateral knees and degenerative disease and/or herniated disc in the lumbar spine.  She had right knee arthroplasty in October 2021 for which she has been doing rehab with pretty good progress now discharged to self-management.  Work-up for the right thumb pain showed large bone spurring at the first [facility] joint she is currently wearing a immobilizing brace with plan to see occupational therapy for a fitted device in the future.  For the low back pain she is seeing an orthopedic specialist with plans for MRI to evaluate disc disease at looks like L5-S1.  She was previously seen by Dr. Scarlette Shorts in [place] and took hydroxychloroquine for about 1 to 2 years for questionable inflammatory arthritis component.  She is off this medicine now not clear that she saw a significant difference in symptoms on or off treatment.  Currently she is having pain pretty much diffusely worst area is in the back between the shoulder blades, the low back going towards the right hip, and in the left knee.    Labs reviewed 05/2020 CBC wnl CMP wnl  Activities of Daily Living:  Patient reports morning stiffness for 24 hours.   Patient Reports nocturnal pain.  Difficulty dressing/grooming: Denies Difficulty climbing stairs: Denies Difficulty getting out of chair: Denies Difficulty using hands for taps, buttons, cutlery, and/or writing: Reports  Review of Systems  Constitutional: Negative for fatigue.  HENT: Positive for mouth dryness. Negative for mouth sores and nose dryness.   Eyes: Positive for dryness. Negative for pain, itching and visual disturbance.  Respiratory: Negative for cough, hemoptysis, shortness of breath and difficulty breathing.   Cardiovascular: Positive for swelling in legs/feet. Negative for chest pain and palpitations.  Gastrointestinal: Positive for constipation and diarrhea. Negative for abdominal pain and blood in stool.  Endocrine: Negative for increased urination.  Genitourinary: Negative for painful urination.  Musculoskeletal: Positive for arthralgias, joint pain, joint swelling, myalgias, muscle weakness, morning stiffness, muscle tenderness and myalgias.  Skin: Negative for color change, rash and redness.  Allergic/Immunologic: Negative for susceptible to infections.  Neurological: Positive for dizziness, numbness, headaches and memory loss. Negative for weakness.  Hematological: Negative for swollen glands.  Psychiatric/Behavioral: Positive for confusion and sleep disturbance.    PMFS History:  Patient Active Problem List   Diagnosis Date Noted  . Pain in right hip 10/15/2020  . Fibromyalgia 10/15/2020  . Osteoarthritis of carpometacarpal joint of right thumb 10/15/2020  . S/P TKR (  total knee replacement) using cement, right 05/14/2020  . Chronic pain of right knee 02/07/2020  . Abdominal bloating 09/28/2012  . GERD (gastroesophageal reflux disease) 09/28/2012  . Esophageal dysphagia 09/28/2012  . Hiatal hernia 09/28/2012  . Diarrhea 09/28/2012   . Weight gain 09/28/2012  . Shoulder pain 07/21/2012  . Neck pain 07/21/2012  . Lumbago 07/21/2012    Past Medical History:  Diagnosis Date  . Acid reflux   . Arthritis   . Degenerative arthritis of cervical spine   . Fibromyalgia   . Functional gastrointestinal disorder   . GERD (gastroesophageal reflux disease)   . Hiatal hernia   . Hypertension   . Neuropathy   . OSA on CPAP   . PAD (peripheral artery disease) (HCC)    Mild by arterial Dopplers April 2019  . Rheumatoid arthritis(714.0)   . Venous reflux     Family History  Problem Relation Age of Onset  . Lung cancer Father   . Diabetes Mellitus II Father   . Crohn's disease Cousin   . Hypertension Mother   . Cystic fibrosis Mother   . Lung cancer Brother   . Hypertension Brother   . Lupus Niece   . Colon cancer Neg Hx   . Celiac disease Neg Hx   . Gastric cancer Neg Hx   . Esophageal cancer Neg Hx    Past Surgical History:  Procedure Laterality Date  . ABDOMINAL HYSTERECTOMY    . BACTERIAL OVERGROWTH TEST N/A 11/09/2012   Procedure: BACTERIAL OVERGROWTH TEST;  Surgeon: Danie Binder, MD;  [facility];  Service: Endoscopy;  Laterality: N/A;  7:30  . BIOPSY N/A 10/28/2012   Procedure: BIOPSY;  Surgeon: Danie Binder, MD;  [facility]: [facility];  Service: Endoscopy;  Laterality: N/A;  DUODENAL BIOPSY  . BLADDER SURGERY    . CARPAL TUNNEL RELEASE    . CHOLECYSTECTOMY    . COLONOSCOPY WITH ESOPHAGOGASTRODUODENOSCOPY (EGD) N/A 10/28/2012   XHB:[id number] diverticulosis in the descending colon and sigmoid colon/Small internal hemorrhoids  . ESOPHAGOGASTRODUODENOSCOPY N/A 11/27/2016   Procedure: ESOPHAGOGASTRODUODENOSCOPY (EGD);  Surgeon: Danie Binder, MD;  [facility];  Service: Endoscopy;  Laterality: N/A;  830   . ESOPHAGOGASTRODUODENOSCOPY (EGD) WITH ESOPHAGEAL DILATION N/A 10/28/2012   FYB:[id number] CERVIAL WEB, S/P DILATION/MILD Non-erosive gastritis & DUODENITIS  . SAVORY DILATION N/A  11/27/2016   Procedure: SAVORY DILATION;  Surgeon: Danie Binder, MD;  [facility];  Service: Endoscopy;  Laterality: N/A;  . TOTAL KNEE ARTHROPLASTY Right    Social History   Social History Narrative   1 living   2 deceased   Immunization History  Administered Date(s) Administered  . Influenza,inj,quad, With Preservative 03/06/2013  . Moderna Sars-Covid-2 Vaccination 07/15/2019, 08/12/2019, 05/21/2020     Objective: Vital Signs: BP (!) 93/55 (BP Location: Right Arm, Patient Position: Sitting, Cuff Size: Normal)   Pulse 78   Ht 5\' 3"  (1.6 m)   Wt 199 lb 12.8 oz (90.6 kg)   BMI 35.39 kg/m    Physical Exam Constitutional:      Appearance: She is obese.  HENT:     Right Ear: External ear normal.     Left Ear: External ear normal.     Mouth/Throat:     Mouth: Mucous membranes are moist.     Pharynx: Oropharynx is clear.  Eyes:     Conjunctiva/sclera: Conjunctivae normal.  Cardiovascular:     Rate and Rhythm: Normal rate and regular rhythm.  Pulmonary:     Effort: Pulmonary effort is normal.     Breath sounds: Normal breath sounds.  Skin:    General: Skin is warm and dry.  Neurological:     General: No focal deficit present.     Mental Status: She is alert.  Psychiatric:        Mood and Affect: Mood normal.    Musculoskeletal Exam:  Neck full ROM tenderness with lateral rotation bilaterally Shoulders full ROM no tenderness or swelling Elbows full ROM no tenderness or swelling Wrists full ROM no swelling tenderness at right first [facility] joint with joint squaring Fingers full ROM no tenderness or swelling Paraspinal muscle tenderness worse between the shoulder blades and just above the iliac crest worse on right side Hip normal internal and external rotation without pain, mild right hip tenderness to palpation and decreased range of motion with pace maneuver Right knee well-healed surgical scar flexion range of motion limited at about 110 to 120 degrees,  left knee patellofemoral crepitus near normal range of motion with joint line tenderness to palpation Ankles full ROM no tenderness or swelling MTPs full ROM no tenderness or swelling   Investigation: No additional findings.  Imaging: MR LUMBAR SPINE WO CONTRAST  Result Date: 09/26/2020 CLINICAL DATA:  Low back pain radiating to the left leg EXAM: MRI LUMBAR SPINE WITHOUT CONTRAST TECHNIQUE: Multiplanar, multisequence MR imaging of the lumbar spine was performed. No intravenous contrast was administered. COMPARISON:  03/10/2018 FINDINGS: Segmentation:  Standard. Alignment: Grade 1 retrolisthesis at L4-5 and grade 1 anterolisthesis at L5-S1 Vertebrae:  Normal Conus medullaris and cauda equina: Conus extends to the L1 level. Conus and cauda equina appear normal. Paraspinal and other soft tissues: Negative Disc levels: L1-L2: Normal disc space and facet joints. No spinal canal stenosis. No neural foraminal stenosis. L2-L3: Normal disc space and facet joints. No spinal canal stenosis. No neural foraminal stenosis. L3-L4: Right asymmetric disc bulge. Right lateral recess narrowing without central spinal canal stenosis. Unchanged mild right neural foraminal stenosis. L4-L5: Right asymmetric disc bulge. Progression of right lateral recess narrowing without central spinal canal stenosis. No neural foraminal stenosis. L5-S1: Unchanged severe facet arthrosis. Disc uncovering. No spinal canal stenosis. Mild bilateral neural foraminal stenosis. Extraforaminal disc component is in relatively close proximity to the exiting left L5 nerve root. Visualized sacrum: Normal. IMPRESSION: 1. Unchanged severe facet arthrosis at L5-S1 with grade 1 anterolisthesis. Extraforaminal disc component is in relatively close proximity to the exiting left L5 nerve root. Mild bilateral L5 neural foraminal stenosis. 2. Progression of right lateral recess narrowing at L4-5. Correlate for right L5 radiculopathy. 3. Unchanged mild right L3-4  neural foraminal stenosis. Electronically Signed   By: Ulyses Jarred M.D.   On: 09/26/2020 21:01    Recent Labs: Lab Results  Component Value Date   WBC 8.1 09/26/2012   HGB 12.7 09/26/2012   PLT 316 09/26/2012   NA 135 09/26/2012   K 3.5 09/26/2012   CL 98 09/26/2012   CO2 25 09/26/2012   GLUCOSE 122 (H) 09/26/2012   BUN 13 09/26/2012   CREATININE 0.84 09/26/2012   BILITOT 0.2 (L) 09/26/2012   ALKPHOS 92 09/26/2012   AST 20 09/26/2012   ALT 15 09/26/2012   PROT 7.1 09/26/2012   ALBUMIN 3.6 09/26/2012   CALCIUM 8.9 09/26/2012   GFRAA 82 (L) 09/26/2012    Speciality Comments: No specialty comments available.  Procedures:  No procedures performed Allergies: Ciprofloxacin, Codeine, Dicyclomine, Ibuprofen, Sulfa antibiotics, and Valium [diazepam]  Assessment / Plan:     Visit Diagnoses: Fibromyalgia  Diffuse body aches, fatigue, depressed mood consistent with history of fibromyalgia syndrome.  Currently generalized symptoms do not seem debilitating but she also has multiple osteoarthritic joint problems.  She is on Lyrica as a longstanding treatment does not tolerated higher doses due to edema. She is also on venlafaxine and uses PRN muscle relaxant medications. Discussed disease is not inflammatory and nonpharmacologic treatments are first line. Recommend review of [facility] [employer] [facility] page for self-management advice.  Pain in right hip  Right hip there is lateral pain and paraspinal muscle tenderness suggesting at least a portion of symptoms are related to musculotendinous problem. Range of movement may be affected secondary to the right knee decreased range of motion and surgery.  Provided instruction on range of motion stretching exercises for this area.   S/P TKR (total knee replacement) using cement, right  Recommend she continue the home exercises as directed by prior physical therapy for knee replacement, range of motion is not normal but definitely  functional.  Osteoarthritis of carpometacarpal (CMC) joint of right thumb, unspecified osteoarthritis type  Currently wearing temporary splint for thumb stabilization she was already referred for OT and custom splinting for [facility] joint arthritis.  Location is atypical for inflammatory change relative to much more likely osteoarthritis.  Orders: No orders of the defined types were placed in this encounter.  No orders of the defined types were placed in this encounter.   Follow-Up Instructions: Return if symptoms worsen or fail to improve.   Collier Salina, MD  Note - This record has been created using [employer].  Chart creation errors have been sought, but may not always  have been located. Such creation errors do not reflect on  the standard of medical care.

## 2020-10-15 ENCOUNTER — Encounter: Payer: Self-pay | Admitting: Internal Medicine

## 2020-10-15 ENCOUNTER — Other Ambulatory Visit: Payer: Self-pay

## 2020-10-15 ENCOUNTER — Ambulatory Visit (INDEPENDENT_AMBULATORY_CARE_PROVIDER_SITE_OTHER): Payer: Medicare HMO | Admitting: Internal Medicine

## 2020-10-15 VITALS — BP 93/55 | HR 78 | Ht 63.0 in | Wt 199.8 lb

## 2020-10-15 DIAGNOSIS — M1811 Unilateral primary osteoarthritis of first carpometacarpal joint, right hand: Secondary | ICD-10-CM | POA: Insufficient documentation

## 2020-10-15 DIAGNOSIS — Z96651 Presence of right artificial knee joint: Secondary | ICD-10-CM

## 2020-10-15 DIAGNOSIS — M797 Fibromyalgia: Secondary | ICD-10-CM | POA: Insufficient documentation

## 2020-10-15 DIAGNOSIS — M25551 Pain in right hip: Secondary | ICD-10-CM | POA: Diagnosis not present

## 2020-10-15 NOTE — Patient Instructions (Signed)
I do not see any current evidence of joint inflammation to need going onto more medicines such as rheumatoid arthritis. There is a lot of existing joint damage with bone spurs and loss of cartilage, which is the process of osteoarthritis.  I recommend checking out the [place] patient-centered guide for fibromyalgia and chronic pain management: https://www.olsen-oconnell.com/   Myofascial Pain Syndrome and Fibromyalgia Myofascial pain syndrome and fibromyalgia are both pain disorders. This pain may be felt mainly in your muscles.  Myofascial pain syndrome: ? Always has tender points in the muscle that will cause pain when pressed (trigger points). The pain may come and go. ? Usually affects your neck, upper back, and shoulder areas. The pain often radiates into your arms and hands.  Fibromyalgia: ? Has muscle pains and tenderness that come and go. ? Is often associated with fatigue and sleep problems. ? Has trigger points. ? Tends to be long-lasting (chronic), but is not life-threatening. Fibromyalgia and myofascial pain syndrome are not the same. However, they often occur together. If you have both conditions, each can make the other worse. Both are common and can cause enough pain and fatigue to make day-to-day activities difficult. Both can be hard to diagnose because their symptoms are common in many other conditions. What are the causes? The exact causes of these conditions are not known. What increases the risk? You are more likely to develop this condition if:  You have a family history of the condition.  You have certain triggers, such as: ? Spine disorders. ? An injury (trauma) or other physical stressors. ? Being under a lot of stress. ? Medical conditions such as osteoarthritis, rheumatoid arthritis, or lupus. What are the signs or symptoms? Fibromyalgia The main symptom of fibromyalgia is widespread pain and tenderness in your muscles. Pain is sometimes described  as stabbing, shooting, or burning. You may also have:  Tingling or numbness.  Sleep problems and fatigue.  Problems with attention and concentration (fibro fog). Other symptoms may include:  Bowel and bladder problems.  Headaches.  Visual problems.  Problems with odors and noises.  Depression or mood changes.  Painful menstrual periods (dysmenorrhea).  Dry skin or eyes. These symptoms can vary over time. Myofascial pain syndrome Symptoms of myofascial pain syndrome include:  Tight, ropy bands of muscle.  Uncomfortable sensations in muscle areas. These may include aching, cramping, burning, numbness, tingling, and weakness.  Difficulty moving certain parts of the body freely (poor range of motion). How is this diagnosed? This condition may be diagnosed by your symptoms and medical history. You will also have a physical exam. In general:  Fibromyalgia is diagnosed if you have pain, fatigue, and other symptoms for more than 3 months, and symptoms cannot be explained by another condition.  Myofascial pain syndrome is diagnosed if you have trigger points in your muscles, and those trigger points are tender and cause pain elsewhere in your body (referred pain). How is this treated? Treatment for these conditions depends on the type that you have.  For fibromyalgia: ? Pain medicines, such as NSAIDs. ? Medicines for treating depression. ? Medicines for treating seizures. ? Medicines that relax the muscles.  For myofascial pain: ? Pain medicines, such as NSAIDs. ? Cooling and stretching of muscles. ? Trigger point injections. ? Sound wave (ultrasound) treatments to stimulate muscles. Treating these conditions often requires a team of health care providers. These may include:  Your primary care provider.  Physical therapist.  Complementary health care providers, such as  massage therapists or acupuncturists.  Psychiatrist for cognitive behavioral therapy.   Follow  these instructions at home: Medicines  Take over-the-counter and prescription medicines only as told by your health care provider.  Do not drive or use heavy machinery while taking prescription pain medicine.  If you are taking prescription pain medicine, take actions to prevent or treat constipation. Your health care provider may recommend that you: ? Drink enough fluid to keep your urine pale yellow. ? Eat foods that are high in fiber, such as fresh fruits and vegetables, whole grains, and beans. ? Limit foods that are high in fat and processed sugars, such as fried or sweet foods. ? Take an over-the-counter or prescription medicine for constipation. Lifestyle  Exercise as directed by your health care provider or physical therapist.  Practice relaxation techniques to control your stress. You may want to try: ? Biofeedback. ? Visual imagery. ? Hypnosis. ? Muscle relaxation. ? Yoga. ? Meditation.  Maintain a healthy lifestyle. This includes eating a healthy diet and getting enough sleep.  Do not use any products that contain nicotine or tobacco, such as cigarettes and e-cigarettes. If you need help quitting, ask your health care provider.   General instructions  Talk to your health care provider about complementary treatments, such as acupuncture or massage.  Consider joining a support group with others who are diagnosed with this condition.  Do not do activities that stress or strain your muscles. This includes repetitive motions and heavy lifting.  Keep all follow-up visits as told by your health care provider. This is important. Where to find more information  National Fibromyalgia Association: www.fmaware.[place]: www.arthritis.org  American Chronic Pain Association: www.theacpa.org Contact a health care provider if:  You have new symptoms.  Your symptoms get worse or your pain is severe.  You have side effects from your medicines.  You have  trouble sleeping.  Your condition is causing depression or anxiety. Summary  Myofascial pain syndrome and fibromyalgia are pain disorders.  Myofascial pain syndrome has tender points in the muscle that will cause pain when pressed (trigger points). Fibromyalgia also has muscle pains and tenderness that come and go, but this condition is often associated with fatigue and sleep disturbances.  Fibromyalgia and myofascial pain syndrome are not the same but often occur together, causing pain and fatigue that make day-to-day activities difficult.  Treatment for fibromyalgia includes taking medicines to relax the muscles and medicines for pain, depression, or seizures. Treatment for myofascial pain syndrome includes taking medicines for pain, cooling and stretching of muscles, and injecting medicines into trigger points.  Follow your health care provider's instructions for taking medicines and maintaining a healthy lifestyle.

## 2020-12-16 DIAGNOSIS — R296 Repeated falls: Secondary | ICD-10-CM | POA: Insufficient documentation

## 2020-12-16 DIAGNOSIS — G629 Polyneuropathy, unspecified: Secondary | ICD-10-CM | POA: Insufficient documentation

## 2020-12-31 DIAGNOSIS — R2689 Other abnormalities of gait and mobility: Secondary | ICD-10-CM | POA: Insufficient documentation

## 2020-12-31 DIAGNOSIS — G3184 Mild cognitive impairment, so stated: Secondary | ICD-10-CM | POA: Diagnosis not present

## 2021-01-02 DIAGNOSIS — Z79899 Other long term (current) drug therapy: Secondary | ICD-10-CM | POA: Diagnosis not present

## 2021-01-03 DIAGNOSIS — M3500 Sicca syndrome, unspecified: Secondary | ICD-10-CM | POA: Diagnosis not present

## 2021-01-03 DIAGNOSIS — Z79899 Other long term (current) drug therapy: Secondary | ICD-10-CM | POA: Diagnosis not present

## 2021-01-06 ENCOUNTER — Ambulatory Visit (INDEPENDENT_AMBULATORY_CARE_PROVIDER_SITE_OTHER): Payer: Medicare HMO | Admitting: Podiatry

## 2021-01-06 ENCOUNTER — Ambulatory Visit (INDEPENDENT_AMBULATORY_CARE_PROVIDER_SITE_OTHER): Payer: Medicare HMO

## 2021-01-06 ENCOUNTER — Other Ambulatory Visit: Payer: Self-pay

## 2021-01-06 DIAGNOSIS — M5416 Radiculopathy, lumbar region: Secondary | ICD-10-CM

## 2021-01-06 DIAGNOSIS — G629 Polyneuropathy, unspecified: Secondary | ICD-10-CM | POA: Diagnosis not present

## 2021-01-06 NOTE — Patient Instructions (Signed)
[facility]. -Recommend Dr. Sherley Bounds

## 2021-01-06 NOTE — Progress Notes (Signed)
   HPI: 75 y.o. female presenting today as a new patient for evaluation of nocturnal pain with cramping and drawing up of her feet bilateral.  This is been ongoing for several years.  Patient states that she was diagnosed with peripheral neuropathy about 12 years ago.  Currently she takes Lyrica and baclofen daily.  Patient also admits to having a history of lumbar sacral pathology.  She sees a pain management/rheumatology doctor and she does receive injections in her lumbar spine occasionally.  She presents for further treatment and evaluation  Past Medical History:  Diagnosis Date   Acid reflux    Arthritis    Degenerative arthritis of cervical spine    Fibromyalgia    Functional gastrointestinal disorder    GERD (gastroesophageal reflux disease)    Hiatal hernia    Hypertension    Neuropathy    OSA on CPAP    PAD (peripheral artery disease) (HCC)    Mild by arterial Dopplers April 2019   Rheumatoid arthritis(714.0)    Venous reflux      Physical Exam: General: The patient is alert and oriented x3 in no acute distress.  Dermatology: Skin is warm, dry and supple bilateral lower extremities. Negative for open lesions or macerations.  Vascular: Palpable pedal pulses bilaterally. No edema or erythema noted. Capillary refill within normal limits.  Neurological: Epicritic and protective threshold diminished bilaterally.   Musculoskeletal Exam: No pedal deformity noted  Radiographic Exam:  Normal osseous mineralization. Joint spaces preserved. No fracture/dislocation/boney destruction.  No significant evidence of advanced arthritic changes  Assessment: 1.  Suspect lumbar radiculopathy; chronic bilateral lower extremities   Plan of Care:  1. Patient evaluated. X-Rays reviewed.  2.  Explained to the patient that I believe the majority the patient's symptoms are coming from her lumbar sacral spine. 3.  Continue Lyrica and baclofen as per prescribing physician 4.  Recommend  follow-up with the neuro spine specialist 5.  Return to clinic as needed      Edrick Kins, DPM [facility]  Dr. Edrick Kins, DPM    [address], [place] [address]                Office [phone]  Fax [phone]

## 2021-01-07 DIAGNOSIS — K219 Gastro-esophageal reflux disease without esophagitis: Secondary | ICD-10-CM | POA: Diagnosis not present

## 2021-01-07 DIAGNOSIS — E1140 Type 2 diabetes mellitus with diabetic neuropathy, unspecified: Secondary | ICD-10-CM | POA: Diagnosis not present

## 2021-01-07 DIAGNOSIS — M79672 Pain in left foot: Secondary | ICD-10-CM | POA: Diagnosis not present

## 2021-01-07 DIAGNOSIS — M1711 Unilateral primary osteoarthritis, right knee: Secondary | ICD-10-CM | POA: Diagnosis not present

## 2021-01-07 DIAGNOSIS — M5136 Other intervertebral disc degeneration, lumbar region: Secondary | ICD-10-CM | POA: Diagnosis not present

## 2021-01-07 DIAGNOSIS — E785 Hyperlipidemia, unspecified: Secondary | ICD-10-CM | POA: Diagnosis not present

## 2021-01-07 DIAGNOSIS — M3500 Sicca syndrome, unspecified: Secondary | ICD-10-CM | POA: Diagnosis not present

## 2021-01-07 DIAGNOSIS — M542 Cervicalgia: Secondary | ICD-10-CM | POA: Diagnosis not present

## 2021-01-07 DIAGNOSIS — M79671 Pain in right foot: Secondary | ICD-10-CM | POA: Diagnosis not present

## 2021-01-07 DIAGNOSIS — I10 Essential (primary) hypertension: Secondary | ICD-10-CM | POA: Diagnosis not present

## 2021-01-13 DIAGNOSIS — M47816 Spondylosis without myelopathy or radiculopathy, lumbar region: Secondary | ICD-10-CM | POA: Diagnosis not present

## 2021-01-13 DIAGNOSIS — I10 Essential (primary) hypertension: Secondary | ICD-10-CM | POA: Diagnosis not present

## 2021-01-13 DIAGNOSIS — M4317 Spondylolisthesis, lumbosacral region: Secondary | ICD-10-CM | POA: Diagnosis not present

## 2021-01-13 DIAGNOSIS — Z6833 Body mass index (BMI) 33.0-33.9, adult: Secondary | ICD-10-CM | POA: Diagnosis not present

## 2021-01-14 DIAGNOSIS — R2689 Other abnormalities of gait and mobility: Secondary | ICD-10-CM | POA: Diagnosis not present

## 2021-01-14 DIAGNOSIS — G3184 Mild cognitive impairment, so stated: Secondary | ICD-10-CM | POA: Diagnosis not present

## 2021-01-16 DIAGNOSIS — H02403 Unspecified ptosis of bilateral eyelids: Secondary | ICD-10-CM | POA: Diagnosis not present

## 2021-01-21 DIAGNOSIS — F5104 Psychophysiologic insomnia: Secondary | ICD-10-CM | POA: Insufficient documentation

## 2021-01-30 DIAGNOSIS — R3911 Hesitancy of micturition: Secondary | ICD-10-CM | POA: Diagnosis not present

## 2021-01-30 DIAGNOSIS — R3914 Feeling of incomplete bladder emptying: Secondary | ICD-10-CM | POA: Diagnosis not present

## 2021-01-30 DIAGNOSIS — R3916 Straining to void: Secondary | ICD-10-CM | POA: Diagnosis not present

## 2021-01-30 DIAGNOSIS — N952 Postmenopausal atrophic vaginitis: Secondary | ICD-10-CM | POA: Diagnosis not present

## 2021-01-30 DIAGNOSIS — R3915 Urgency of urination: Secondary | ICD-10-CM | POA: Diagnosis not present

## 2021-02-03 DIAGNOSIS — M47816 Spondylosis without myelopathy or radiculopathy, lumbar region: Secondary | ICD-10-CM | POA: Diagnosis not present

## 2021-02-11 DIAGNOSIS — H02834 Dermatochalasis of left upper eyelid: Secondary | ICD-10-CM | POA: Diagnosis not present

## 2021-02-11 DIAGNOSIS — H02403 Unspecified ptosis of bilateral eyelids: Secondary | ICD-10-CM | POA: Diagnosis not present

## 2021-02-11 DIAGNOSIS — H02831 Dermatochalasis of right upper eyelid: Secondary | ICD-10-CM | POA: Diagnosis not present

## 2021-02-21 DIAGNOSIS — M180 Bilateral primary osteoarthritis of first carpometacarpal joints: Secondary | ICD-10-CM | POA: Diagnosis not present

## 2021-03-26 DIAGNOSIS — E1165 Type 2 diabetes mellitus with hyperglycemia: Secondary | ICD-10-CM | POA: Insufficient documentation

## 2021-03-27 DIAGNOSIS — E785 Hyperlipidemia, unspecified: Secondary | ICD-10-CM | POA: Diagnosis not present

## 2021-03-27 DIAGNOSIS — R69 Illness, unspecified: Secondary | ICD-10-CM | POA: Diagnosis not present

## 2021-03-27 DIAGNOSIS — E1165 Type 2 diabetes mellitus with hyperglycemia: Secondary | ICD-10-CM | POA: Diagnosis not present

## 2021-04-07 DIAGNOSIS — E782 Mixed hyperlipidemia: Secondary | ICD-10-CM | POA: Insufficient documentation

## 2021-04-07 DIAGNOSIS — K581 Irritable bowel syndrome with constipation: Secondary | ICD-10-CM | POA: Insufficient documentation

## 2021-04-07 DIAGNOSIS — E119 Type 2 diabetes mellitus without complications: Secondary | ICD-10-CM | POA: Insufficient documentation

## 2021-04-07 DIAGNOSIS — I10 Essential (primary) hypertension: Secondary | ICD-10-CM | POA: Insufficient documentation

## 2021-04-07 DIAGNOSIS — F0390 Unspecified dementia without behavioral disturbance: Secondary | ICD-10-CM | POA: Insufficient documentation

## 2021-04-07 DIAGNOSIS — M069 Rheumatoid arthritis, unspecified: Secondary | ICD-10-CM | POA: Insufficient documentation

## 2021-04-07 DIAGNOSIS — F329 Major depressive disorder, single episode, unspecified: Secondary | ICD-10-CM | POA: Insufficient documentation

## 2021-04-08 ENCOUNTER — Other Ambulatory Visit (HOSPITAL_COMMUNITY): Payer: Self-pay | Admitting: Family Medicine

## 2021-04-08 DIAGNOSIS — K219 Gastro-esophageal reflux disease without esophagitis: Secondary | ICD-10-CM | POA: Diagnosis not present

## 2021-04-08 DIAGNOSIS — E1165 Type 2 diabetes mellitus with hyperglycemia: Secondary | ICD-10-CM | POA: Diagnosis not present

## 2021-04-08 DIAGNOSIS — F0390 Unspecified dementia without behavioral disturbance: Secondary | ICD-10-CM | POA: Diagnosis not present

## 2021-04-08 DIAGNOSIS — Z23 Encounter for immunization: Secondary | ICD-10-CM | POA: Diagnosis not present

## 2021-04-08 DIAGNOSIS — N3281 Overactive bladder: Secondary | ICD-10-CM | POA: Diagnosis not present

## 2021-04-08 DIAGNOSIS — Z1231 Encounter for screening mammogram for malignant neoplasm of breast: Secondary | ICD-10-CM

## 2021-04-08 DIAGNOSIS — R69 Illness, unspecified: Secondary | ICD-10-CM | POA: Diagnosis not present

## 2021-04-08 DIAGNOSIS — M797 Fibromyalgia: Secondary | ICD-10-CM | POA: Diagnosis not present

## 2021-04-08 DIAGNOSIS — M069 Rheumatoid arthritis, unspecified: Secondary | ICD-10-CM | POA: Diagnosis not present

## 2021-04-08 DIAGNOSIS — E782 Mixed hyperlipidemia: Secondary | ICD-10-CM | POA: Diagnosis not present

## 2021-04-08 DIAGNOSIS — I10 Essential (primary) hypertension: Secondary | ICD-10-CM | POA: Diagnosis not present

## 2021-04-10 DIAGNOSIS — Z96651 Presence of right artificial knee joint: Secondary | ICD-10-CM | POA: Diagnosis not present

## 2021-04-10 DIAGNOSIS — E119 Type 2 diabetes mellitus without complications: Secondary | ICD-10-CM | POA: Diagnosis not present

## 2021-04-10 DIAGNOSIS — M1712 Unilateral primary osteoarthritis, left knee: Secondary | ICD-10-CM | POA: Diagnosis not present

## 2021-04-22 DIAGNOSIS — G4733 Obstructive sleep apnea (adult) (pediatric): Secondary | ICD-10-CM | POA: Diagnosis not present

## 2021-04-22 DIAGNOSIS — R69 Illness, unspecified: Secondary | ICD-10-CM | POA: Diagnosis not present

## 2021-04-22 DIAGNOSIS — G2581 Restless legs syndrome: Secondary | ICD-10-CM | POA: Diagnosis not present

## 2021-04-22 DIAGNOSIS — F411 Generalized anxiety disorder: Secondary | ICD-10-CM | POA: Insufficient documentation

## 2021-04-24 DIAGNOSIS — H02403 Unspecified ptosis of bilateral eyelids: Secondary | ICD-10-CM | POA: Diagnosis not present

## 2021-05-07 ENCOUNTER — Other Ambulatory Visit: Payer: Self-pay

## 2021-05-07 ENCOUNTER — Ambulatory Visit (HOSPITAL_COMMUNITY)
Admission: RE | Admit: 2021-05-07 | Discharge: 2021-05-07 | Disposition: A | Discharge status: Home / Self Care | Payer: Medicare HMO | Source: Ambulatory Visit | Attending: Family Medicine | Admitting: Family Medicine

## 2021-05-07 DIAGNOSIS — Z1231 Encounter for screening mammogram for malignant neoplasm of breast: Secondary | ICD-10-CM | POA: Diagnosis not present

## 2021-05-08 DIAGNOSIS — G4733 Obstructive sleep apnea (adult) (pediatric): Secondary | ICD-10-CM | POA: Diagnosis not present

## 2021-05-12 DIAGNOSIS — E1142 Type 2 diabetes mellitus with diabetic polyneuropathy: Secondary | ICD-10-CM | POA: Diagnosis not present

## 2021-05-12 DIAGNOSIS — E669 Obesity, unspecified: Secondary | ICD-10-CM | POA: Diagnosis not present

## 2021-05-12 DIAGNOSIS — G2581 Restless legs syndrome: Secondary | ICD-10-CM | POA: Diagnosis not present

## 2021-05-12 DIAGNOSIS — I10 Essential (primary) hypertension: Secondary | ICD-10-CM | POA: Diagnosis not present

## 2021-05-12 DIAGNOSIS — M0550 Rheumatoid polyneuropathy with rheumatoid arthritis of unspecified site: Secondary | ICD-10-CM | POA: Diagnosis not present

## 2021-05-12 DIAGNOSIS — R69 Illness, unspecified: Secondary | ICD-10-CM | POA: Diagnosis not present

## 2021-05-12 DIAGNOSIS — E785 Hyperlipidemia, unspecified: Secondary | ICD-10-CM | POA: Diagnosis not present

## 2021-05-12 DIAGNOSIS — G4700 Insomnia, unspecified: Secondary | ICD-10-CM | POA: Diagnosis not present

## 2021-05-12 DIAGNOSIS — K219 Gastro-esophageal reflux disease without esophagitis: Secondary | ICD-10-CM | POA: Diagnosis not present

## 2021-05-13 ENCOUNTER — Emergency Department (HOSPITAL_COMMUNITY): Payer: Medicare HMO

## 2021-05-13 ENCOUNTER — Encounter (HOSPITAL_COMMUNITY): Payer: Self-pay | Admitting: Radiology

## 2021-05-13 ENCOUNTER — Emergency Department (HOSPITAL_COMMUNITY)
Admission: EM | Admit: 2021-05-13 | Discharge: 2021-05-13 | Disposition: A | Discharge status: Home / Self Care | Payer: Medicare HMO | Attending: Emergency Medicine | Admitting: Emergency Medicine

## 2021-05-13 ENCOUNTER — Other Ambulatory Visit: Payer: Self-pay

## 2021-05-13 ENCOUNTER — Telehealth: Payer: Self-pay | Admitting: Internal Medicine

## 2021-05-13 DIAGNOSIS — I10 Essential (primary) hypertension: Secondary | ICD-10-CM | POA: Insufficient documentation

## 2021-05-13 DIAGNOSIS — Z96651 Presence of right artificial knee joint: Secondary | ICD-10-CM | POA: Insufficient documentation

## 2021-05-13 DIAGNOSIS — R197 Diarrhea, unspecified: Secondary | ICD-10-CM | POA: Diagnosis not present

## 2021-05-13 DIAGNOSIS — R112 Nausea with vomiting, unspecified: Secondary | ICD-10-CM | POA: Insufficient documentation

## 2021-05-13 DIAGNOSIS — R1031 Right lower quadrant pain: Secondary | ICD-10-CM | POA: Diagnosis not present

## 2021-05-13 DIAGNOSIS — R1084 Generalized abdominal pain: Secondary | ICD-10-CM | POA: Insufficient documentation

## 2021-05-13 DIAGNOSIS — Z79899 Other long term (current) drug therapy: Secondary | ICD-10-CM | POA: Insufficient documentation

## 2021-05-13 DIAGNOSIS — R1030 Lower abdominal pain, unspecified: Secondary | ICD-10-CM

## 2021-05-13 DIAGNOSIS — R109 Unspecified abdominal pain: Secondary | ICD-10-CM | POA: Diagnosis not present

## 2021-05-13 DIAGNOSIS — M533 Sacrococcygeal disorders, not elsewhere classified: Secondary | ICD-10-CM | POA: Diagnosis not present

## 2021-05-13 DIAGNOSIS — K219 Gastro-esophageal reflux disease without esophagitis: Secondary | ICD-10-CM | POA: Diagnosis not present

## 2021-05-13 LAB — CBC WITH DIFFERENTIAL/PLATELET
Abs Immature Granulocytes: 0.03 10*3/uL (ref 0.00–0.07)
Basophils Absolute: 0 10*3/uL (ref 0.0–0.1)
Basophils Relative: 1 %
Eosinophils Absolute: 0.1 10*3/uL (ref 0.0–0.5)
Eosinophils Relative: 1 %
HCT: 43.4 % (ref 36.0–46.0)
Hemoglobin: 13.8 g/dL (ref 12.0–15.0)
Immature Granulocytes: 0 %
Lymphocytes Relative: 20 %
Lymphs Abs: 1.5 10*3/uL (ref 0.7–4.0)
MCH: 29.9 pg (ref 26.0–34.0)
MCHC: 31.8 g/dL (ref 30.0–36.0)
MCV: 94.1 fL (ref 80.0–100.0)
Monocytes Absolute: 0.6 10*3/uL (ref 0.1–1.0)
Monocytes Relative: 9 %
Neutro Abs: 5.1 10*3/uL (ref 1.7–7.7)
Neutrophils Relative %: 69 %
Platelets: 309 10*3/uL (ref 150–400)
RBC: 4.61 MIL/uL (ref 3.87–5.11)
RDW: 14 % (ref 11.5–15.5)
WBC: 7.4 10*3/uL (ref 4.0–10.5)
nRBC: 0 % (ref 0.0–0.2)

## 2021-05-13 LAB — BASIC METABOLIC PANEL
Anion gap: 9 (ref 5–15)
BUN: 17 mg/dL (ref 8–23)
CO2: 30 mmol/L (ref 22–32)
Calcium: 9.3 mg/dL (ref 8.9–10.3)
Chloride: 100 mmol/L (ref 98–111)
Creatinine, Ser: 0.98 mg/dL (ref 0.44–1.00)
GFR, Estimated: >60 mL/min (ref >60)
Glucose, Bld: 95 mg/dL (ref 70–99)
Potassium: 3.7 mmol/L (ref 3.5–5.1)
Sodium: 139 mmol/L (ref 135–145)

## 2021-05-13 LAB — LIPASE, BLOOD: Lipase: 29 U/L (ref 11–51)

## 2021-05-13 MED ORDER — ONDANSETRON HCL 4 MG/2ML IJ SOLN
4.0000 mg | Freq: Once | INTRAMUSCULAR | Status: AC
  Administered 2021-05-13: 4 mg via INTRAVENOUS
  Filled 2021-05-13: qty 2

## 2021-05-13 MED ORDER — IOHEXOL 300 MG/ML  SOLN
100.0000 mL | Freq: Once | INTRAMUSCULAR | Status: AC | PRN
  Administered 2021-05-13: 100 mL via INTRAVENOUS

## 2021-05-13 MED ORDER — PANTOPRAZOLE SODIUM 20 MG PO TBEC: 20.0000 mg | DELAYED_RELEASE_TABLET | Freq: Every day | ORAL | 0 refills | Status: DC

## 2021-05-13 MED ORDER — PANTOPRAZOLE SODIUM 40 MG IV SOLR
40.0000 mg | Freq: Once | INTRAVENOUS | Status: AC
  Administered 2021-05-13: 40 mg via INTRAVENOUS
  Filled 2021-05-13: qty 40

## 2021-05-13 MED ORDER — ONDANSETRON HCL 4 MG PO TABS: 4.0000 mg | ORAL_TABLET | Freq: Four times a day (QID) | ORAL | 0 refills | Status: DC

## 2021-05-13 NOTE — ED Notes (Signed)
Pt states she fell few days ago landing on her bottom and c/o pain.

## 2021-05-13 NOTE — ED Notes (Addendum)
VS, protocol lab results, EKG, and CXR reviewed. Pt alert, NAD, calm, interactive, resps e/u, speaking in clear complete sentences.

## 2021-05-13 NOTE — Discharge Instructions (Addendum)
Return to ED with any new or worsening symptoms such as blood in stool, blood in vomit, intractable vomiting, inability to have a bowel movement. Follow-up with GI and call to make an appointment this week.  The number and instructions are attached to this packet. The cause of your abdominal pain is inconclusive at this time.  There does not seem to be an emergent cause of this pain at present time and all of your imaging was negative.   You will need to follow-up with your PCP and GI specialist for further assessment and management. I have prescribed you a proton pump inhibitor.  Take this medication daily until you can see your GI specialist. I have prescribed you a antinausea medication called Zofran.  Utilizes medication whenever you have feelings of nausea in the future.

## 2021-05-13 NOTE — Telephone Encounter (Signed)
PATIENT CALLED STATING SHE WAS VOMITING BILE DAILY AND HAD AWFUL ABDOMINAL PAIN, I TOLD HER HOW FAR OUT WE WERE SCHEDULING AND TOLD HER THAT SHE SHOULD GO TO [facility]

## 2021-05-13 NOTE — ED Notes (Signed)
Up to b/r, steady gait 

## 2021-05-13 NOTE — Telephone Encounter (Signed)
FYI: Dr. Abbey Chatters, This pt was advised by Erline Levine to proceed to the ER to be evaluated. I phoned the pt back and LMOVM encouraging her to do the same thing. Pt hasn't been seen here since 09/09/2017. If she happens to call back I will advise again because I don't know how long she has been vomiting and how severe her abdomen pain is.

## 2021-05-13 NOTE — ED Provider Notes (Signed)
[facility] EMERGENCY DEPARTMENT Provider Note   CSN: [id number] Arrival date & time: 05/13/21  1023     History Chief Complaint  Patient presents with   Abdominal Pain    Chloe Golden is a 75 y.o. female with medical history significant for GERD, DM 2, rheumatoid arthritis, hiatal hernia.  Patient states that for the last 1 month she has had diffuse nonlocalized constant abdominal pain.  She describes the pain as a burning sensation.  She states that she has taken Tums with no relief, and denies any aggravating factors.  Patient states that she will consistently throw up around 10 PM every night.  Patient states that the food is typically somewhat digested. Patient states that her stomach pain is not aggravated or relieved by ingestion of food. Patient endorses nausea, vomiting, diarrhea, flatulence.  Patient continues that her stools will change in consistency between diarrhea and solid. Patient denies blood in stool, blood in vomit, constipation, fever, shortness of breath, chest pain.  Patient also adds that for the last 1 year she has taken 2 Tylenol every day and then a Tylenol PM before bed.   Abdominal Pain Associated symptoms: diarrhea, nausea and vomiting   Associated symptoms: no chest pain, no constipation, no cough, no dysuria, no fever, no shortness of breath, no vaginal bleeding and no vaginal discharge       Past Medical History:  Diagnosis Date   Acid reflux    Arthritis    Degenerative arthritis of cervical spine    Fibromyalgia    Functional gastrointestinal disorder    GERD (gastroesophageal reflux disease)    Hiatal hernia    Hypertension    Neuropathy    OSA on CPAP    PAD (peripheral artery disease) (HCC)    Mild by arterial Dopplers April 2019   Rheumatoid arthritis(714.0)    Venous reflux     Patient Active Problem List   Diagnosis Date Noted   Pain in right hip 10/15/2020   Fibromyalgia 10/15/2020   Osteoarthritis of carpometacarpal joint of  right thumb 10/15/2020   S/P TKR (total knee replacement) using cement, right 05/14/2020   Chronic pain of right knee 02/07/2020   Abdominal bloating 09/28/2012   GERD (gastroesophageal reflux disease) 09/28/2012   Esophageal dysphagia 09/28/2012   Hiatal hernia 09/28/2012   Diarrhea 09/28/2012   Weight gain 09/28/2012   Shoulder pain 07/21/2012   Neck pain 07/21/2012   Lumbago 07/21/2012    Past Surgical History:  Procedure Laterality Date   ABDOMINAL HYSTERECTOMY     BACTERIAL OVERGROWTH TEST N/A 11/09/2012   Procedure: BACTERIAL OVERGROWTH TEST;  Surgeon: Danie Binder, MD;  [facility]: [facility];  Service: Endoscopy;  Laterality: N/A;  7:30   BIOPSY N/A 10/28/2012   Procedure: BIOPSY;  Surgeon: Danie Binder, MD;  Location: [facility];  Service: Endoscopy;  Laterality: N/A;  DUODENAL BIOPSY   BLADDER SURGERY     CARPAL TUNNEL RELEASE     CHOLECYSTECTOMY     COLONOSCOPY WITH ESOPHAGOGASTRODUODENOSCOPY (EGD) N/A 10/28/2012   FWY:OVZCHYIF diverticulosis in the descending colon and sigmoid colon/Small internal hemorrhoids   ESOPHAGOGASTRODUODENOSCOPY N/A 11/27/2016   Procedure: ESOPHAGOGASTRODUODENOSCOPY (EGD);  Surgeon: Danie Binder, MD;  Location: [facility];  Service: Endoscopy;  Laterality: N/A;  830    ESOPHAGOGASTRODUODENOSCOPY (EGD) WITH ESOPHAGEAL DILATION N/A 10/28/2012   OYD:XAJOINOM CERVIAL WEB, S/P DILATION/MILD Non-erosive gastritis & DUODENITIS   SAVORY DILATION N/A 11/27/2016   Procedure: SAVORY DILATION;  Surgeon: Oneida Alar,  Marga Melnick, MD;  Location: [facility];  Service: Endoscopy;  Laterality: N/A;   TOTAL KNEE ARTHROPLASTY Right      OB History   No obstetric history on file.     Family History  Problem Relation Age of Onset   Lung cancer Father    Diabetes Mellitus II Father    Crohn's disease Cousin    Hypertension Mother    Cystic fibrosis Mother    Lung cancer Brother    Hypertension Brother    Lupus Niece    Colon cancer Neg Hx    Celiac  disease Neg Hx    Gastric cancer Neg Hx    Esophageal cancer Neg Hx     Social History   Tobacco Use   Smoking status: Never   Smokeless tobacco: Never  Vaping Use   Vaping Use: Never used  Substance Use Topics   Alcohol use: No   Drug use: No    Home Medications Prior to Admission medications   Medication Sig Start Date End Date Taking? Authorizing Provider  ALPRAZolam Duanne Moron) 0.5 MG tablet Take 0.5 mg by mouth See admin instructions. Take 1 tablet in the morning and 2 tablets every evening   Yes [provider]  amLODipine (NORVASC) 10 MG tablet Take 10 mg by mouth daily. 05/04/20  Yes [provider]  baclofen (LIORESAL) 10 MG tablet Take 10 mg by mouth 2 (two) times daily. 05/04/20  Yes [provider]  cetirizine (ZYRTEC) 10 MG tablet Take 10 mg by mouth daily as needed for allergies.   Yes [provider]  Cholecalciferol (VITAMIN D3) 125 MCG (5000 UT) CAPS Take 1 capsule by mouth daily.   Yes [provider]  Coenzyme Q10 (COQ-10) 100 MG CAPS Take 100 mg by mouth daily. Twice a day on Monday, Wednesday and Friday and 1 capsule all other days.   Yes [provider]  conjugated estrogens (PREMARIN) vaginal cream Place 1 applicator vaginally See admin instructions. Twice a week as directed   Yes [provider]  cyanocobalamin 2000 MCG tablet Take 2,000 mcg by mouth daily.   Yes [provider]  donepezil (ARICEPT) 5 MG tablet Take 10 mg by mouth daily. 07/24/20  Yes [provider]  erythromycin ophthalmic ointment Place 1 application into both eyes 3 (three) times daily. 04/24/21  Yes [provider]  ezetimibe (ZETIA) 10 MG tablet Take 10 mg by mouth at bedtime.  10/20/16  Yes [provider]  furosemide (LASIX) 20 MG tablet Take 20 mg by mouth daily. 05/04/20  Yes [provider]  GEMTESA 75 MG TABS Take 1 tablet by mouth daily. 01/01/21  Yes [provider]   HYDROcodone-acetaminophen (NORCO/VICODIN) 5-325 MG tablet Take 1 tablet by mouth every 6 (six) hours as needed. 05/01/20  Yes [provider]  hydroxychloroquine (PLAQUENIL) 200 MG tablet Take 400 mg by mouth daily. 01/02/21  Yes [provider]  JANUVIA 100 MG tablet Take 100 mg by mouth daily. 05/04/20  Yes [provider]  JARDIANCE 10 MG TABS tablet Take 10 mg by mouth daily. 05/03/21  Yes [provider]  linaclotide (LINZESS) 72 MCG capsule Take 72 mcg by mouth as needed (Constipation).    Yes [provider]  losartan-hydrochlorothiazide (HYZAAR) 100-12.5 MG tablet    Yes [provider]  LYRICA 50 MG capsule Take 1 capsule 2 (two) times daily by mouth. 04/15/17  Yes [provider]  magnesium 30 MG tablet Take  60 mg by mouth 2 (two) times daily.   Yes [provider]  nebivolol (BYSTOLIC) 5 MG tablet Take 5 mg by mouth at bedtime.   Yes [provider]  Omega-3 Fatty Acids (OMEGA 3 500) 500 MG CAPS Take 500 mg by mouth daily.   Yes [provider]  ondansetron (ZOFRAN) 4 MG tablet Take 1 tablet (4 mg total) by mouth every 6 (six) hours. 05/13/21  Yes Genevive Bi F, PA  ondansetron (ZOFRAN-ODT) 4 MG disintegrating tablet Take 4 mg by mouth every 8 (eight) hours as needed. 04/05/20  Yes [provider]  pantoprazole (PROTONIX) 20 MG tablet Take 1 tablet (20 mg total) by mouth daily. 05/13/21  Yes Genevive Bi F, PA  rOPINIRole (REQUIP) 0.5 MG tablet Take 1.5 mg by mouth at bedtime. 05/04/20  Yes [provider]  simvastatin (ZOCOR) 20 MG tablet Take 20 mg by mouth 3 (three) times a week. Monday,Wednesday and Friday 05/03/21  Yes [provider]  traMADol (ULTRAM) 50 MG tablet Take 1 tablet 2 (two) times daily by mouth. 04/16/17  Yes [provider]  traZODone (DESYREL) 100 MG tablet Take 1 tablet at bedtime by mouth. 04/15/17  Yes [provider]   vortioxetine HBr (TRINTELLIX) 10 MG TABS tablet Take 10 mg by mouth daily.   Yes [provider]  zinc gluconate 50 MG tablet Take 50 mg by mouth daily.   Yes [provider]  ACCU-CHEK GUIDE test strip See admin instructions. 12/12/20   [provider]  amoxicillin (AMOXIL) 500 MG capsule Take 500 mg by mouth every 8 (eight) hours. Patient not taking: Reported on 05/13/2021 10/21/20   [provider]  bupivacaine (MARCAINE) 0.25 % injection Inject into the skin. Patient not taking: Reported on 05/13/2021 04/10/21   [provider]  busPIRone (BUSPAR) 10 MG tablet Take 10 mg by mouth 2 (two) times daily.  Patient not taking: Reported on 05/13/2021 04/04/13   [provider]  ciprofloxacin (CIPRO) 500 MG tablet Take 500 mg by mouth daily. Patient not taking: Reported on 05/13/2021 08/19/20   [provider]  dexlansoprazole (DEXILANT) 60 MG capsule Take 60 mg by mouth daily.     [provider]  losartan-hydrochlorothiazide (HYZAAR) 50-12.5 MG per tablet Take 1 tablet by mouth daily.  Patient not taking: Reported on 05/13/2021    [provider]  meclizine (ANTIVERT) 25 MG tablet meclizine 25 mg tablet  Take 1 tablet twice a day by oral route. Patient not taking: Reported on 05/13/2021    [provider]  MYRBETRIQ 50 MG TB24 tablet Take 50 mg by mouth daily. Patient not taking: Reported on 05/13/2021 05/04/20   [provider]  oxybutynin (DITROPAN-XL) 10 MG 24 hr tablet Take 10 mg by mouth daily. Patient not taking: Reported on 05/13/2021 07/17/20   [provider]  pilocarpine (SALAGEN) 5 MG tablet Take 5 mg by mouth 3 (three) times daily as needed. Patient not taking: Reported on 05/13/2021 12/03/20   [provider]  tiZANidine (ZANAFLEX) 4 MG tablet Take by mouth. Patient not taking: Reported on 05/13/2021    [provider]  venlafaxine (EFFEXOR) 37.5 MG tablet Take  37.5 mg by mouth 2 (two) times daily. Patient not taking: Reported on 05/13/2021 10/20/16   [provider]  venlafaxine (EFFEXOR) 75 MG tablet     [provider]    Allergies    Ciprofloxacin, Codeine, Dicyclomine, Ibuprofen, Sulfa antibiotics, and Valium [diazepam]  Review  of Systems   Review of Systems  Constitutional:  Negative for fever.  Respiratory:  Negative for cough, choking and shortness of breath.   Cardiovascular:  Negative for chest pain.  Gastrointestinal:  Positive for abdominal pain, diarrhea, nausea and vomiting. Negative for blood in stool and constipation.  Genitourinary:  Negative for difficulty urinating, dysuria, pelvic pain, vaginal bleeding, vaginal discharge and vaginal pain.  Skin:  Negative for color change.  All other systems reviewed and are negative.  Physical Exam Updated Vital Signs BP (!) 105/51   Pulse 67   Temp 98.1 F (36.7 C) (Oral)   Resp 17   Ht 5\' 3"  (1.6 m)   Wt 89.8 kg   SpO2 96%   BMI 35.07 kg/m   Physical Exam Vitals reviewed.  Constitutional:      General: She is not in acute distress.    Appearance: She is not ill-appearing or toxic-appearing.  HENT:     Head: Normocephalic.  Eyes:     Extraocular Movements: Extraocular movements intact.     Pupils: Pupils are equal, round, and reactive to light.  Cardiovascular:     Rate and Rhythm: Normal rate and regular rhythm.  Pulmonary:     Effort: Pulmonary effort is normal.     Breath sounds: Normal breath sounds. No wheezing.  Abdominal:     General: Abdomen is flat. Bowel sounds are normal. There is no distension.     Palpations: Abdomen is soft.     Tenderness: There is abdominal tenderness in the right lower quadrant and left lower quadrant.  Skin:    General: Skin is warm and dry.     Capillary Refill: Capillary refill takes less than 2 seconds.  Neurological:     General: No focal deficit present.     Mental Status: She is alert.    ED Results /  Procedures / Treatments   Labs (all labs ordered are listed, but only abnormal results are displayed) Labs Reviewed  CBC WITH DIFFERENTIAL/PLATELET  BASIC METABOLIC PANEL  LIPASE, BLOOD  URINALYSIS, ROUTINE W REFLEX MICROSCOPIC    EKG None  Radiology DG Sacrum/Coccyx  Result Date: 05/13/2021 CLINICAL DATA:  Tail bone pain after fall. EXAM: SACRUM AND COCCYX - 2+ VIEW COMPARISON:  None. FINDINGS: There is no evidence of fracture or other focal bone lesions. IMPRESSION: Negative. Electronically Signed   By: Marijo Conception M.D.   On: 05/13/2021 12:56   CT ABDOMEN PELVIS W CONTRAST  Result Date: 05/13/2021 CLINICAL DATA:  Abdominal pain EXAM: CT ABDOMEN AND PELVIS WITH CONTRAST TECHNIQUE: Multidetector CT imaging of the abdomen and pelvis was performed using the standard protocol following bolus administration of intravenous contrast. CONTRAST:  113mL OMNIPAQUE IOHEXOL 300 MG/ML  SOLN COMPARISON:  CT abdomen and pelvis dated January 11, 2005 FINDINGS: Lower chest: No acute abnormality. Hepatobiliary: No focal liver abnormality is seen. Status post cholecystectomy. Dilated common bile duct, unchanged compared to prior exam. Pancreas: Mild fat stranding adjacent to the pancreatic tail. Spleen: Normal in size without focal abnormality. Adrenals/Urinary Tract: Adrenal glands are unremarkable. Kidneys are normal, without renal calculi, focal lesion, or hydronephrosis. Bladder is unremarkable. Stomach/Bowel: Stomach is within normal limits. Appendix appears normal. Diverticulosis. No evidence of bowel wall thickening, distention, or inflammatory changes. Vascular/Lymphatic: Aortic atherosclerosis. No enlarged abdominal or pelvic lymph nodes. Reproductive: No adnexal masses. Other: No abdominal wall hernia or abnormality. No abdominopelvic ascites. Musculoskeletal: No acute or significant osseous findings. IMPRESSION: 1. Mild nonspecific fat stranding adjacent to the  pancreatic tail. Recommend correlation  with serum lipase. Otherwise, no acute findings of the abdomen or pelvis. 2. Diverticulosis with no diverticulitis. 3.  Aortic Atherosclerosis (ICD10-I70.0). Electronically Signed   By: Yetta Glassman M.D.   On: 05/13/2021 16:03    Procedures Procedures   Medications Ordered in ED Medications  ondansetron (ZOFRAN) injection 4 mg (4 mg Intravenous Given 05/13/21 1529)  pantoprazole (PROTONIX) injection 40 mg (40 mg Intravenous Given 05/13/21 1529)  iohexol (OMNIPAQUE) 300 MG/ML solution 100 mL (100 mLs Intravenous Contrast Given 05/13/21 1555)    ED Course  I have reviewed the triage vital signs and the nursing notes.  Pertinent labs & imaging results that were available during my care of the patient were reviewed by me and considered in my medical decision making (see chart for details).    MDM Rules/Calculators/A&P                          75 year old female presents with 1 month of abdominal pain.  On exam, patient pleasant, playing on her phone, nonhypoxic, nontoxic appearing. Patient has history of GERD.  Current differential diagnosis includes gastritis, GERD, large bowel obstruction, DKA, diverticulitis.  Patient states she is stooling normally, so my suspicion for a large bowel obstruction is low.  Patient BMP returns with no elevated blood glucose.  My suspicion for DKA is low.  Labs and imaging studies that will be reviewed and ordered by me include urinalysis, basic metabolic panel, CBC, CT abdomen pelvis with contrast.  CT imaging results and no abnormal findings.  There is mild nonspecific fat stranding of the pancreatic tail, but serum lipase is within normal limits.  CT reveals no evidence of appendicitis, diverticulitis, gastritis. Patient CBC comes back within normal limits. Patient BMP comes back within normal limits. Due to stable CBC, suspicion for a GI bleed is low.  Additionally patient denies any kind of blood in her stool or vomit.  On further questioning  of this patient, she states that she has not been taking her proton pump and have her as directed.  At this time there seems to be no emergent cause to this patient's abdominal pain.  This patient will be discharged with GI follow-up and a prescription for omeprazole.  Strict return precautions been explained to patient and she is understanding and in agreement with return precautions.  Patient is stable on discharge.  Final Clinical Impression(s) / ED Diagnoses Final diagnoses:  Lower abdominal pain    Rx / DC Orders ED Discharge Orders          Ordered    pantoprazole (PROTONIX) 20 MG tablet  Daily        05/13/21 1706    ondansetron (ZOFRAN) 4 MG tablet  Every 6 hours        05/13/21 1706             Azucena Cecil, [place] 05/13/21 1737    Dorie Rank, MD 05/14/21 [phone]

## 2021-05-13 NOTE — ED Triage Notes (Signed)
Pt with abd pain x 2 months.  Hx of acid reflux for x 30 yrs. C/o burning .  + emesis.

## 2021-05-19 DIAGNOSIS — M79641 Pain in right hand: Secondary | ICD-10-CM | POA: Diagnosis not present

## 2021-05-19 DIAGNOSIS — M79671 Pain in right foot: Secondary | ICD-10-CM | POA: Diagnosis not present

## 2021-05-19 DIAGNOSIS — Z79899 Other long term (current) drug therapy: Secondary | ICD-10-CM | POA: Diagnosis not present

## 2021-05-19 DIAGNOSIS — M0579 Rheumatoid arthritis with rheumatoid factor of multiple sites without organ or systems involvement: Secondary | ICD-10-CM | POA: Diagnosis not present

## 2021-05-19 DIAGNOSIS — M79672 Pain in left foot: Secondary | ICD-10-CM | POA: Diagnosis not present

## 2021-05-19 DIAGNOSIS — M797 Fibromyalgia: Secondary | ICD-10-CM | POA: Diagnosis not present

## 2021-05-19 DIAGNOSIS — M549 Dorsalgia, unspecified: Secondary | ICD-10-CM | POA: Diagnosis not present

## 2021-05-19 DIAGNOSIS — M1990 Unspecified osteoarthritis, unspecified site: Secondary | ICD-10-CM | POA: Diagnosis not present

## 2021-05-19 DIAGNOSIS — M79643 Pain in unspecified hand: Secondary | ICD-10-CM | POA: Diagnosis not present

## 2021-05-19 DIAGNOSIS — M79642 Pain in left hand: Secondary | ICD-10-CM | POA: Diagnosis not present

## 2021-05-21 DIAGNOSIS — R69 Illness, unspecified: Secondary | ICD-10-CM | POA: Diagnosis not present

## 2021-05-21 DIAGNOSIS — E1165 Type 2 diabetes mellitus with hyperglycemia: Secondary | ICD-10-CM | POA: Diagnosis not present

## 2021-05-22 ENCOUNTER — Encounter: Payer: Self-pay | Admitting: Internal Medicine

## 2021-05-22 ENCOUNTER — Ambulatory Visit (INDEPENDENT_AMBULATORY_CARE_PROVIDER_SITE_OTHER): Payer: Medicare HMO | Admitting: Internal Medicine

## 2021-05-22 ENCOUNTER — Other Ambulatory Visit: Payer: Self-pay

## 2021-05-22 ENCOUNTER — Encounter: Payer: Self-pay | Admitting: *Deleted

## 2021-05-22 ENCOUNTER — Telehealth: Payer: Self-pay | Admitting: *Deleted

## 2021-05-22 VITALS — BP 114/57 | HR 61 | Temp 96.9°F | Ht 63.0 in | Wt 200.0 lb

## 2021-05-22 DIAGNOSIS — K219 Gastro-esophageal reflux disease without esophagitis: Secondary | ICD-10-CM

## 2021-05-22 DIAGNOSIS — R1319 Other dysphagia: Secondary | ICD-10-CM | POA: Diagnosis not present

## 2021-05-22 DIAGNOSIS — K5900 Constipation, unspecified: Secondary | ICD-10-CM | POA: Diagnosis not present

## 2021-05-22 DIAGNOSIS — R112 Nausea with vomiting, unspecified: Secondary | ICD-10-CM

## 2021-05-22 MED ORDER — ONDANSETRON 4 MG PO TBDP
4.0000 mg | ORAL_TABLET | Freq: Three times a day (TID) | ORAL | 1 refills | Status: AC | PRN
Start: 2021-05-22 — End: 2021-06-21

## 2021-05-22 MED ORDER — PANTOPRAZOLE SODIUM 40 MG PO TBEC: 40.0000 mg | DELAYED_RELEASE_TABLET | Freq: Two times a day (BID) | ORAL | 5 refills | Status: DC

## 2021-05-22 NOTE — Telephone Encounter (Signed)
Called pt and made aware pre-op scheduled for 12/14 at 11:00am. She voiced understanding

## 2021-05-22 NOTE — Patient Instructions (Signed)
We will schedule you for upper endoscopy to further evaluate your heartburn, difficulty swallowing, nausea and vomiting, abdominal pain.  I may perform esophageal dilation depending on findings.  I am going to change your Dexilant to pantoprazole 40 mg twice daily.  This medication works best if you take it 30 minutes before breakfast and 30 minutes before dinner.  I will also refill your Zofran to take as needed for nausea.  Continue on Linzess for constipation.  Further recommendations to follow.  It was very nice meeting you today.  Dr. Abbey Chatters  Lifestyle and home remedies TO MANAGE REFLUX/HEARTBURN    You may eliminate or reduce the frequency of heartburn by making the following lifestyle changes:   Control your weight. Being overweight is a major risk factor for heartburn and GERD. Excess pounds put pressure on your abdomen, pushing up your stomach and causing acid to back up into your esophagus.    Eat smaller meals. 4 TO 6 MEALS A DAY. This reduces pressure on the lower esophageal sphincter, helping to prevent the valve from opening and acid from washing back into your esophagus.     Loosen your belt. Clothes that fit tightly around your waist put pressure on your abdomen and the lower esophageal sphincter.     Eliminate heartburn triggers. Everyone has specific triggers. Common triggers such as fatty or fried foods, spicy food, tomato sauce, carbonated beverages, alcohol, chocolate, mint, garlic, onion, caffeine and nicotine may make heartburn worse.    Avoid stooping or bending. Tying your shoes is OK. Bending over for longer periods to weed your garden isn't, especially soon after eating.    Don't lie down after a meal. Wait at least three to four hours after eating before going to bed, and don't lie down right after eating.    At [facility] we value your feedback. You may receive a survey about your visit today. Please share your experience as we strive to  create trusting relationships with our patients to provide genuine, compassionate, quality care.  We appreciate your understanding and patience as we review any laboratory studies, imaging, and other diagnostic tests that are ordered as we care for you. Our office policy is 5 business days for review of these results, and any emergent or urgent results are addressed in a timely manner for your best interest. If you do not hear from our office in 1 week, please contact us.   We also encourage the use of MyChart, which contains your medical information for your review as well. If you are not enrolled in this feature, an access code is on this after visit summary for your convenience. Thank you for allowing [place] to be involved in your care.  It was great to see you today!  I hope you have a great rest of your Fall!    Elon Alas. Abbey Chatters, D.O. Gastroenterology and Hepatology [facility]

## 2021-05-22 NOTE — Progress Notes (Signed)
Referring Provider: Celene Squibb, MD Primary Care Physician:  Celene Squibb, MD Primary GI:  Dr. Abbey Chatters  Chief Complaint  Patient presents with   vomiting   Dysphagia    Water stops in chest and gurgles then comes back up   Abdominal Pain    Across lower abd    HPI:   Chloe Golden is a 75 y.o. female who presents to clinic today for ER follow-up visit.  Last seen in our clinic greater than 3 years ago.  She states for the past 2 to 3 months she has had worsening epigastric abdominal pain.  Notes severe at times, intermittent, does not radiate.  Also has uncontrolled heartburn and reflux symptoms.  She has been on Dexilant 60 mg daily for many years and feels as though this is not working.  Also with dysphagia to liquids and solids.  Feels as though get stuck in her substernal region.  EGD 2018 showed esophageal stricture status post dilation.  She states this helped with her difficulty swallowing at the time.  Colonoscopy 2014 unremarkable besides diverticulosis with 10-year recall.  Patient denies any melena hematochezia.  No unintentional weight loss.  Does note poor p.o. intake given her upper GI symptoms.  Also has chronic constipation which he takes Linzess 72 mcg 4.  She states as long she takes this medication works so she does not take it every day as she is worried about having episode of incontinence while out of her house.  States she has had to leave church in the past for a change of close.  Past Medical History:  Diagnosis Date   Acid reflux    Arthritis    Degenerative arthritis of cervical spine    Fibromyalgia    Functional gastrointestinal disorder    GERD (gastroesophageal reflux disease)    Hiatal hernia    Hypertension    Neuropathy    OSA on CPAP    PAD (peripheral artery disease) (HCC)    Mild by arterial Dopplers April 2019   Rheumatoid arthritis(714.0)    Venous reflux     Past Surgical History:  Procedure Laterality Date   ABDOMINAL  HYSTERECTOMY     BACTERIAL OVERGROWTH TEST N/A 11/09/2012   Procedure: BACTERIAL OVERGROWTH TEST;  Surgeon: Danie Binder, MD;  Location: [facility];  Service: Endoscopy;  Laterality: N/A;  7:30   BIOPSY N/A 10/28/2012   Procedure: BIOPSY;  Surgeon: Danie Binder, MD;  Location: [facility];  Service: Endoscopy;  Laterality: N/A;  DUODENAL BIOPSY   BLADDER SURGERY     CARPAL TUNNEL RELEASE     CHOLECYSTECTOMY     COLONOSCOPY WITH ESOPHAGOGASTRODUODENOSCOPY (EGD) N/A 10/28/2012   JQZ:[id number] diverticulosis in the descending colon and sigmoid colon/Small internal hemorrhoids   ESOPHAGOGASTRODUODENOSCOPY N/A 11/27/2016   Procedure: ESOPHAGOGASTRODUODENOSCOPY (EGD);  Surgeon: Danie Binder, MD;  Location: [facility];  Service: Endoscopy;  Laterality: N/A;  830    ESOPHAGOGASTRODUODENOSCOPY (EGD) WITH ESOPHAGEAL DILATION N/A 10/28/2012   [id number] CERVIAL WEB, S/P DILATION/MILD Non-erosive gastritis & DUODENITIS   SAVORY DILATION N/A 11/27/2016   Procedure: SAVORY DILATION;  Surgeon: Danie Binder, MD;  Location: [facility];  Service: Endoscopy;  Laterality: N/A;   TOTAL KNEE ARTHROPLASTY Right     Current Outpatient Medications  Medication Sig Dispense Refill   ACCU-CHEK GUIDE test strip See admin instructions.     ALPRAZolam (XANAX) 0.5 MG tablet Take 0.5 mg by mouth See admin instructions.  Take 1 tablet in the morning and 2 tablets every evening     amLODipine (NORVASC) 10 MG tablet Take 10 mg by mouth daily.     baclofen (LIORESAL) 10 MG tablet Take 10 mg by mouth 2 (two) times daily.     cetirizine (ZYRTEC) 10 MG tablet Take 10 mg by mouth daily as needed for allergies.     Cholecalciferol (VITAMIN D3) 125 MCG (5000 UT) CAPS Take 1 capsule by mouth daily.     Coenzyme Q10 (COQ-10) 100 MG CAPS Take 100 mg by mouth daily. Twice a day on Monday, Wednesday and Friday and 1 capsule all other days.     conjugated estrogens (PREMARIN) vaginal cream Place 1 applicator vaginally See  admin instructions. Twice a week as directed     cyanocobalamin 2000 MCG tablet Take 2,000 mcg by mouth daily.     dexlansoprazole (DEXILANT) 60 MG capsule Take 60 mg by mouth daily.      donepezil (ARICEPT) 5 MG tablet Take 10 mg by mouth daily.     ezetimibe (ZETIA) 10 MG tablet Take 10 mg by mouth at bedtime.      furosemide (LASIX) 20 MG tablet Take 20 mg by mouth daily.     GEMTESA 75 MG TABS Take 1 tablet by mouth daily.     HYDROcodone-acetaminophen (NORCO/VICODIN) 5-325 MG tablet Take 1 tablet by mouth every 6 (six) hours as needed.     hydroxychloroquine (PLAQUENIL) 200 MG tablet Take 400 mg by mouth daily.     JANUVIA 100 MG tablet Take 100 mg by mouth daily.     JARDIANCE 10 MG TABS tablet Take 10 mg by mouth daily.     linaclotide (LINZESS) 72 MCG capsule Take 72 mcg by mouth as needed (Constipation).      losartan-hydrochlorothiazide (HYZAAR) 100-12.5 MG tablet Take 1 tablet by mouth daily.     LYRICA 50 MG capsule Take 1 capsule 2 (two) times daily by mouth.     magnesium 30 MG tablet Take 60 mg by mouth 2 (two) times daily.     meclizine (ANTIVERT) 25 MG tablet Take 25 mg by mouth as needed.     nebivolol (BYSTOLIC) 5 MG tablet Take 5 mg by mouth at bedtime.     Omega-3 Fatty Acids (OMEGA 3 500) 500 MG CAPS Take 500 mg by mouth daily.     pilocarpine (SALAGEN) 5 MG tablet Take 5 mg by mouth 3 (three) times daily as needed.     rOPINIRole (REQUIP) 0.5 MG tablet Take 1.5 mg by mouth at bedtime.     simvastatin (ZOCOR) 20 MG tablet Take 20 mg by mouth 3 (three) times a week. Monday,Wednesday and Friday     tiZANidine (ZANAFLEX) 4 MG tablet Take by mouth.     traZODone (DESYREL) 100 MG tablet Take 1 tablet at bedtime by mouth.     vortioxetine HBr (TRINTELLIX) 10 MG TABS tablet Take 10 mg by mouth daily.     zinc gluconate 50 MG tablet Take 50 mg by mouth daily.     amoxicillin (AMOXIL) 500 MG capsule Take 500 mg by mouth every 8 (eight) hours. (Patient not taking: Reported on  05/13/2021)     bupivacaine (MARCAINE) 0.25 % injection Inject into the skin. (Patient not taking: Reported on 05/13/2021)     busPIRone (BUSPAR) 10 MG tablet Take 10 mg by mouth 2 (two) times daily.  (Patient not taking: Reported on 05/13/2021)     ciprofloxacin (CIPRO) 500  MG tablet Take 500 mg by mouth daily. (Patient not taking: Reported on 05/13/2021)     erythromycin ophthalmic ointment Place 1 application into both eyes 3 (three) times daily. (Patient not taking: Reported on 05/22/2021)     losartan-hydrochlorothiazide (HYZAAR) 50-12.5 MG per tablet Take 1 tablet by mouth daily.  (Patient not taking: Reported on 05/13/2021)     MYRBETRIQ 50 MG TB24 tablet Take 50 mg by mouth daily. (Patient not taking: Reported on 05/13/2021)     ondansetron (ZOFRAN) 4 MG tablet Take 1 tablet (4 mg total) by mouth every 6 (six) hours. (Patient not taking: Reported on 05/22/2021) 12 tablet 0   ondansetron (ZOFRAN-ODT) 4 MG disintegrating tablet Take 4 mg by mouth every 8 (eight) hours as needed. (Patient not taking: Reported on 05/22/2021)     oxybutynin (DITROPAN-XL) 10 MG 24 hr tablet Take 10 mg by mouth daily. (Patient not taking: Reported on 05/13/2021)     pantoprazole (PROTONIX) 20 MG tablet Take 1 tablet (20 mg total) by mouth daily. (Patient not taking: Reported on 05/22/2021) 15 tablet 0   traMADol (ULTRAM) 50 MG tablet Take 1 tablet 2 (two) times daily by mouth. (Patient not taking: Reported on 05/22/2021)     venlafaxine (EFFEXOR) 37.5 MG tablet Take 37.5 mg by mouth 2 (two) times daily. (Patient not taking: Reported on 05/22/2021)     venlafaxine (EFFEXOR) 75 MG tablet  (Patient not taking: Reported on 05/13/2021)     No current facility-administered medications for this visit.    Allergies as of 05/22/2021 - Review Complete 05/22/2021  Allergen Reaction Noted   Ciprofloxacin Nausea And Vomiting and Other (See Comments) 09/26/2012   Codeine Other (See Comments) 09/26/2012   Dicyclomine  01/05/2013    Ibuprofen Other (See Comments) 09/26/2012   Sulfa antibiotics  11/24/2016   Valium [diazepam] Other (See Comments) 09/26/2012    Family History  Problem Relation Age of Onset   Lung cancer Father    Diabetes Mellitus II Father    Crohn's disease Cousin    Hypertension Mother    Cystic fibrosis Mother    Lung cancer Brother    Hypertension Brother    Lupus Niece    Colon cancer Neg Hx    Celiac disease Neg Hx    Gastric cancer Neg Hx    Esophageal cancer Neg Hx     Social History   Socioeconomic History   Marital status: Single    Spouse name: Not on file   Number of children: Not on file   Years of education: Not on file   Highest education level: Not on file  Occupational History   Not on file  Tobacco Use   Smoking status: Never   Smokeless tobacco: Never  Vaping Use   Vaping Use: Never used  Substance and Sexual Activity   Alcohol use: No   Drug use: No   Sexual activity: Not on file  Other Topics Concern   Not on file  Social History Narrative   1 living   2 deceased   Social Determinants of Health   Financial Resource Strain: Not on file  Food Insecurity: Not on file  Transportation Needs: Not on file  Physical Activity: Not on file  Stress: Not on file  Social Connections: Not on file    Subjective: Review of Systems  Constitutional:  Negative for chills and fever.  HENT:  Negative for congestion and hearing loss.   Eyes:  Negative for blurred vision and double  vision.  Respiratory:  Negative for cough and shortness of breath.   Cardiovascular:  Negative for chest pain and palpitations.  Gastrointestinal:  Positive for abdominal pain, heartburn, nausea and vomiting. Negative for blood in stool, constipation, diarrhea and melena.  Genitourinary:  Negative for dysuria and urgency.  Musculoskeletal:  Negative for joint pain and myalgias.  Skin:  Negative for itching and rash.  Neurological:  Negative for dizziness and headaches.   Psychiatric/Behavioral:  Negative for depression. The patient is not nervous/anxious.     Objective: BP (!) 114/57   Pulse 61   Temp (!) 96.9 F (36.1 C) (Temporal)   Ht 5\' 3"  (1.6 m)   Wt 200 lb (90.7 kg)   BMI 35.43 kg/m  Physical Exam Constitutional:      Appearance: Normal appearance.  HENT:     Head: Normocephalic and atraumatic.  Eyes:     Extraocular Movements: Extraocular movements intact.     Conjunctiva/sclera: Conjunctivae normal.  Cardiovascular:     Rate and Rhythm: Normal rate and regular rhythm.  Pulmonary:     Effort: Pulmonary effort is normal.     Breath sounds: Normal breath sounds.  Abdominal:     General: Bowel sounds are normal.     Palpations: Abdomen is soft.  Musculoskeletal:        General: No swelling. Normal range of motion.     Cervical back: Normal range of motion and neck supple.  Skin:    General: Skin is warm and dry.     Coloration: Skin is not jaundiced.  Neurological:     General: No focal deficit present.     Mental Status: She is alert and oriented to person, place, and time.  Psychiatric:        Mood and Affect: Mood normal.        Behavior: Behavior normal.     Assessment: *GERD *Dysphagia *Nausea and vomiting  *Abdominal pain  *Constipation  Plan: Will schedule for EGD with possible dilation to evaluate for peptic ulcer disease, esophagitis, gastritis, H. Pylori, duodenitis, or other. Will also evaluate for esophageal stricture, Schatzki's ring, esophageal web or other.   The risks including infection, bleed, or perforation as well as benefits, limitations, alternatives and imponderables have been reviewed with the patient. Potential for esophageal dilation, biopsy, etc. have also been reviewed.  Questions have been answered. All parties agreeable.  I will change patient's Dexilant to pantoprazole 40 mg twice daily.  I have also refilled her Zofran to take as needed for nausea.  Continue on Linzess 72 mcg daily for  constipation.  I counseled her that if she takes this every day she will have an initial washout.  Though hopefully this improves after 3 to 4 days of taking regularly.  She states she will try this.  Further recommendations to follow after endoscopic procedures.   05/22/2021 8:46 AM   Disclaimer: This note was dictated with voice recognition software. Similar sounding words can inadvertently be transcribed and may not be corrected upon review.

## 2021-05-26 DIAGNOSIS — E1151 Type 2 diabetes mellitus with diabetic peripheral angiopathy without gangrene: Secondary | ICD-10-CM | POA: Diagnosis not present

## 2021-05-26 DIAGNOSIS — B351 Tinea unguium: Secondary | ICD-10-CM | POA: Diagnosis not present

## 2021-05-26 DIAGNOSIS — M79674 Pain in right toe(s): Secondary | ICD-10-CM | POA: Diagnosis not present

## 2021-05-26 DIAGNOSIS — M79675 Pain in left toe(s): Secondary | ICD-10-CM | POA: Diagnosis not present

## 2021-06-02 NOTE — Patient Instructions (Signed)
Chloe Golden  06/02/2021     @PREFPERIOPPHARMACY @   Your procedure is scheduled on  06/09/2021.   Report to [facility] at  0730 A.M.   Call this number if you have problems the morning of surgery:  [phone]   Remember:  Follow the diet instructions given to you by the office.    DO NOT take any medications for diabetes the morning of your procedure.    Take these medicines the morning of surgery with A SIP OF WATER         xanax(if needed), baclofen, arivept, gemtesa, lyrica, antivert(if needed), zofran (if needed), protonix.     Do not wear jewelry, make-up or nail polish.  Do not wear lotions, powders, or perfumes, or deodorant.  Do not shave 48 hours prior to surgery.  Men may shave face and neck.  Do not bring valuables to the hospital.  [facility] is not responsible for any belongings or valuables.  Contacts, dentures or bridgework may not be worn into surgery.  Leave your suitcase in the car.  After surgery it may be brought to your room.  For patients admitted to the hospital, discharge time will be determined by your treatment team.  Patients discharged the day of surgery will not be allowed to drive home and must have someone with them for 24 hours.    Special instructions:   DO NOT smoke tobacco or vape for 24 hours before your procedure.  Please read over the following fact sheets that you were given. Anesthesia Post-op Instructions and Care and Recovery After Surgery      Upper Endoscopy, Adult, Care After This sheet gives you information about how to care for yourself after your procedure. Your health care provider may also give you more specific instructions. If you have problems or questions, contact your health care provider. What can I expect after the procedure? After the procedure, it is common to have: A sore throat. Mild stomach pain or discomfort. Bloating. Nausea. Follow these instructions at home:  Follow instructions  from your health care provider about what to eat or drink after your procedure. Return to your normal activities as told by your health care provider. Ask your health care provider what activities are safe for you. Take over-the-counter and prescription medicines only as told by your health care provider. If you were given a sedative during the procedure, it can affect you for several hours. Do not drive or operate machinery until your health care provider says that it is safe. Keep all follow-up visits as told by your health care provider. This is important. Contact a health care provider if you have: A sore throat that lasts longer than one day. Trouble swallowing. Get help right away if: You vomit blood or your vomit looks like coffee grounds. You have: A fever. Bloody, black, or tarry stools. A severe sore throat or you cannot swallow. Difficulty breathing. Severe pain in your chest or abdomen. Summary After the procedure, it is common to have a sore throat, mild stomach discomfort, bloating, and nausea. If you were given a sedative during the procedure, it can affect you for several hours. Do not drive or operate machinery until your health care provider says that it is safe. Follow instructions from your health care provider about what to eat or drink after your procedure. Return to your normal activities as told by your health care provider. This information is not intended to  replace advice given to you by your health care provider. Make sure you discuss any questions you have with your health care provider. Document Revised: 04/14/2019 Document Reviewed: 11/08/2017 Elsevier Patient Education  2022 [employer]. Esophageal Dilatation Esophageal dilatation, also called esophageal dilation, is a procedure to widen or open a blocked or narrowed part of the esophagus. The esophagus is the part of the body that moves food and liquid from the mouth to the stomach. You may need this  procedure if: You have a buildup of scar tissue in your esophagus that makes it difficult, painful, or impossible to swallow. This can be caused by gastroesophageal reflux disease (GERD). You have cancer of the esophagus. There is a problem with how food moves through your esophagus. In some cases, you may need this procedure repeated at a later time to dilate the esophagus gradually. Tell a health care provider about: Any allergies you have. All medicines you are taking, including vitamins, herbs, eye drops, creams, and over-the-counter medicines. Any problems you or family members have had with anesthetic medicines. Any blood disorders you have. Any surgeries you have had. Any medical conditions you have. Any antibiotic medicines you are required to take before dental procedures. Whether you are pregnant or may be pregnant. What are the risks? Generally, this is a safe procedure. However, problems may occur, including: Bleeding due to a tear in the lining of the esophagus. A hole, or perforation, in the esophagus. What happens before the procedure? Ask your health care provider about: Changing or stopping your regular medicines. This is especially important if you are taking diabetes medicines or blood thinners. Taking medicines such as aspirin and ibuprofen. These medicines can thin your blood. Do not take these medicines unless your health care provider tells you to take them. Taking over-the-counter medicines, vitamins, herbs, and supplements. Follow instructions from your health care provider about eating or drinking restrictions. Plan to have a responsible adult take you home from the hospital or clinic. Plan to have a responsible adult care for you for the time you are told after you leave the hospital or clinic. This is important. What happens during the procedure? You may be given a medicine to help you relax (sedative). A numbing medicine may be sprayed into the back of your  throat, or you may gargle the medicine. Your health care provider may perform the dilatation using various surgical instruments, such as: Simple dilators. This instrument is carefully placed in the esophagus to stretch it. Guided wire bougies. This involves using an endoscope to insert a wire into the esophagus. A dilator is passed over this wire to enlarge the esophagus. Then the wire is removed. Balloon dilators. An endoscope with a small balloon is inserted into the esophagus. The balloon is inflated to stretch the esophagus and open it up. The procedure may vary among health care providers and hospitals. What can I expect after the procedure? Your blood pressure, heart rate, breathing rate, and blood oxygen level will be monitored until you leave the hospital or clinic. Your throat may feel slightly sore and numb. This will get better over time. You will not be allowed to eat or drink until your throat is no longer numb. When you are able to drink, urinate, and sit on the edge of the bed without nausea or dizziness, you may be able to return home. Follow these instructions at home: Take over-the-counter and prescription medicines only as told by your health care provider. If you were given  a sedative during the procedure, it can affect you for several hours. Do not drive or operate machinery until your health care provider says that it is safe. Plan to have a responsible adult care for you for the time you are told. This is important. Follow instructions from your health care provider about any eating or drinking restrictions. Do not use any products that contain nicotine or tobacco, such as cigarettes, e-cigarettes, and chewing tobacco. If you need help quitting, ask your health care provider. Keep all follow-up visits. This is important. Contact a health care provider if: You have a fever. You have pain that is not relieved by medicine. Get help right away if: You have chest pain. You  have trouble breathing. You have trouble swallowing. You vomit blood. You have black, tarry, or bloody stools. These symptoms may represent a serious problem that is an emergency. Do not wait to see if the symptoms will go away. Get medical help right away. Call your local emergency services (911 in the [place] Do not drive yourself to the hospital. Summary Esophageal dilatation, also called esophageal dilation, is a procedure to widen or open a blocked or narrowed part of the esophagus. Plan to have a responsible adult take you home from the hospital or clinic. For this procedure, a numbing medicine may be sprayed into the back of your throat, or you may gargle the medicine. Do not drive or operate machinery until your health care provider says that it is safe. This information is not intended to replace advice given to you by your health care provider. Make sure you discuss any questions you have with your health care provider. Document Revised: 10/25/2019 Document Reviewed: 10/25/2019 Elsevier Patient Education  [employer] After This sheet gives you information about how to care for yourself after your procedure. Your health care provider may also give you more specific instructions. If you have problems or questions, contact your health care provider. What can I expect after the procedure? After the procedure, it is common to have: Tiredness. Forgetfulness about what happened after the procedure. Impaired judgment for important decisions. Nausea or vomiting. Some difficulty with balance. Follow these instructions at home: For the time period you were told by your health care provider:   Rest as needed. Do not participate in activities where you could fall or become injured. Do not drive or use machinery. Do not drink alcohol. Do not take sleeping pills or medicines that cause drowsiness. Do not make important decisions or sign legal  documents. Do not take care of children on your own. Eating and drinking Follow the diet that is recommended by your health care provider. Drink enough fluid to keep your urine pale yellow. If you vomit: Drink water, juice, or soup when you can drink without vomiting. Make sure you have little or no nausea before eating solid foods. General instructions Have a responsible adult stay with you for the time you are told. It is important to have someone help care for you until you are awake and alert. Take over-the-counter and prescription medicines only as told by your health care provider. If you have sleep apnea, surgery and certain medicines can increase your risk for breathing problems. Follow instructions from your health care provider about wearing your sleep device: Anytime you are sleeping, including during daytime naps. While taking prescription pain medicines, sleeping medicines, or medicines that make you drowsy. Avoid smoking. Keep all follow-up visits as told by your health  care provider. This is important. Contact a health care provider if: You keep feeling nauseous or you keep vomiting. You feel light-headed. You are still sleepy or having trouble with balance after 24 hours. You develop a rash. You have a fever. You have redness or swelling around the IV site. Get help right away if: You have trouble breathing. You have new-onset confusion at home. Summary For several hours after your procedure, you may feel tired. You may also be forgetful and have poor judgment. Have a responsible adult stay with you for the time you are told. It is important to have someone help care for you until you are awake and alert. Rest as told. Do not drive or operate machinery. Do not drink alcohol or take sleeping pills. Get help right away if you have trouble breathing, or if you suddenly become confused. This information is not intended to replace advice given to you by your health care  provider. Make sure you discuss any questions you have with your health care provider. Document Revised: 02/22/2020 Document Reviewed: 05/11/2019 Elsevier Patient Education  2022 [employer].

## 2021-06-04 ENCOUNTER — Encounter (HOSPITAL_COMMUNITY)
Admission: RE | Admit: 2021-06-04 | Discharge: 2021-06-04 | Disposition: A | Discharge status: Home / Self Care | Payer: Medicare HMO | Source: Ambulatory Visit | Attending: Internal Medicine | Admitting: Internal Medicine

## 2021-06-04 ENCOUNTER — Other Ambulatory Visit: Payer: Self-pay

## 2021-06-04 ENCOUNTER — Encounter (HOSPITAL_COMMUNITY): Payer: Self-pay

## 2021-06-04 DIAGNOSIS — Z01810 Encounter for preprocedural cardiovascular examination: Secondary | ICD-10-CM | POA: Diagnosis not present

## 2021-06-04 HISTORY — DX: Unspecified glaucoma: H40.9

## 2021-06-08 ENCOUNTER — Encounter (HOSPITAL_COMMUNITY): Payer: Self-pay | Admitting: Anesthesiology

## 2021-06-09 ENCOUNTER — Telehealth: Payer: Self-pay | Admitting: Internal Medicine

## 2021-06-09 NOTE — Telephone Encounter (Signed)
LMOVM to call back for pt ?

## 2021-06-09 NOTE — Telephone Encounter (Signed)
Pt has procedure scheduled for today with Dr Abbey Chatters but needs to reschedule due to her husband has covid. [phone]

## 2021-06-10 NOTE — Telephone Encounter (Signed)
LMOVM to call back 

## 2021-06-11 ENCOUNTER — Telehealth: Payer: Self-pay | Admitting: Internal Medicine

## 2021-06-11 NOTE — Telephone Encounter (Signed)
Pt returning call. [phone] or [phone]

## 2021-06-12 ENCOUNTER — Encounter: Payer: Self-pay | Admitting: *Deleted

## 2021-06-12 NOTE — Telephone Encounter (Signed)
Called pt. She tested positive for covid on Monday. I have rescheduled pt to 1/16 at 10:45am. Aware will mail new prep instructions with new pre-op appt.

## 2021-06-18 DIAGNOSIS — M1990 Unspecified osteoarthritis, unspecified site: Secondary | ICD-10-CM | POA: Diagnosis not present

## 2021-06-18 DIAGNOSIS — Z79899 Other long term (current) drug therapy: Secondary | ICD-10-CM | POA: Diagnosis not present

## 2021-06-18 DIAGNOSIS — M797 Fibromyalgia: Secondary | ICD-10-CM | POA: Diagnosis not present

## 2021-06-18 DIAGNOSIS — R059 Cough, unspecified: Secondary | ICD-10-CM | POA: Diagnosis not present

## 2021-06-18 DIAGNOSIS — K219 Gastro-esophageal reflux disease without esophagitis: Secondary | ICD-10-CM | POA: Diagnosis not present

## 2021-06-18 DIAGNOSIS — U071 COVID-19: Secondary | ICD-10-CM | POA: Diagnosis not present

## 2021-06-18 DIAGNOSIS — M0609 Rheumatoid arthritis without rheumatoid factor, multiple sites: Secondary | ICD-10-CM | POA: Diagnosis not present

## 2021-06-25 DIAGNOSIS — Z0120 Encounter for dental examination and cleaning without abnormal findings: Secondary | ICD-10-CM | POA: Diagnosis not present

## 2021-07-02 ENCOUNTER — Other Ambulatory Visit: Payer: Self-pay

## 2021-07-02 ENCOUNTER — Ambulatory Visit (INDEPENDENT_AMBULATORY_CARE_PROVIDER_SITE_OTHER): Payer: Medicare HMO | Admitting: Urology

## 2021-07-02 ENCOUNTER — Encounter: Payer: Self-pay | Admitting: Urology

## 2021-07-02 VITALS — BP 114/53 | HR 72 | Ht 64.0 in | Wt 194.0 lb

## 2021-07-02 DIAGNOSIS — N3281 Overactive bladder: Secondary | ICD-10-CM | POA: Diagnosis not present

## 2021-07-02 DIAGNOSIS — N3941 Urge incontinence: Secondary | ICD-10-CM

## 2021-07-02 DIAGNOSIS — Z87440 Personal history of urinary (tract) infections: Secondary | ICD-10-CM | POA: Diagnosis not present

## 2021-07-02 NOTE — Progress Notes (Signed)
Urological Symptom Review  Patient is experiencing the following symptoms: Hard to postpone urination Burning/pain with urination Get up at night to urinate Leakage of urine Stream starts and stops Trouble starting stream Have to strain to urinate Urinary tract infection Weak stream   Review of Systems  Gastrointestinal (upper)  : Nausea Indigestion/heartburn  Gastrointestinal (lower) : Diarrhea Constipation  Constitutional : Fatigue  Skin: Itching  Eyes: Blurred vision  Ear/Nose/Throat : Negative for Ear/Nose/Throat symptoms  Hematologic/Lymphatic: Easy bruising  Cardiovascular : Leg swelling Chest pain  Respiratory : Negative for respiratory symptoms  Endocrine: Negative for endocrine symptoms  Musculoskeletal: Back pain Joint pain  Neurological: Negative for neurological symptoms  Psychologic: Depression Anxiety

## 2021-07-02 NOTE — Progress Notes (Signed)
Assessment: 1. OAB (overactive bladder)   2. Urge incontinence   3. History of UTI      Plan: Given her complex urologic history, I would like to review her office notes from Dr. Titus Mould regarding her prior evaluation and management. Request all records from Dr. Titus Mould for review Continue Gemtesa at the present time Schedule patient for cystoscopy in approximately 3-4 weeks.  Chief Complaint:  Chief Complaint  Patient presents with   Over Active Bladder    History of Present Illness:  Chloe Golden is a 76 y.o. year old female who is seen in consultation from Celene Squibb, MD for evaluation of overactive bladder symptoms and urge incontinence.  She has had urinary symptoms for at least 20 years.  She has been followed by Dr. Titus Mould in [place].  She was previously managed with Myrbetriq and was changed to Peter approximately 6 months ago.  She continues to have symptoms of urgency, urge incontinence, primarily at night, hesitancy, intermittent stream, stranguria, and sensation of incomplete emptying.  She uses pads on occasion.  Further evaluation with cystoscopy and urodynamics was recommended by Dr. Titus Mould.  She has not proceeded with this evaluation due to a change in her insurance.  She also reports a history of recurrent UTIs for the past 10 years.  She has been treated with multiple antibiotics.  She currently has a prescription for antibiotics for as needed use.  She last took antibiotics approximately 3 weeks ago.  No culture results available for review.   Past Medical History:  Past Medical History:  Diagnosis Date   Acid reflux    Arthritis    Degenerative arthritis of cervical spine    Fibromyalgia    Functional gastrointestinal disorder    GERD (gastroesophageal reflux disease)    Glaucoma    Hiatal hernia    Hypertension    Neuropathy    OSA on CPAP    PAD (peripheral artery disease) (HCC)    Mild by arterial Dopplers April 2019   Rheumatoid  arthritis(714.0)    Venous reflux     Past Surgical History:  Past Surgical History:  Procedure Laterality Date   ABDOMINAL HYSTERECTOMY     BACTERIAL OVERGROWTH TEST N/A 11/09/2012   Procedure: BACTERIAL OVERGROWTH TEST;  Surgeon: Danie Binder, MD;  [facility];  Service: Endoscopy;  Laterality: N/A;  7:30   BIOPSY N/A 10/28/2012   Procedure: BIOPSY;  Surgeon: Danie Binder, MD;  [facility];  Service: Endoscopy;  Laterality: N/A;  DUODENAL BIOPSY   BLADDER SURGERY     CARPAL TUNNEL RELEASE Bilateral    CHOLECYSTECTOMY     COLONOSCOPY WITH ESOPHAGOGASTRODUODENOSCOPY (EGD) N/A 10/28/2012   HEN:IDPOEUMP diverticulosis in the descending colon and sigmoid colon/Small internal hemorrhoids   ESOPHAGOGASTRODUODENOSCOPY N/A 11/27/2016   Procedure: ESOPHAGOGASTRODUODENOSCOPY (EGD);  Surgeon: Danie Binder, MD;  [facility];  Service: Endoscopy;  Laterality: N/A;  830    ESOPHAGOGASTRODUODENOSCOPY (EGD) WITH ESOPHAGEAL DILATION N/A 10/28/2012   NTI:[id number] CERVIAL WEB, S/P DILATION/MILD Non-erosive gastritis & DUODENITIS   SAVORY DILATION N/A 11/27/2016   Procedure: SAVORY DILATION;  Surgeon: Danie Binder, MD;  [facility];  Service: Endoscopy;  Laterality: N/A;   TOTAL KNEE ARTHROPLASTY Right     Allergies:  Allergies  Allergen Reactions   Codeine Other (See Comments)    Constipation   Dicyclomine     TURNED HER SKIN BLUE   Ibuprofen Other (See Comments)  Ulcers    Sulfa Antibiotics     Yeast infections    Valium [Diazepam] Other (See Comments)    Alters mental status    Family History:  Family History  Problem Relation Age of Onset   Lung cancer Father    Diabetes Mellitus II Father    Crohn's disease Cousin    Hypertension Mother    Cystic fibrosis Mother    Lung cancer Brother    Hypertension Brother    Lupus Niece    Colon cancer Neg Hx    Celiac disease Neg Hx    Gastric cancer Neg Hx    Esophageal cancer  Neg Hx     Social History:  Social History   Tobacco Use   Smoking status: Never   Smokeless tobacco: Never  Vaping Use   Vaping Use: Never used  Substance Use Topics   Alcohol use: No   Drug use: No    Review of symptoms:  Constitutional:  Negative for unexplained weight loss, night sweats, fever, chills ENT:  Negative for nose bleeds, sinus pain, painful swallowing CV:  Negative for chest pain, shortness of breath, exercise intolerance, palpitations, loss of consciousness Resp:  Negative for cough, wheezing, shortness of breath GI:  Negative for nausea, vomiting, diarrhea, bloody stools GU:  Positives noted in HPI; otherwise negative for gross hematuria, dysuria Neuro:  Negative for seizures, poor balance, limb weakness, slurred speech Psych:  Negative for lack of energy, depression, anxiety Endocrine:  Negative for polydipsia, polyuria, symptoms of hypoglycemia (dizziness, hunger, sweating) Hematologic:  Negative for anemia, purpura, petechia, prolonged or excessive bleeding, use of anticoagulants  Allergic:  Negative for difficulty breathing or choking as a result of exposure to anything; no shellfish allergy; no allergic response (rash/itch) to materials, foods  Physical exam: BP (!) 114/53    Pulse 72    Ht 5\' 4"  (1.626 m)    Wt 194 lb (88 kg)    BMI 33.30 kg/m  GENERAL APPEARANCE:  Well appearing, well developed, well nourished, NAD HEENT: Atraumatic, Normocephalic, oropharynx clear. NECK: Supple without lymphadenopathy or thyromegaly. LUNGS: Clear to auscultation bilaterally. HEART: Regular Rate and Rhythm without murmurs, gallops, or rubs. ABDOMEN: Soft, non-tender, No Masses. EXTREMITIES: Moves all extremities well.  Without clubbing, cyanosis, or edema. NEUROLOGIC:  Alert and oriented x 3, normal gait, CN II-XII grossly intact.  MENTAL STATUS:  Appropriate. BACK:  Non-tender to palpation.  No CVAT SKIN:  Warm, dry and intact.    Results: U/A dipstick  negative  PVR = 37 ml

## 2021-07-03 ENCOUNTER — Encounter (HOSPITAL_COMMUNITY): Payer: Self-pay

## 2021-07-03 ENCOUNTER — Other Ambulatory Visit: Payer: Self-pay

## 2021-07-03 ENCOUNTER — Encounter (HOSPITAL_COMMUNITY)
Admission: RE | Admit: 2021-07-03 | Discharge: 2021-07-03 | Disposition: A | Discharge status: Home / Self Care | Payer: Medicare HMO | Source: Ambulatory Visit | Attending: Internal Medicine | Admitting: Internal Medicine

## 2021-07-03 LAB — URINALYSIS, ROUTINE W REFLEX MICROSCOPIC
Bilirubin, UA: NEGATIVE
Ketones, UA: NEGATIVE
Leukocytes,UA: NEGATIVE
Nitrite, UA: NEGATIVE
Protein,UA: NEGATIVE
RBC, UA: NEGATIVE
Specific Gravity, UA: 1.015 (ref 1.005–1.030)
Urobilinogen, Ur: 0.2 mg/dL (ref 0.2–1.0)
pH, UA: 6 (ref 5.0–7.5)

## 2021-07-07 ENCOUNTER — Ambulatory Visit (HOSPITAL_COMMUNITY)
Admission: RE | Admit: 2021-07-07 | Discharge: 2021-07-07 | Disposition: A | Discharge status: Home / Self Care | Payer: Medicare HMO | Attending: Internal Medicine | Admitting: Internal Medicine

## 2021-07-07 ENCOUNTER — Encounter (HOSPITAL_COMMUNITY)
Admission: RE | Disposition: A | Discharge status: Home / Self Care | Payer: Self-pay | Source: Home / Self Care | Attending: Internal Medicine

## 2021-07-07 ENCOUNTER — Encounter (HOSPITAL_COMMUNITY): Payer: Self-pay

## 2021-07-07 ENCOUNTER — Ambulatory Visit (HOSPITAL_COMMUNITY): Payer: Medicare HMO | Admitting: Anesthesiology

## 2021-07-07 DIAGNOSIS — R1310 Dysphagia, unspecified: Secondary | ICD-10-CM | POA: Insufficient documentation

## 2021-07-07 DIAGNOSIS — I10 Essential (primary) hypertension: Secondary | ICD-10-CM | POA: Diagnosis not present

## 2021-07-07 DIAGNOSIS — G4733 Obstructive sleep apnea (adult) (pediatric): Secondary | ICD-10-CM | POA: Diagnosis not present

## 2021-07-07 DIAGNOSIS — M797 Fibromyalgia: Secondary | ICD-10-CM | POA: Diagnosis not present

## 2021-07-07 DIAGNOSIS — K219 Gastro-esophageal reflux disease without esophagitis: Secondary | ICD-10-CM | POA: Diagnosis not present

## 2021-07-07 DIAGNOSIS — K2970 Gastritis, unspecified, without bleeding: Secondary | ICD-10-CM | POA: Diagnosis not present

## 2021-07-07 DIAGNOSIS — I739 Peripheral vascular disease, unspecified: Secondary | ICD-10-CM | POA: Insufficient documentation

## 2021-07-07 DIAGNOSIS — K3189 Other diseases of stomach and duodenum: Secondary | ICD-10-CM | POA: Diagnosis not present

## 2021-07-07 DIAGNOSIS — G4730 Sleep apnea, unspecified: Secondary | ICD-10-CM | POA: Diagnosis not present

## 2021-07-07 DIAGNOSIS — R12 Heartburn: Secondary | ICD-10-CM | POA: Diagnosis present

## 2021-07-07 HISTORY — PX: BIOPSY: SHX5522

## 2021-07-07 HISTORY — PX: BALLOON DILATION: SHX5330

## 2021-07-07 HISTORY — PX: ESOPHAGOGASTRODUODENOSCOPY (EGD) WITH PROPOFOL: SHX5813

## 2021-07-07 SURGERY — ESOPHAGOGASTRODUODENOSCOPY (EGD) WITH PROPOFOL
Anesthesia: General

## 2021-07-07 MED ORDER — LIDOCAINE HCL (CARDIAC) PF 100 MG/5ML IV SOSY
PREFILLED_SYRINGE | INTRAVENOUS | Status: DC | PRN
  Administered 2021-07-07: 50 mg via INTRAVENOUS

## 2021-07-07 MED ORDER — PHENYLEPHRINE 40 MCG/ML (10ML) SYRINGE FOR IV PUSH (FOR BLOOD PRESSURE SUPPORT)
PREFILLED_SYRINGE | INTRAVENOUS | Status: DC | PRN
  Administered 2021-07-07: 80 ug via INTRAVENOUS
  Administered 2021-07-07: 40 ug via INTRAVENOUS

## 2021-07-07 MED ORDER — PROPOFOL 10 MG/ML IV BOLUS
INTRAVENOUS | Status: DC | PRN
Start: 2021-07-07 — End: 2021-07-07
  Administered 2021-07-07: 30 mg via INTRAVENOUS
  Administered 2021-07-07: 100 mg via INTRAVENOUS

## 2021-07-07 MED ORDER — LACTATED RINGERS IV SOLN
INTRAVENOUS | Status: DC
  Administered 2021-07-07: 1000 mL via INTRAVENOUS

## 2021-07-07 NOTE — Anesthesia Preprocedure Evaluation (Addendum)
Anesthesia Evaluation  Patient identified by MRN, date of birth, ID band Patient awake    Reviewed: Allergy & Precautions, NPO status , Patient's Chart, lab work & pertinent test results  Airway Mallampati: III       Dental no notable dental hx. (+) Teeth Intact, Dental Advisory Given   Pulmonary sleep apnea ,    Pulmonary exam normal        Cardiovascular hypertension, + Peripheral Vascular Disease  Normal cardiovascular exam     Neuro/Psych  Neuromuscular disease    GI/Hepatic Neg liver ROS, hiatal hernia, GERD  ,  Endo/Other  negative endocrine ROS  Renal/GU negative Renal ROS     Musculoskeletal  (+) Arthritis , Fibromyalgia -  Abdominal (+) + obese,   Peds  Hematology negative hematology ROS (+)   Anesthesia Other Findings   Reproductive/Obstetrics                            Anesthesia Physical Anesthesia Plan  ASA: 2  Anesthesia Plan: General   Post-op Pain Management:    Induction: Intravenous  PONV Risk Score and Plan: TIVA  Airway Management Planned: Nasal Cannula  Additional Equipment:   Intra-op Plan:   Post-operative Plan:   Informed Consent: I have reviewed the patients History and Physical, chart, labs and discussed the procedure including the risks, benefits and alternatives for the proposed anesthesia with the patient or authorized representative who has indicated his/her understanding and acceptance.       Plan Discussed with: CRNA  Anesthesia Plan Comments:         Anesthesia Quick Evaluation

## 2021-07-07 NOTE — H&P (Signed)
Primary Care Physician:  Celene Squibb, MD Primary Gastroenterologist:  Dr. Abbey Chatters  Pre-Procedure History & Physical: HPI:  Chloe Golden is a 76 y.o. female is here for an EGD with possible dilation for GERD/Dysphagia.   Past Medical History:  Diagnosis Date   Acid reflux    Arthritis    Degenerative arthritis of cervical spine    Fibromyalgia    Functional gastrointestinal disorder    GERD (gastroesophageal reflux disease)    Glaucoma    Hiatal hernia    Hypertension    Neuropathy    OSA on CPAP    PAD (peripheral artery disease) (HCC)    Mild by arterial Dopplers April 2019   Rheumatoid arthritis(714.0)    Venous reflux     Past Surgical History:  Procedure Laterality Date   ABDOMINAL HYSTERECTOMY     BACTERIAL OVERGROWTH TEST N/A 11/09/2012   Procedure: BACTERIAL OVERGROWTH TEST;  Surgeon: Danie Binder, MD;  [facility];  Service: Endoscopy;  Laterality: N/A;  7:30   BIOPSY N/A 10/28/2012   Procedure: BIOPSY;  Surgeon: Danie Binder, MD;  [facility];  Service: Endoscopy;  Laterality: N/A;  DUODENAL BIOPSY   BLADDER SURGERY     CARPAL TUNNEL RELEASE Bilateral    CHOLECYSTECTOMY     COLONOSCOPY WITH ESOPHAGOGASTRODUODENOSCOPY (EGD) N/A 10/28/2012   YCX:KGYJEHUD diverticulosis in the descending colon and sigmoid colon/Small internal hemorrhoids   ESOPHAGOGASTRODUODENOSCOPY N/A 11/27/2016   Procedure: ESOPHAGOGASTRODUODENOSCOPY (EGD);  Surgeon: Danie Binder, MD;  [facility];  Service: Endoscopy;  Laterality: N/A;  830    ESOPHAGOGASTRODUODENOSCOPY (EGD) WITH ESOPHAGEAL DILATION N/A 10/28/2012   JSH:[id number] CERVIAL WEB, S/P DILATION/MILD Non-erosive gastritis & DUODENITIS   SAVORY DILATION N/A 11/27/2016   Procedure: SAVORY DILATION;  Surgeon: Danie Binder, MD;  [facility];  Service: Endoscopy;  Laterality: N/A;   TOTAL KNEE ARTHROPLASTY Right     Prior to Admission medications   Medication Sig Start Date End  Date Taking? Authorizing Provider  ALPRAZolam Duanne Moron) 0.5 MG tablet Take 0.5-1 mg by mouth See admin instructions. 0.5 mg in the morning, 1 mg at bedtime   Yes [provider]  baclofen (LIORESAL) 10 MG tablet Take 10 mg by mouth 2 (two) times daily. 05/04/20  Yes [provider]  ciprofloxacin (CIPRO) 500 MG tablet Take 500 mg by mouth daily. 08/19/20  Yes [provider]  Coenzyme Q10 (COQ-10) 100 MG CAPS Take 100 mg by mouth See admin instructions. Twice a day on Monday, Wednesday and Friday and 1 capsule all other days.   Yes [provider]  conjugated estrogens (PREMARIN) vaginal cream Place 1 applicator vaginally See admin instructions. Twice a week as directed   Yes [provider]  donepezil (ARICEPT) 10 MG tablet Take 10 mg by mouth daily. 07/24/20  Yes [provider]  ezetimibe (ZETIA) 10 MG tablet Take 10 mg by mouth at bedtime.  10/20/16  Yes [provider]  furosemide (LASIX) 20 MG tablet Take 20 mg by mouth daily. 05/04/20  Yes [provider]  GEMTESA 75 MG TABS Take 75 mg by mouth daily. 01/01/21  Yes [provider]  hydroxychloroquine (PLAQUENIL) 200 MG tablet Take 400 mg by mouth daily. 01/02/21  Yes [provider]  JANUVIA 100 MG tablet Take 100 mg by mouth daily. 05/04/20  Yes [provider]  JARDIANCE 10 MG TABS tablet Take 10 mg by mouth daily. 05/03/21  Yes [provider]  losartan-hydrochlorothiazide (HYZAAR) 100-12.5 MG tablet Take 1 tablet by mouth daily.   Yes [provider]  LYRICA 50 MG capsule Take 50 mg by mouth 2 (two) times daily. 04/15/17  Yes [provider]  Multiple Vitamins-Minerals (MULTIVITAMIN WITH MINERALS) tablet Take 1 tablet by mouth daily.   Yes [provider]  nebivolol (BYSTOLIC) 5 MG tablet Take 5 mg by mouth at bedtime.   Yes [provider]  Omega-3 Fatty Acids (FISH OIL) 1000 MG CAPS Take 1,000 mg by  mouth daily.   Yes [provider]  pantoprazole (PROTONIX) 40 MG tablet Take 1 tablet (40 mg total) by mouth 2 (two) times daily before a meal. 05/22/21 11/18/21 Yes Marna Weniger K, DO  predniSONE (DELTASONE) 5 MG tablet prednisone 5 mg tablet  TAKE 6 TABLETS BY MOUTH DAILY FOR 3 DAYS, 5 TABS X3 DAYS, 4 TABS X3 DAYS, 3 TABS X3 DAYS, 2 TABS X3 DAYS, 1 TAB X3 DAYS   Yes [provider]  rOPINIRole (REQUIP) 0.5 MG tablet Take 1.5 mg by mouth at bedtime. 05/04/20  Yes [provider]  simvastatin (ZOCOR) 20 MG tablet Take 20 mg by mouth every Monday, Wednesday, and Friday. 05/03/21  Yes [provider]  traZODone (DESYREL) 100 MG tablet Take 100 mg by mouth at bedtime. 04/15/17  Yes [provider]  TURMERIC CURCUMIN PO Take 1,000 mg by mouth daily.   Yes [provider]  vortioxetine HBr (TRINTELLIX) 10 MG TABS tablet Take 10 mg by mouth daily.   Yes [provider]  ACCU-CHEK GUIDE test strip See admin instructions. 12/12/20   [provider]  meclizine (ANTIVERT) 25 MG tablet Take 25 mg by mouth 3 (three) times daily as needed for dizziness.    [provider]  ondansetron (ZOFRAN) 4 MG tablet Take 1 tablet (4 mg total) by mouth every 6 (six) hours. Patient not taking: Reported on 05/22/2021 05/13/21   Azucena Cecil, PA-C    Allergies as of 05/22/2021 - Review Complete 05/22/2021  Allergen Reaction Noted   Ciprofloxacin Nausea And Vomiting and Other (See Comments) 09/26/2012   Codeine Other (See Comments) 09/26/2012   Dicyclomine  01/05/2013   Ibuprofen Other (See Comments) 09/26/2012   Sulfa antibiotics  11/24/2016   Valium [diazepam] Other (See Comments) 09/26/2012    Family History  Problem Relation Age of Onset   Lung cancer Father    Diabetes Mellitus II Father    Crohn's disease Cousin    Hypertension Mother    Cystic fibrosis Mother    Lung cancer Brother    Hypertension Brother    Lupus  Niece    Colon cancer Neg Hx    Celiac disease Neg Hx    Gastric cancer Neg Hx    Esophageal cancer Neg Hx     Social History   Socioeconomic History   Marital status: Single    Spouse name: Not on file   Number of children: Not on file   Years of education: Not on file   Highest education level: Not on file  Occupational History   Not on file  Tobacco Use   Smoking status: Never   Smokeless tobacco: Never  Vaping Use   Vaping Use: Never used  Substance and Sexual Activity   Alcohol use: No   Drug use: No   Sexual activity: Yes  Other Topics Concern   Not on file  Social History Narrative   1 living  2 deceased   Social Determinants of Health   Financial Resource Strain: Not on file  Food Insecurity: Not on file  Transportation Needs: Not on file  Physical Activity: Not on file  Stress: Not on file  Social Connections: Not on file  Intimate Partner Violence: Not on file    Review of Systems: See HPI, otherwise negative ROS  Physical Exam: Vital signs in last 24 hours: Temp:  [98.1 F (36.7 C)] 98.1 F (36.7 C) (01/16 0926) Pulse Rate:  [69] 69 (01/16 0926) Resp:  [16] 16 (01/16 0926) BP: (118)/(74) 118/74 (01/16 0926) SpO2:  [99 %] 99 % (01/16 0926)   General:   Alert,  Well-developed, well-nourished, pleasant and cooperative in NAD Head:  Normocephalic and atraumatic. Eyes:  Sclera clear, no icterus.   Conjunctiva pink. Ears:  Normal auditory acuity. Nose:  No deformity, discharge,  or lesions. Mouth:  No deformity or lesions, dentition normal. Neck:  Supple; no masses or thyromegaly. Lungs:  Clear throughout to auscultation.   No wheezes, crackles, or rhonchi. No acute distress. Heart:  Regular rate and rhythm; no murmurs, clicks, rubs,  or gallops. Abdomen:  Soft, nontender and nondistended. No masses, hepatosplenomegaly or hernias noted. Normal bowel sounds, without guarding, and without rebound.   Msk:  Symmetrical without gross deformities.  Normal posture. Extremities:  Without clubbing or edema. Neurologic:  Alert and  oriented x4;  grossly normal neurologically. Skin:  Intact without significant lesions or rashes. Cervical Nodes:  No significant cervical adenopathy. Psych:  Alert and cooperative. Normal mood and affect.  Impression/Plan: KEIGAN GIRTEN is here for an EGD with possible dilation for GERD/Dysphagia.   The risks of the procedure including infection, bleed, or perforation as well as benefits, limitations, alternatives and imponderables have been reviewed with the patient. Questions have been answered. All parties agreeable.

## 2021-07-07 NOTE — Anesthesia Postprocedure Evaluation (Signed)
Anesthesia Post Note  Patient: Georgette Helmer Gallaher  Procedure(s) Performed: ESOPHAGOGASTRODUODENOSCOPY (EGD) WITH PROPOFOL BALLOON DILATION BIOPSY  Patient location during evaluation: Endoscopy Anesthesia Type: General Level of consciousness: awake and alert Pain management: pain level controlled Vital Signs Assessment: post-procedure vital signs reviewed and stable Respiratory status: spontaneous breathing, nonlabored ventilation, respiratory function stable and patient connected to nasal cannula oxygen Cardiovascular status: blood pressure returned to baseline and stable Postop Assessment: no apparent nausea or vomiting Anesthetic complications: no   No notable events documented.   Last Vitals:  Vitals:   07/07/21 1044 07/07/21 1052  BP: (!) 88/43 99/61  Pulse:  68  Resp: (!) 22   Temp:    SpO2: 96% 96%    Last Pain:  Vitals:   07/07/21 1052  TempSrc:   PainSc: 0-No pain                 Trixie Rude

## 2021-07-07 NOTE — Op Note (Signed)
[facility] Patient Name: Chloe Golden Procedure Date: 07/07/2021 10:24 AM MRN: [id number] Date of Birth: 10-17-1945 Attending MD: Elon Alas. Abbey Chatters DO CSN: [id number] Age: 76 Admit Type: Outpatient Procedure:                Upper GI endoscopy Indications:              Dysphagia, Heartburn Providers:                Elon Alas. Abbey Chatters, DO, Charlsie Quest. Theda Sers RN, RN,                            Raphael Gibney, Technician Referring MD:              Medicines:                See the Anesthesia note for documentation of the                            administered medications Complications:            No immediate complications. Estimated Blood Loss:     Estimated blood loss was minimal. Procedure:                Pre-Anesthesia Assessment:                           - The anesthesia plan was to use monitored                            anesthesia care (MAC).                           After obtaining informed consent, the endoscope was                            passed under direct vision. Throughout the                            procedure, the patient's blood pressure, pulse, and                            oxygen saturations were monitored continuously. The                            GIF-H190 ([id number]) scope was introduced through the                            mouth, and advanced to the second part of duodenum.                            The upper GI endoscopy was accomplished without                            difficulty. The patient tolerated the procedure                            well. Scope In: 10:34:20  AM Scope Out: 10:39:00 AM Total Procedure Duration: 0 hours 4 minutes 40 seconds  Findings:      There is no endoscopic evidence of bleeding, esophagitis, hiatal hernia,       ulcerations or varices in the entire esophagus.      No endoscopic abnormality was evident in the esophagus to explain the       patient's complaint of dysphagia. Preparations were made for empiric        dilation. A TTS dilator was passed through the scope. Dilation with an       18-19-20 mm balloon dilator was performed to 20 mm. Dilation was       performed with a mild resistance at 20 mm. Estimated blood loss was none.      Localized mild inflammation characterized by erythema was found in the       gastric body. Biopsies were taken with a cold forceps for Helicobacter       pylori testing.      The duodenal bulb, first portion of the duodenum and second portion of       the duodenum were normal. Impression:               - Gastritis. Biopsied.                           - Normal duodenal bulb, first portion of the                            duodenum and second portion of the duodenum. Moderate Sedation:      Per Anesthesia Care Recommendation:           - Patient has a contact number available for                            emergencies. The signs and symptoms of potential                            delayed complications were discussed with the                            patient. Return to normal activities tomorrow.                            Written discharge instructions were provided to the                            patient.                           - Resume previous diet.                           - Continue present medications.                           - Await pathology results.                           - Repeat upper endoscopy PRN for retreatment.                           -  Return to GI clinic in 4 months.                           - Use a proton pump inhibitor PO BID. Procedure Code(s):        --- Professional ---                           [phone], Esophagogastroduodenoscopy, flexible,                            transoral; with biopsy, single or multiple Diagnosis Code(s):        --- Professional ---                           K29.70, Gastritis, unspecified, without bleeding                           R13.10, Dysphagia, unspecified                           R12,  Heartburn CPT copyright 2019 [employer]. All rights reserved. The codes documented in this report are preliminary and upon coder review may  be revised to meet current compliance requirements. Elon Alas. Abbey Chatters, DO [place] Abbey Chatters, DO 07/07/2021 10:43:29 AM This report has been signed electronically. Number of Addenda: 0

## 2021-07-07 NOTE — Discharge Instructions (Addendum)
EGD Discharge instructions Please read the instructions outlined below and refer to this sheet in the next few weeks. These discharge instructions provide you with general information on caring for yourself after you leave the hospital. Your doctor may also give you specific instructions. While your treatment has been planned according to the most current medical practices available, unavoidable complications occasionally occur. If you have any problems or questions after discharge, please call your doctor. ACTIVITY You may resume your regular activity but move at a slower pace for the next 24 hours.  Take frequent rest periods for the next 24 hours.  Walking will help expel (get rid of) the air and reduce the bloated feeling in your abdomen.  No driving for 24 hours (because of the anesthesia (medicine) used during the test).  You may shower.  Do not sign any important legal documents or operate any machinery for 24 hours (because of the anesthesia used during the test).  NUTRITION Drink plenty of fluids.  You may resume your normal diet.  Begin with a light meal and progress to your normal diet.  Avoid alcoholic beverages for 24 hours or as instructed by your caregiver.  MEDICATIONS You may resume your normal medications unless your caregiver tells you otherwise.  WHAT YOU CAN EXPECT TODAY You may experience abdominal discomfort such as a feeling of fullness or gas pains.  FOLLOW-UP Your doctor will discuss the results of your test with you.  SEEK IMMEDIATE MEDICAL ATTENTION IF ANY OF THE FOLLOWING OCCUR: Excessive nausea (feeling sick to your stomach) and/or vomiting.  Severe abdominal pain and distention (swelling).  Trouble swallowing.  Temperature over 101 F (37.8 C).  Rectal bleeding or vomiting of blood.   Your EGD revealed mild amount inflammation in your stomach.  I took biopsies of this to rule out infection with a bacteria called H. pylori.  Await pathology results, my  office will contact you.  You had a slight narrowing of your esophagus so I stretched this with a balloon today.  Hopefully this helps with your swallowing.  Continue on Protonix 40 mg twice daily.  Avoid NSAIDs as best as you can.  Follow-up with GI in 4 months    I hope you have a great rest of your week!  Elon Alas. Abbey Chatters, D.O. Gastroenterology and Hepatology [facility]

## 2021-07-07 NOTE — Transfer of Care (Signed)
Immediate Anesthesia Transfer of Care Note  Patient: Chloe Golden  Procedure(s) Performed: ESOPHAGOGASTRODUODENOSCOPY (EGD) WITH PROPOFOL BALLOON DILATION BIOPSY  Patient Location: Short Stay  Anesthesia Type:General  Level of Consciousness: awake  Airway & Oxygen Therapy: Patient Spontanous Breathing  Post-op Assessment: Report given to RN and Post -op Vital signs reviewed and stable  Post vital signs: Reviewed and stable  Last Vitals:  Vitals Value Taken Time  BP    Temp    Pulse    Resp    SpO2      Last Pain:  Vitals:   07/07/21 1034  TempSrc:   PainSc: 0-No pain      Patients Stated Pain Goal: 5 ([id number])  Complications: No notable events documented.

## 2021-07-07 NOTE — Anesthesia Procedure Notes (Signed)
Date/Time: 07/07/2021 10:33 AM Performed by: Orlie Dakin, CRNA Pre-anesthesia Checklist: Patient identified, Emergency Drugs available, Suction available and Patient being monitored Patient Re-evaluated:Patient Re-evaluated prior to induction Oxygen Delivery Method: Nasal cannula Induction Type: IV induction Placement Confirmation: positive ETCO2

## 2021-07-08 LAB — SURGICAL PATHOLOGY

## 2021-07-09 ENCOUNTER — Encounter (HOSPITAL_COMMUNITY): Payer: Self-pay | Admitting: Internal Medicine

## 2021-07-14 ENCOUNTER — Telehealth: Payer: Self-pay | Admitting: Internal Medicine

## 2021-07-14 NOTE — Telephone Encounter (Signed)
Returned the pt's call and was advised by her she needs her results of EGD and I advised that you have not released them to me yet. Also pt isn't due for colonoscopy until 2024 but she states she needs something done about her sphincter. Its causing her a lot of problems and she is having to wear depends and she is tired of it and Dr. Oneida Alar was going to refer her out at one time but they didn't get around to it. Pt states she is having diarrhea x 4 a day without warning no pain or warning it just shoots out. Please advise

## 2021-07-14 NOTE — Telephone Encounter (Signed)
Pt calling for her EGD results from last week and also said she wanted to schedule a colonoscopy and wished she could've had both procedure done at the same time. Please call her and advise if she needs another OV to schedule colonoscopy. [phone]

## 2021-07-15 DIAGNOSIS — H57813 Brow ptosis, bilateral: Secondary | ICD-10-CM | POA: Diagnosis not present

## 2021-07-15 DIAGNOSIS — H02413 Mechanical ptosis of bilateral eyelids: Secondary | ICD-10-CM | POA: Diagnosis not present

## 2021-07-16 DIAGNOSIS — Z0120 Encounter for dental examination and cleaning without abnormal findings: Secondary | ICD-10-CM | POA: Diagnosis not present

## 2021-07-21 ENCOUNTER — Telehealth: Payer: Self-pay | Admitting: Internal Medicine

## 2021-07-21 DIAGNOSIS — R531 Weakness: Secondary | ICD-10-CM | POA: Diagnosis not present

## 2021-07-21 DIAGNOSIS — E1165 Type 2 diabetes mellitus with hyperglycemia: Secondary | ICD-10-CM | POA: Diagnosis not present

## 2021-07-21 DIAGNOSIS — R293 Abnormal posture: Secondary | ICD-10-CM | POA: Diagnosis not present

## 2021-07-21 DIAGNOSIS — R2689 Other abnormalities of gait and mobility: Secondary | ICD-10-CM | POA: Diagnosis not present

## 2021-07-21 DIAGNOSIS — M5440 Lumbago with sciatica, unspecified side: Secondary | ICD-10-CM | POA: Diagnosis not present

## 2021-07-21 DIAGNOSIS — I10 Essential (primary) hypertension: Secondary | ICD-10-CM | POA: Diagnosis not present

## 2021-07-21 DIAGNOSIS — R296 Repeated falls: Secondary | ICD-10-CM | POA: Diagnosis not present

## 2021-07-21 NOTE — Telephone Encounter (Signed)
Attempted to call pt back x 2 and all circuits were busy.

## 2021-07-21 NOTE — Telephone Encounter (Signed)
PATIENT CALLED AGAIN ASKING FOR HER RESULTS

## 2021-07-22 ENCOUNTER — Telehealth: Payer: Self-pay | Admitting: Internal Medicine

## 2021-07-22 NOTE — Telephone Encounter (Signed)
Returned the pt's call to [phone] to speak with the pt. She called x 2 weeks ago complaining of having diarrhea 6 to 8 months to the point where she is wearing diapers and states she cant go anywhere. She is continuously messing up her clothes and very aggravated. Please call her she stated to me that Dr. Oneida Alar knew of her not having control of her bowels and was going to send her to have it corrected. Please call the pt regarding this.

## 2021-07-22 NOTE — Telephone Encounter (Signed)
[phone] PLEASE CALL PATIENT ON THIS NUMBER, HER HOME PHONE IS DOWN

## 2021-07-22 NOTE — Telephone Encounter (Signed)
See other note

## 2021-07-22 NOTE — Telephone Encounter (Signed)
[phone] PLEASE CALL PATIENT ON THIS NUMBER, HER HOME PHONE IS DOWN .

## 2021-07-22 NOTE — Telephone Encounter (Signed)
Note sent to Dr. Abbey Chatters regarding this

## 2021-07-23 ENCOUNTER — Telehealth: Payer: Self-pay | Admitting: Internal Medicine

## 2021-07-23 NOTE — Telephone Encounter (Signed)
noted 

## 2021-07-23 NOTE — Telephone Encounter (Signed)
Attempted to call patient in regards to her concerns. No answer, tried both numbers listed.

## 2021-07-25 NOTE — Telephone Encounter (Signed)
Letter will be mailed to the pt to contact us. Between Dr Abbey Chatters and I we have tried several times to contact this pt with both numbers she has listed. Letter done and mailed today.

## 2021-07-29 ENCOUNTER — Encounter: Payer: Self-pay | Admitting: Internal Medicine

## 2021-07-29 DIAGNOSIS — R1310 Dysphagia, unspecified: Secondary | ICD-10-CM | POA: Diagnosis not present

## 2021-07-29 DIAGNOSIS — K219 Gastro-esophageal reflux disease without esophagitis: Secondary | ICD-10-CM | POA: Diagnosis not present

## 2021-07-29 DIAGNOSIS — M5440 Lumbago with sciatica, unspecified side: Secondary | ICD-10-CM | POA: Diagnosis not present

## 2021-07-29 DIAGNOSIS — R159 Full incontinence of feces: Secondary | ICD-10-CM | POA: Insufficient documentation

## 2021-07-29 DIAGNOSIS — M069 Rheumatoid arthritis, unspecified: Secondary | ICD-10-CM | POA: Diagnosis not present

## 2021-07-29 DIAGNOSIS — E1165 Type 2 diabetes mellitus with hyperglycemia: Secondary | ICD-10-CM | POA: Diagnosis not present

## 2021-07-29 DIAGNOSIS — R296 Repeated falls: Secondary | ICD-10-CM | POA: Diagnosis not present

## 2021-07-29 DIAGNOSIS — N3281 Overactive bladder: Secondary | ICD-10-CM | POA: Diagnosis not present

## 2021-07-29 DIAGNOSIS — R293 Abnormal posture: Secondary | ICD-10-CM | POA: Diagnosis not present

## 2021-07-29 DIAGNOSIS — I10 Essential (primary) hypertension: Secondary | ICD-10-CM | POA: Diagnosis not present

## 2021-07-29 DIAGNOSIS — R531 Weakness: Secondary | ICD-10-CM | POA: Diagnosis not present

## 2021-07-29 DIAGNOSIS — R2689 Other abnormalities of gait and mobility: Secondary | ICD-10-CM | POA: Diagnosis not present

## 2021-07-29 DIAGNOSIS — E782 Mixed hyperlipidemia: Secondary | ICD-10-CM | POA: Diagnosis not present

## 2021-07-29 DIAGNOSIS — R69 Illness, unspecified: Secondary | ICD-10-CM | POA: Diagnosis not present

## 2021-07-29 DIAGNOSIS — K581 Irritable bowel syndrome with constipation: Secondary | ICD-10-CM | POA: Diagnosis not present

## 2021-07-30 ENCOUNTER — Other Ambulatory Visit: Payer: Self-pay | Admitting: Urology

## 2021-07-30 ENCOUNTER — Encounter: Payer: Self-pay | Admitting: Urology

## 2021-07-30 ENCOUNTER — Other Ambulatory Visit: Payer: Self-pay

## 2021-07-30 ENCOUNTER — Ambulatory Visit (INDEPENDENT_AMBULATORY_CARE_PROVIDER_SITE_OTHER): Payer: Medicare HMO | Admitting: Urology

## 2021-07-30 VITALS — BP 100/56 | HR 76 | Wt 195.0 lb

## 2021-07-30 DIAGNOSIS — Z87440 Personal history of urinary (tract) infections: Secondary | ICD-10-CM | POA: Diagnosis not present

## 2021-07-30 DIAGNOSIS — N3941 Urge incontinence: Secondary | ICD-10-CM

## 2021-07-30 DIAGNOSIS — N3281 Overactive bladder: Secondary | ICD-10-CM | POA: Diagnosis not present

## 2021-07-30 LAB — URINALYSIS, ROUTINE W REFLEX MICROSCOPIC
Bilirubin, UA: NEGATIVE
Ketones, UA: NEGATIVE
Leukocytes,UA: NEGATIVE
Nitrite, UA: NEGATIVE
Protein,UA: NEGATIVE
RBC, UA: NEGATIVE
Specific Gravity, UA: 1.015 (ref 1.005–1.030)
Urobilinogen, Ur: 0.2 mg/dL (ref 0.2–1.0)
pH, UA: 5.5 (ref 5.0–7.5)

## 2021-07-30 MED ORDER — CIPROFLOXACIN HCL 500 MG PO TABS
500.0000 mg | ORAL_TABLET | Freq: Once | ORAL | Status: AC
  Administered 2021-07-30: 500 mg via ORAL

## 2021-07-30 NOTE — Progress Notes (Signed)
Assessment: 1. OAB (overactive bladder)   2. Urge incontinence   3. History of UTI     Plan: Cipro x 1 following cystoscopy Schedule for urodynamics at [facility] Follow-up after UDS done  Chief Complaint: Chief Complaint  Patient presents with   Urinary Incontinence    HPI: Chloe Golden is a 76 y.o. female who presents for continued evaluation of overactive bladder symptoms and urge incontinence.   She has had urinary symptoms for at least 20 years.  She has been followed by Dr. Titus Mould in [place], [place]. She was previously managed with Myrbetriq and was changed to [place] approximately 6 months ago.  She continues to have symptoms of urgency, urge incontinence, primarily at night, hesitancy, intermittent stream, stranguria, and sensation of incomplete emptying.  She uses pads on occasion.  Further evaluation with cystoscopy and urodynamics was recommended by Dr. Titus Mould.  She has not proceeded with this evaluation due to a change in her insurance.  She also reports a history of recurrent UTIs for the past 10 years.  She has been treated with multiple antibiotics.  She currently has a prescription for antibiotics for as needed use.  She last took antibiotics approximately 3 weeks ago.  No culture results available for review. PVR from 07/02/2021: 37 mL  She returns today for cystoscopy.  She continues to have symptoms of frequency, urgency, nocturia, and urge incontinence.  No dysuria or gross hematuria.  Portions of the above documentation were copied from a prior visit for review purposes only.  Allergies: Allergies  Allergen Reactions   Codeine Other (See Comments)    Constipation   Dicyclomine     TURNED HER SKIN BLUE   Ibuprofen Other (See Comments)    Ulcers    Sulfa Antibiotics     Yeast infections    Valium [Diazepam] Other (See Comments)    Alters mental status    PMH: Past Medical History:  Diagnosis Date   Acid reflux    Arthritis     Degenerative arthritis of cervical spine    Fibromyalgia    Functional gastrointestinal disorder    GERD (gastroesophageal reflux disease)    Glaucoma    Hiatal hernia    Hypertension    Neuropathy    OSA on CPAP    PAD (peripheral artery disease) ([facility])    Mild by arterial Dopplers April 2019   Rheumatoid arthritis(714.0)    Venous reflux     PSH: Past Surgical History:  Procedure Laterality Date   ABDOMINAL HYSTERECTOMY     BACTERIAL OVERGROWTH TEST N/A 11/09/2012   Procedure: BACTERIAL OVERGROWTH TEST;  Surgeon: Danie Binder, MD;  [facility];  Service: Endoscopy;  Laterality: N/A;  7:30   BALLOON DILATION N/A 07/07/2021   Procedure: BALLOON DILATION;  Surgeon: Eloise Harman, DO;  [facility];  Service: Endoscopy;  Laterality: N/A;   BIOPSY N/A 10/28/2012   Procedure: BIOPSY;  Surgeon: Danie Binder, MD;  [facility];  Service: Endoscopy;  Laterality: N/A;  DUODENAL BIOPSY   BIOPSY  07/07/2021   Procedure: BIOPSY;  Surgeon: Eloise Harman, DO;  [facility];  Service: Endoscopy;;   BLADDER SURGERY     CARPAL TUNNEL RELEASE Bilateral    CHOLECYSTECTOMY     COLONOSCOPY WITH ESOPHAGOGASTRODUODENOSCOPY (EGD) N/A 10/28/2012   ZOX:[id number] diverticulosis in the descending colon and sigmoid colon/Small internal hemorrhoids   ESOPHAGOGASTRODUODENOSCOPY N/A 11/27/2016   Procedure: ESOPHAGOGASTRODUODENOSCOPY (EGD);  Surgeon: Oneida Alar,  Marga Melnick, MD;  [facility];  Service: Endoscopy;  Laterality: N/A;  830    ESOPHAGOGASTRODUODENOSCOPY (EGD) WITH ESOPHAGEAL DILATION N/A 10/28/2012   TDV:[id number] CERVIAL WEB, S/P DILATION/MILD Non-erosive gastritis & DUODENITIS   ESOPHAGOGASTRODUODENOSCOPY (EGD) WITH PROPOFOL N/A 07/07/2021   Procedure: ESOPHAGOGASTRODUODENOSCOPY (EGD) WITH PROPOFOL;  Surgeon: Eloise Harman, DO;  [facility];  Service: Endoscopy;  Laterality: N/A;  9:30am   SAVORY DILATION N/A 11/27/2016    Procedure: SAVORY DILATION;  Surgeon: Danie Binder, MD;  [facility];  Service: Endoscopy;  Laterality: N/A;   TOTAL KNEE ARTHROPLASTY Right     SH: Social History   Tobacco Use   Smoking status: Never   Smokeless tobacco: Never  Vaping Use   Vaping Use: Never used  Substance Use Topics   Alcohol use: No   Drug use: No    ROS: Constitutional:  Negative for fever, chills, weight loss CV: Negative for chest pain, previous MI, hypertension Respiratory:  Negative for shortness of breath, wheezing, sleep apnea, frequent cough GI:  Negative for nausea, vomiting, bloody stool, GERD  PE: BP (!) 100/56    Pulse 76    Wt 195 lb (88.5 kg)    BMI 33.47 kg/m  GENERAL APPEARANCE:  Well appearing, well developed, well nourished, NAD HEENT:  Atraumatic, normocephalic, oropharynx clear NECK:  Supple without lymphadenopathy or thyromegaly ABDOMEN:  Soft, non-tender, no masses EXTREMITIES:  Moves all extremities well, without clubbing, cyanosis, or edema NEUROLOGIC:  Alert and oriented x 3, normal gait, CN II-XII grossly intact MENTAL STATUS:  appropriate BACK:  Non-tender to palpation, No CVAT SKIN:  Warm, dry, and intact GU: Urethra: no leakage with cough or valsalva Vagina: Grade 2 cystocele, atrophic changes   Results: U/A dipstick negative  Procedure:  Flexible Cystourethroscopy  Pre-operative Diagnosis: Urinary incontinence  Post-operative Diagnosis: Urinary incontinence  Anesthesia:  local with lidocaine jelly  Surgical Narrative:  After appropriate informed consent was obtained, the patient was prepped and draped in the usual sterile fashion in the supine position.  The patient was correctly identified and the proper procedure delineated prior to proceeding.  Sterile lidocaine gel was instilled in the urethra. The flexible cystoscope was introduced without difficulty.  Findings:  Urethra: Normal    Bladder: Normal  Ureteral orifices:  normal  Additional findings: None  Saline bladder wash for cytology was not performed.    The cystoscope was then removed.  The patient tolerated the procedure well.

## 2021-07-31 ENCOUNTER — Other Ambulatory Visit: Payer: Self-pay | Admitting: Urology

## 2021-07-31 DIAGNOSIS — R293 Abnormal posture: Secondary | ICD-10-CM | POA: Diagnosis not present

## 2021-07-31 DIAGNOSIS — M5440 Lumbago with sciatica, unspecified side: Secondary | ICD-10-CM | POA: Diagnosis not present

## 2021-07-31 DIAGNOSIS — R2689 Other abnormalities of gait and mobility: Secondary | ICD-10-CM | POA: Diagnosis not present

## 2021-07-31 DIAGNOSIS — R296 Repeated falls: Secondary | ICD-10-CM | POA: Diagnosis not present

## 2021-07-31 DIAGNOSIS — R531 Weakness: Secondary | ICD-10-CM | POA: Diagnosis not present

## 2021-07-31 DIAGNOSIS — R159 Full incontinence of feces: Secondary | ICD-10-CM

## 2021-08-04 DIAGNOSIS — R531 Weakness: Secondary | ICD-10-CM | POA: Diagnosis not present

## 2021-08-04 DIAGNOSIS — R2689 Other abnormalities of gait and mobility: Secondary | ICD-10-CM | POA: Diagnosis not present

## 2021-08-04 DIAGNOSIS — M5440 Lumbago with sciatica, unspecified side: Secondary | ICD-10-CM | POA: Diagnosis not present

## 2021-08-04 DIAGNOSIS — B351 Tinea unguium: Secondary | ICD-10-CM | POA: Diagnosis not present

## 2021-08-04 DIAGNOSIS — M79675 Pain in left toe(s): Secondary | ICD-10-CM | POA: Diagnosis not present

## 2021-08-04 DIAGNOSIS — R296 Repeated falls: Secondary | ICD-10-CM | POA: Diagnosis not present

## 2021-08-04 DIAGNOSIS — E1151 Type 2 diabetes mellitus with diabetic peripheral angiopathy without gangrene: Secondary | ICD-10-CM | POA: Diagnosis not present

## 2021-08-04 DIAGNOSIS — R293 Abnormal posture: Secondary | ICD-10-CM | POA: Diagnosis not present

## 2021-08-05 DIAGNOSIS — Z0120 Encounter for dental examination and cleaning without abnormal findings: Secondary | ICD-10-CM | POA: Diagnosis not present

## 2021-08-07 DIAGNOSIS — R531 Weakness: Secondary | ICD-10-CM | POA: Diagnosis not present

## 2021-08-07 DIAGNOSIS — R296 Repeated falls: Secondary | ICD-10-CM | POA: Diagnosis not present

## 2021-08-07 DIAGNOSIS — R293 Abnormal posture: Secondary | ICD-10-CM | POA: Diagnosis not present

## 2021-08-07 DIAGNOSIS — M5440 Lumbago with sciatica, unspecified side: Secondary | ICD-10-CM | POA: Diagnosis not present

## 2021-08-07 DIAGNOSIS — R2689 Other abnormalities of gait and mobility: Secondary | ICD-10-CM | POA: Diagnosis not present

## 2021-08-08 DIAGNOSIS — F324 Major depressive disorder, single episode, in partial remission: Secondary | ICD-10-CM | POA: Diagnosis not present

## 2021-08-08 DIAGNOSIS — F0390 Unspecified dementia without behavioral disturbance: Secondary | ICD-10-CM | POA: Diagnosis not present

## 2021-08-08 DIAGNOSIS — E1142 Type 2 diabetes mellitus with diabetic polyneuropathy: Secondary | ICD-10-CM | POA: Diagnosis not present

## 2021-08-08 DIAGNOSIS — M069 Rheumatoid arthritis, unspecified: Secondary | ICD-10-CM | POA: Diagnosis not present

## 2021-08-08 DIAGNOSIS — E785 Hyperlipidemia, unspecified: Secondary | ICD-10-CM | POA: Diagnosis not present

## 2021-08-08 DIAGNOSIS — E669 Obesity, unspecified: Secondary | ICD-10-CM | POA: Diagnosis not present

## 2021-08-08 DIAGNOSIS — G8929 Other chronic pain: Secondary | ICD-10-CM | POA: Diagnosis not present

## 2021-08-08 DIAGNOSIS — F411 Generalized anxiety disorder: Secondary | ICD-10-CM | POA: Diagnosis not present

## 2021-08-08 DIAGNOSIS — E1151 Type 2 diabetes mellitus with diabetic peripheral angiopathy without gangrene: Secondary | ICD-10-CM | POA: Diagnosis not present

## 2021-08-08 DIAGNOSIS — G4700 Insomnia, unspecified: Secondary | ICD-10-CM | POA: Diagnosis not present

## 2021-08-08 DIAGNOSIS — G2581 Restless legs syndrome: Secondary | ICD-10-CM | POA: Diagnosis not present

## 2021-08-08 DIAGNOSIS — I10 Essential (primary) hypertension: Secondary | ICD-10-CM | POA: Diagnosis not present

## 2021-08-08 DIAGNOSIS — R69 Illness, unspecified: Secondary | ICD-10-CM | POA: Diagnosis not present

## 2021-08-11 DIAGNOSIS — R531 Weakness: Secondary | ICD-10-CM | POA: Diagnosis not present

## 2021-08-11 DIAGNOSIS — R296 Repeated falls: Secondary | ICD-10-CM | POA: Diagnosis not present

## 2021-08-11 DIAGNOSIS — R293 Abnormal posture: Secondary | ICD-10-CM | POA: Diagnosis not present

## 2021-08-11 DIAGNOSIS — M5440 Lumbago with sciatica, unspecified side: Secondary | ICD-10-CM | POA: Diagnosis not present

## 2021-08-11 DIAGNOSIS — R2689 Other abnormalities of gait and mobility: Secondary | ICD-10-CM | POA: Diagnosis not present

## 2021-08-13 DIAGNOSIS — R293 Abnormal posture: Secondary | ICD-10-CM | POA: Diagnosis not present

## 2021-08-13 DIAGNOSIS — R296 Repeated falls: Secondary | ICD-10-CM | POA: Diagnosis not present

## 2021-08-13 DIAGNOSIS — R531 Weakness: Secondary | ICD-10-CM | POA: Diagnosis not present

## 2021-08-13 DIAGNOSIS — M5440 Lumbago with sciatica, unspecified side: Secondary | ICD-10-CM | POA: Diagnosis not present

## 2021-08-13 DIAGNOSIS — R2689 Other abnormalities of gait and mobility: Secondary | ICD-10-CM | POA: Diagnosis not present

## 2021-08-19 DIAGNOSIS — R600 Localized edema: Secondary | ICD-10-CM | POA: Diagnosis not present

## 2021-08-19 DIAGNOSIS — Z79899 Other long term (current) drug therapy: Secondary | ICD-10-CM | POA: Diagnosis not present

## 2021-08-19 DIAGNOSIS — I10 Essential (primary) hypertension: Secondary | ICD-10-CM | POA: Diagnosis not present

## 2021-08-19 DIAGNOSIS — M797 Fibromyalgia: Secondary | ICD-10-CM | POA: Diagnosis not present

## 2021-08-19 DIAGNOSIS — M7989 Other specified soft tissue disorders: Secondary | ICD-10-CM | POA: Diagnosis not present

## 2021-08-19 DIAGNOSIS — M1990 Unspecified osteoarthritis, unspecified site: Secondary | ICD-10-CM | POA: Diagnosis not present

## 2021-08-19 DIAGNOSIS — R519 Headache, unspecified: Secondary | ICD-10-CM | POA: Diagnosis not present

## 2021-08-19 DIAGNOSIS — R413 Other amnesia: Secondary | ICD-10-CM | POA: Diagnosis not present

## 2021-08-19 DIAGNOSIS — K219 Gastro-esophageal reflux disease without esophagitis: Secondary | ICD-10-CM | POA: Diagnosis not present

## 2021-08-19 DIAGNOSIS — M0609 Rheumatoid arthritis without rheumatoid factor, multiple sites: Secondary | ICD-10-CM | POA: Diagnosis not present

## 2021-08-22 DIAGNOSIS — R600 Localized edema: Secondary | ICD-10-CM | POA: Diagnosis not present

## 2021-08-25 DIAGNOSIS — N3946 Mixed incontinence: Secondary | ICD-10-CM | POA: Diagnosis not present

## 2021-08-28 ENCOUNTER — Encounter: Payer: Self-pay | Admitting: Urology

## 2021-08-28 ENCOUNTER — Other Ambulatory Visit: Payer: Self-pay

## 2021-08-28 ENCOUNTER — Ambulatory Visit (INDEPENDENT_AMBULATORY_CARE_PROVIDER_SITE_OTHER): Payer: Medicare HMO | Admitting: Urology

## 2021-08-28 VITALS — BP 99/65 | HR 73

## 2021-08-28 DIAGNOSIS — R159 Full incontinence of feces: Secondary | ICD-10-CM | POA: Diagnosis not present

## 2021-08-28 DIAGNOSIS — N3941 Urge incontinence: Secondary | ICD-10-CM | POA: Diagnosis not present

## 2021-08-28 DIAGNOSIS — Z87440 Personal history of urinary (tract) infections: Secondary | ICD-10-CM | POA: Diagnosis not present

## 2021-08-28 NOTE — Progress Notes (Signed)
? ?Assessment: ?1. Urge incontinence   ?2. History of UTI   ?3. Incontinence of feces, unspecified fecal incontinence type   ? ? ?Plan: ?I personally reviewed the urodynamic results from 08/25/2021.  I discussed these results with the patient in detail today.  I also reviewed her records regarding her prior urology evaluation in [place].  She has tried multiple medications and InterStim without success.  Given her current urodynamic results, I discussed a potential role for pelvic floor physical therapy.   ?Also given the complexity of her clinical presentation, I would like to refer her to Dr. Bjorn Loser for further evaluation and management. ?I have put in a referral to Dr. Arelia Longest with GI regarding her fecal incontinence. ?Will arrange follow-up as needed after above referrals. ? ?I personally spent 30 minutes involved in face to face and non-face-to-face activities for this patient on the day of the visit.  Professional time spent included the following activities, in addition to those noted in the documentation: reviewing urodynamic results, discussing results with patient, arranging referral for PT, GI, and urology evaluation. ? ?Chief Complaint: ?Chief Complaint  ?Patient presents with  ? Urinary Incontinence  ? ? ?HPI: ?Chloe Golden is a 76 y.o. female who presents for continued evaluation of overactive bladder symptoms and urge incontinence.   ?She has had urinary symptoms for at least 20 years.  She has been followed by Dr. Titus Mould in [place], [place].  Review of her records from Dr. Titus Mould indicates that she previously underwent treatment for stress urinary incontinence with a TVT sling in November 2013.  She also underwent InterStim implant in January 2020.  She initially had good results with the InterStim.  She apparently had a return of her urinary symptoms and was placed on Myrbetriq.  Her InterStim was ultimately removed in May 2021 due to persistence of her urinary symptoms.  She has  tried other medications for her urinary symptoms without benefit.  She was previously managed with Myrbetriq and was changed to [place] approximately 6 months ago.  She continued to have symptoms of urgency, urge incontinence, primarily at night, hesitancy, intermittent stream, stranguria, and sensation of incomplete emptying.  She uses pads on occasion.  Further evaluation with cystoscopy and urodynamics was recommended by Dr. Titus Mould.  She has not proceeded with this evaluation due to a change in her insurance. ? ?She also reports a history of recurrent UTIs for the past 10 years.  She has been treated with multiple antibiotics.  She currently has a prescription for antibiotics for as needed use.  She last took antibiotics approximately 3 weeks ago.  No culture results available for review. ?PVR from 07/02/2021: 37 mL ? ?At her visit in 2/23, she continued to have symptoms of frequency, urgency, nocturia, and urge incontinence.  No dysuria or gross hematuria. ?Pelvic exam demonstrated a grade 2 cystocele. ?Cystoscopy showed a normal bladder and urethra. ?She was evaluated with urodynamics on 08/25/2021.  This study showed a small capacity bladder with max capacity of 172 mL and increased sensation.  No bladder instability was seen.  No stress incontinence noted.  Voiding phase showed a peak pressure of 16 cm of water with complete emptying.  Her flow was intermittent and she did have increased EMG activity during voiding. ? ?She returns today to discuss the urodynamic results.  Her incontinence symptoms remain unchanged.  No dysuria or gross hematuria. ? ?Portions of the above documentation were copied from a prior visit for review purposes only. ? ?Allergies: ?  Allergies  ?Allergen Reactions  ? Codeine Other (See Comments)  ?  Constipation  ? Dicyclomine   ?  TURNED HER SKIN BLUE  ? Ibuprofen Other (See Comments)  ?  Ulcers   ? Sulfa Antibiotics   ?  Yeast infections   ? Valium [Diazepam] Other (See Comments)  ?   Alters mental status  ? ? ?PMH: ?Past Medical History:  ?Diagnosis Date  ? Acid reflux   ? Arthritis   ? Degenerative arthritis of cervical spine   ? Fibromyalgia   ? Functional gastrointestinal disorder   ? GERD (gastroesophageal reflux disease)   ? Glaucoma   ? Hiatal hernia   ? Hypertension   ? Neuropathy   ? OSA on CPAP   ? PAD (peripheral artery disease) ([facility])   ? Mild by arterial Dopplers April 2019  ? Rheumatoid arthritis(714.0)   ? Venous reflux   ? ? ?PSH: ?Past Surgical History:  ?Procedure Laterality Date  ? ABDOMINAL HYSTERECTOMY    ? BACTERIAL OVERGROWTH TEST N/A 11/09/2012  ? Procedure: BACTERIAL OVERGROWTH TEST;  Surgeon: Danie Binder, MD;  [facility];  Service: Endoscopy;  Laterality: N/A;  7:30  ? BALLOON DILATION N/A 07/07/2021  ? Procedure: BALLOON DILATION;  Surgeon: Eloise Harman, DO;  [facility];  Service: Endoscopy;  Laterality: N/A;  ? BIOPSY N/A 10/28/2012  ? Procedure: BIOPSY;  Surgeon: Danie Binder, MD;  [facility];  Service: Endoscopy;  Laterality: N/A;  DUODENAL BIOPSY  ? BIOPSY  07/07/2021  ? Procedure: BIOPSY;  Surgeon: Eloise Harman, DO;  [facility];  Service: Endoscopy;;  ? BLADDER SURGERY    ? CARPAL TUNNEL RELEASE Bilateral   ? CHOLECYSTECTOMY    ? COLONOSCOPY WITH ESOPHAGOGASTRODUODENOSCOPY (EGD) N/A 10/28/2012  ? YDX:AJOINOMV diverticulosis in the descending colon and sigmoid colon/Small internal hemorrhoids  ? ESOPHAGOGASTRODUODENOSCOPY N/A 11/27/2016  ? Procedure: ESOPHAGOGASTRODUODENOSCOPY (EGD);  Surgeon: Danie Binder, MD;  [facility];  Service: Endoscopy;  Laterality: N/A;  830 ?  ? ESOPHAGOGASTRODUODENOSCOPY (EGD) WITH ESOPHAGEAL DILATION N/A 10/28/2012  ? EHM:CNOBSJGG CERVIAL WEB, S/P DILATION/MILD Non-erosive gastritis & DUODENITIS  ? ESOPHAGOGASTRODUODENOSCOPY (EGD) WITH PROPOFOL N/A 07/07/2021  ? Procedure: ESOPHAGOGASTRODUODENOSCOPY (EGD) WITH PROPOFOL;  Surgeon: Eloise Harman, DO;   [facility];  Service: Endoscopy;  Laterality: N/A;  9:30am  ? SAVORY DILATION N/A 11/27/2016  ? Procedure: SAVORY DILATION;  Surgeon: Danie Binder, MD;  [facility];  Service: Endoscopy;  Laterality: N/A;  ? TOTAL KNEE ARTHROPLASTY Right   ? ? ?SH: ?Social History  ? ?Tobacco Use  ? Smoking status: Never  ? Smokeless tobacco: Never  ?Vaping Use  ? Vaping Use: Never used  ?Substance Use Topics  ? Alcohol use: No  ? Drug use: No  ? ? ?ROS: ?Constitutional:  Negative for fever, chills, weight loss ?CV: Negative for chest pain, previous MI, hypertension ?Respiratory:  Negative for shortness of breath, wheezing, sleep apnea, frequent cough ?GI:  Negative for nausea, vomiting, bloody stool, GERD ? ?PE: ?BP 99/65   Pulse 73  ?GENERAL APPEARANCE:  Well appearing, well developed, well nourished, NAD ?HEENT:  Atraumatic, normocephalic, oropharynx clear ?NECK:  Supple without lymphadenopathy or thyromegaly ?ABDOMEN:  Soft, non-tender, no masses ?EXTREMITIES:  Moves all extremities well, without clubbing, cyanosis, or edema ?NEUROLOGIC:  Alert and oriented x 3, normal gait, CN II-XII grossly intact ?MENTAL STATUS:  appropriate ?BACK:  Non-tender to palpation, No CVAT ?SKIN:  Warm, dry, and intact ? ? ?Results: ?U/A dipstick negative ? ? ? ? ?

## 2021-08-28 NOTE — Patient Instructions (Signed)
Dr. Silvano Rusk  [phone] ?

## 2021-08-29 DIAGNOSIS — N3941 Urge incontinence: Secondary | ICD-10-CM | POA: Diagnosis not present

## 2021-08-29 DIAGNOSIS — H57813 Brow ptosis, bilateral: Secondary | ICD-10-CM | POA: Diagnosis not present

## 2021-08-29 LAB — URINALYSIS, ROUTINE W REFLEX MICROSCOPIC
Bilirubin, UA: NEGATIVE
Glucose, UA: NEGATIVE
Ketones, UA: NEGATIVE
Leukocytes,UA: NEGATIVE
Nitrite, UA: NEGATIVE
Protein,UA: NEGATIVE
RBC, UA: NEGATIVE
Specific Gravity, UA: 1.02 (ref 1.005–1.030)
Urobilinogen, Ur: 0.2 mg/dL (ref 0.2–1.0)
pH, UA: 6.5 (ref 5.0–7.5)

## 2021-08-29 NOTE — Addendum Note (Signed)
Addended by: Dorisann Frames on: 08/29/2021 09:33 AM ? ? Modules accepted: Orders ? ?

## 2021-09-04 ENCOUNTER — Ambulatory Visit (INDEPENDENT_AMBULATORY_CARE_PROVIDER_SITE_OTHER): Payer: Medicare HMO | Admitting: Gastroenterology

## 2021-09-17 ENCOUNTER — Other Ambulatory Visit: Payer: Self-pay

## 2021-09-17 ENCOUNTER — Ambulatory Visit (INDEPENDENT_AMBULATORY_CARE_PROVIDER_SITE_OTHER): Payer: Medicare HMO | Admitting: Internal Medicine

## 2021-09-17 VITALS — BP 118/60 | HR 77 | Temp 96.6°F | Ht 64.0 in | Wt 192.8 lb

## 2021-09-17 DIAGNOSIS — R197 Diarrhea, unspecified: Secondary | ICD-10-CM | POA: Diagnosis not present

## 2021-09-17 DIAGNOSIS — K224 Dyskinesia of esophagus: Secondary | ICD-10-CM | POA: Diagnosis not present

## 2021-09-17 DIAGNOSIS — R198 Other specified symptoms and signs involving the digestive system and abdomen: Secondary | ICD-10-CM

## 2021-09-17 DIAGNOSIS — R159 Full incontinence of feces: Secondary | ICD-10-CM | POA: Diagnosis not present

## 2021-09-17 DIAGNOSIS — R1319 Other dysphagia: Secondary | ICD-10-CM | POA: Diagnosis not present

## 2021-09-17 NOTE — Progress Notes (Signed)
? ? ?Referring Provider: Celene Squibb, MD ?Primary Care Physician:  Celene Squibb, MD ?Primary GI:  Dr. Abbey Chatters ? ?Chief Complaint  ?Patient presents with  ? Diarrhea  ?  Pt complains of diarrhea x 2 years. Has diarrhea 8 to 10 times a day. Imodium AD helps after days  ? ? ?HPI:   ?Chloe Golden is a 76 y.o. female who presents to clinic today for follow-up visit.  Chief complaint for me today is chronic diarrhea as well as fecal incontinence.  States she has had diarrhea for over a year now.  States she sometimes has 8-10 bowel movements a day.  Typically watery, sometimes mushy.  Has taken Imodium which helps some though she is worried to take this as it could constipate her.  Also with lower abdominal crampy pain.  Intermittent, mild in severity. ? ?She has had issues of incontinence in the past.  She underwent anorectal manometry in 2014 at [facility].  This revealed pelvic floor dyssynergia.  Was recommended she go to physical therapy for this. ? ?Recently seen by urology as well who again recommended pelvic floor PT.  Patient states she wants to have her diarrhea figured out first as she is worried that during her physical therapy session she will have episode of incontinence. ? ?Status post cholecystectomy. ? ?Duodenal biopsies in the past negative for celiac disease. ? ?Last colonoscopy 2014 unremarkable besides diverticulosis and hemorrhoids. ? ?No melena hematochezia. ? ?Also with chronic GERD and dysphagia. Dysphagia chronic, not improved status post esophageal dilation.  Esophageal dysmotility seen on previous esophagram. ? ?Past Medical History:  ?Diagnosis Date  ? Acid reflux   ? Arthritis   ? Degenerative arthritis of cervical spine   ? Fibromyalgia   ? Functional gastrointestinal disorder   ? GERD (gastroesophageal reflux disease)   ? Glaucoma   ? Hiatal hernia   ? Hypertension   ? Neuropathy   ? OSA on CPAP   ? PAD (peripheral artery disease) ([place])   ? Mild by arterial Dopplers April 2019  ? Rheumatoid  arthritis(714.0)   ? Venous reflux   ? ? ?Past Surgical History:  ?Procedure Laterality Date  ? ABDOMINAL HYSTERECTOMY    ? BACTERIAL OVERGROWTH TEST N/A 11/09/2012  ? Procedure: BACTERIAL OVERGROWTH TEST;  Surgeon: Danie Binder, MD;  [facility];  Service: Endoscopy;  Laterality: N/A;  7:30  ? BALLOON DILATION N/A 07/07/2021  ? Procedure: BALLOON DILATION;  Surgeon: Eloise Harman, DO;  [facility]: [facility];  Service: Endoscopy;  Laterality: N/A;  ? BIOPSY N/A 10/28/2012  ? Procedure: BIOPSY;  Surgeon: Danie Binder, MD;  [facility];  Service: Endoscopy;  Laterality: N/A;  DUODENAL BIOPSY  ? BIOPSY  07/07/2021  ? Procedure: BIOPSY;  Surgeon: Eloise Harman, DO;  [facility];  Service: Endoscopy;;  ? BLADDER SURGERY    ? CARPAL TUNNEL RELEASE Bilateral   ? CHOLECYSTECTOMY    ? COLONOSCOPY WITH ESOPHAGOGASTRODUODENOSCOPY (EGD) N/A 10/28/2012  ? HYI:FOYDXAJO diverticulosis in the descending colon and sigmoid colon/Small internal hemorrhoids  ? ESOPHAGOGASTRODUODENOSCOPY N/A 11/27/2016  ? Procedure: ESOPHAGOGASTRODUODENOSCOPY (EGD);  Surgeon: Danie Binder, MD;  [facility];  Service: Endoscopy;  Laterality: N/A;  830 ?  ? ESOPHAGOGASTRODUODENOSCOPY (EGD) WITH ESOPHAGEAL DILATION N/A 10/28/2012  ? INO:MVEHMCNO CERVIAL WEB, S/P DILATION/MILD Non-erosive gastritis & DUODENITIS  ? ESOPHAGOGASTRODUODENOSCOPY (EGD) WITH PROPOFOL N/A 07/07/2021  ? Procedure: ESOPHAGOGASTRODUODENOSCOPY (EGD) WITH PROPOFOL;  Surgeon: Eloise Harman,  DO;  [facility];  Service: Endoscopy;  Laterality: N/A;  9:30am  ? SAVORY DILATION N/A 11/27/2016  ? Procedure: SAVORY DILATION;  Surgeon: Danie Binder, MD;  [facility];  Service: Endoscopy;  Laterality: N/A;  ? TOTAL KNEE ARTHROPLASTY Right   ? ? ?Current Outpatient Medications  ?Medication Sig Dispense Refill  ? ACCU-CHEK GUIDE test strip See admin instructions.    ? ALPRAZolam (XANAX) 0.5 MG tablet Take  0.5-1 mg by mouth See admin instructions. 0.5 mg in the morning, 1 mg at bedtime    ? baclofen (LIORESAL) 10 MG tablet Take 10 mg by mouth 2 (two) times daily.    ? Coenzyme Q10 (COQ-10) 100 MG CAPS Take 100 mg by mouth See admin instructions. Twice a day on Monday, Wednesday and Friday and 1 capsule all other days.    ? conjugated estrogens (PREMARIN) vaginal cream Place 1 applicator vaginally See admin instructions. Twice a week as directed    ? donepezil (ARICEPT) 10 MG tablet Take 10 mg by mouth daily.    ? ezetimibe (ZETIA) 10 MG tablet Take 10 mg by mouth at bedtime.     ? furosemide (LASIX) 20 MG tablet Take 20 mg by mouth daily.    ? GEMTESA 75 MG TABS Take 75 mg by mouth daily.    ? hydroxychloroquine (PLAQUENIL) 200 MG tablet Take 400 mg by mouth daily.    ? JANUVIA 100 MG tablet Take 100 mg by mouth daily.    ? JARDIANCE 10 MG TABS tablet Take 10 mg by mouth daily.    ? losartan-hydrochlorothiazide (HYZAAR) 100-12.5 MG tablet Take 1 tablet by mouth daily.    ? LYRICA 50 MG capsule Take 50 mg by mouth 2 (two) times daily.    ? meclizine (ANTIVERT) 25 MG tablet Take 25 mg by mouth 3 (three) times daily as needed for dizziness.    ? nebivolol (BYSTOLIC) 5 MG tablet Take 5 mg by mouth at bedtime.    ? Omega-3 Fatty Acids (FISH OIL) 1000 MG CAPS Take 1,000 mg by mouth daily.    ? ondansetron (ZOFRAN) 4 MG tablet Take 1 tablet (4 mg total) by mouth every 6 (six) hours. 12 tablet 0  ? pantoprazole (PROTONIX) 40 MG tablet Take 1 tablet (40 mg total) by mouth 2 (two) times daily before a meal. 60 tablet 5  ? rOPINIRole (REQUIP) 0.5 MG tablet Take 1.5 mg by mouth at bedtime.    ? simvastatin (ZOCOR) 20 MG tablet Take 20 mg by mouth every Monday, Wednesday, and Friday.    ? traZODone (DESYREL) 100 MG tablet Take 100 mg by mouth at bedtime.    ? TURMERIC CURCUMIN PO Take 1,000 mg by mouth daily.    ? vortioxetine HBr (TRINTELLIX) 10 MG TABS tablet Take 10 mg by mouth daily.    ? Multiple Vitamins-Minerals  (MULTIVITAMIN WITH MINERALS) tablet Take 1 tablet by mouth daily.    ? predniSONE (DELTASONE) 5 MG tablet prednisone 5 mg tablet ? TAKE 6 TABLETS BY MOUTH DAILY FOR 3 DAYS, 5 TABS X3 DAYS, 4 TABS X3 DAYS, 3 TABS X3 DAYS, 2 TABS X3 DAYS, 1 TAB X3 DAYS (Patient not taking: Reported on 09/17/2021)    ? ?No current facility-administered medications for this visit.  ? ? ?Allergies as of 09/17/2021 - Review Complete 09/17/2021  ?Allergen Reaction Noted  ? Codeine Other (See Comments) 09/26/2012  ? Dicyclomine  01/05/2013  ? Ibuprofen Other (See Comments) 09/26/2012  ?  Sulfa antibiotics  11/24/2016  ? Valium [diazepam] Other (See Comments) 09/26/2012  ? ? ?Family History  ?Problem Relation Age of Onset  ? Lung cancer Father   ? Diabetes Mellitus II Father   ? Crohn's disease Cousin   ? Hypertension Mother   ? Cystic fibrosis Mother   ? Lung cancer Brother   ? Hypertension Brother   ? Lupus Niece   ? Colon cancer Neg Hx   ? Celiac disease Neg Hx   ? Gastric cancer Neg Hx   ? Esophageal cancer Neg Hx   ? ? ?Social History  ? ?Socioeconomic History  ? Marital status: Single  ?  Spouse name: Not on file  ? Number of children: Not on file  ? Years of education: Not on file  ? Highest education level: Not on file  ?Occupational History  ? Not on file  ?Tobacco Use  ? Smoking status: Never  ? Smokeless tobacco: Never  ?Vaping Use  ? Vaping Use: Never used  ?Substance and Sexual Activity  ? Alcohol use: No  ? Drug use: No  ? Sexual activity: Yes  ?Other Topics Concern  ? Not on file  ?Social History Narrative  ? 1 living  ? 2 deceased  ? ?Social Determinants of Health  ? ?Financial Resource Strain: Not on file  ?Food Insecurity: Not on file  ?Transportation Needs: Not on file  ?Physical Activity: Not on file  ?Stress: Not on file  ?Social Connections: Not on file  ? ? ?Subjective: ?Review of Systems  ?Constitutional:  Negative for chills and fever.  ?HENT:  Negative for congestion and hearing loss.   ?Eyes:  Negative for blurred  vision and double vision.  ?Respiratory:  Negative for cough and shortness of breath.   ?Cardiovascular:  Negative for chest pain and palpitations.  ?Gastrointestinal:  Positive for diarrhea. Negative for abdominal pa

## 2021-09-17 NOTE — Patient Instructions (Signed)
We will schedule you for colonoscopy to further evaluate your chronic diarrhea. ? ?I want you to start taking Imodium scheduled 3 times a day. ? ?I am also going to order stool studies to be performed at [facility].  You will need to drop the sample off.  They will not run these tests if it is a formed stool. ? ?Follow-up after colonoscopy. ? ?It was very nice seeing you again today. ? ?Dr. Abbey Chatters ? ?At [facility] we value your feedback. You may receive a survey about your visit today. Please share your experience as we strive to create trusting relationships with our patients to provide genuine, compassionate, quality care. ? ?We appreciate your understanding and patience as we review any laboratory studies, imaging, and other diagnostic tests that are ordered as we care for you. Our office policy is 5 business days for review of these results, and any emergent or urgent results are addressed in a timely manner for your best interest. If you do not hear from our office in 1 week, please contact us.  ? ?We also encourage the use of MyChart, which contains your medical information for your review as well. If you are not enrolled in this feature, an access code is on this after visit summary for your convenience. Thank you for allowing [place] to be involved in your care. ? ?It was great to see you today!  I hope you have a great rest of your Winter! ? ? ? ?Elon Alas. Abbey Chatters, D.O. ?Gastroenterology and Hepatology ?[facility] ? ?

## 2021-09-18 DIAGNOSIS — Z79899 Other long term (current) drug therapy: Secondary | ICD-10-CM | POA: Diagnosis not present

## 2021-09-18 DIAGNOSIS — R197 Diarrhea, unspecified: Secondary | ICD-10-CM | POA: Diagnosis not present

## 2021-09-19 DIAGNOSIS — I10 Essential (primary) hypertension: Secondary | ICD-10-CM | POA: Diagnosis not present

## 2021-09-19 DIAGNOSIS — Z0100 Encounter for examination of eyes and vision without abnormal findings: Secondary | ICD-10-CM | POA: Diagnosis not present

## 2021-09-19 DIAGNOSIS — H524 Presbyopia: Secondary | ICD-10-CM | POA: Diagnosis not present

## 2021-09-19 DIAGNOSIS — E785 Hyperlipidemia, unspecified: Secondary | ICD-10-CM | POA: Diagnosis not present

## 2021-09-23 DIAGNOSIS — N3944 Nocturnal enuresis: Secondary | ICD-10-CM | POA: Diagnosis not present

## 2021-09-23 DIAGNOSIS — R350 Frequency of micturition: Secondary | ICD-10-CM | POA: Diagnosis not present

## 2021-09-23 DIAGNOSIS — N3946 Mixed incontinence: Secondary | ICD-10-CM | POA: Diagnosis not present

## 2021-09-23 LAB — GI PROFILE, STOOL, PCR

## 2021-09-23 LAB — PANCREATIC ELASTASE, FECAL: Pancreatic Elastase, Fecal: >500 ug Elast./g (ref >200)

## 2021-09-24 ENCOUNTER — Other Ambulatory Visit: Payer: Self-pay

## 2021-09-24 ENCOUNTER — Telehealth: Payer: Self-pay

## 2021-09-24 MED ORDER — PEG 3350-KCL-NA BICARB-NACL 420 G PO SOLR: 4000.0000 mL | ORAL | 0 refills | Status: DC

## 2021-09-24 NOTE — Telephone Encounter (Signed)
Pre-op appt 10/16/21. Appt letter mailed with procedure instructions. ?

## 2021-09-24 NOTE — Telephone Encounter (Signed)
Called pt, [facility]CS ASA 3 w/Dr. Abbey Chatters scheduled for 10/20/21 at 7:30am. Rx for prep sent to pharmacy. Orders entered. ?

## 2021-10-15 DIAGNOSIS — M7062 Trochanteric bursitis, left hip: Secondary | ICD-10-CM | POA: Diagnosis not present

## 2021-10-15 DIAGNOSIS — M545 Low back pain, unspecified: Secondary | ICD-10-CM | POA: Diagnosis not present

## 2021-10-15 DIAGNOSIS — G8929 Other chronic pain: Secondary | ICD-10-CM | POA: Diagnosis not present

## 2021-10-15 DIAGNOSIS — M25552 Pain in left hip: Secondary | ICD-10-CM | POA: Diagnosis not present

## 2021-10-15 NOTE — Patient Instructions (Signed)
? ? ? ? ? ? Chloe Golden ? 10/15/2021  ?  ? '@PREFPERIOPPHARMACY'$ @ ? ? Your procedure is scheduled on  10/20/2021. ? ? Report to [facility] at  0600  A.M. ? ? Call this number if you have problems the morning of surgery: ? [phone] ? ? Remember: ? Follow the diet and prep instructions given to you by the office. ? ?   DO NOT take any medications for diabetes the morning of your procedure. ?  ? Take these medicines the morning of surgery with A SIP OF WATER  ? ?       xanax(if needed), amlodipine, baclofen, aricept, arava, lyrica, antivert(if needed), bystolic, zofran(if needed), protonix, trintellix. ?  ? ? Do not wear jewelry, make-up or nail polish. ? Do not wear lotions, powders, or perfumes, or deodorant. ? Do not shave 48 hours prior to surgery.  Men may shave face and neck. ? Do not bring valuables to the hospital. ? [facility] is not responsible for any belongings or valuables. ? ?Contacts, dentures or bridgework may not be worn into surgery.  Leave your suitcase in the car.  After surgery it may be brought to your room. ? ?For patients admitted to the hospital, discharge time will be determined by your treatment team. ? ?Patients discharged the day of surgery will not be allowed to drive home and must have someone with them for 24 hours.  ? ? ?Special instructions:   DO NOT smoke tobacco or vape for 24 hours before your procedure. ? ?Please read over the following fact sheets that you were given. ?Anesthesia Post-op Instructions and Care and Recovery After Surgery ?  ? ? ? Colonoscopy, Adult, Care After ?The following information offers guidance on how to care for yourself after your procedure. Your health care provider may also give you more specific instructions. If you have problems or questions, contact your health care provider. ?What can I expect after the procedure? ?After the procedure, it is common to have: ?A small amount of blood in your stool for 24 hours after the procedure. ?Some gas. ?Mild  cramping or bloating of your abdomen. ?Follow these instructions at home: ?Eating and drinking ? ?Drink enough fluid to keep your urine pale yellow. ?Follow instructions from your health care provider about eating or drinking restrictions. ?Resume your normal diet as told by your health care provider. Avoid heavy or fried foods that are hard to digest. ?Activity ?Rest as told by your health care provider. ?Avoid sitting for a long time without moving. Get up to take short walks every 1-2 hours. This is important to improve blood flow and breathing. Ask for help if you feel weak or unsteady. ?Return to your normal activities as told by your health care provider. Ask your health care provider what activities are safe for you. ?Managing cramping and bloating ? ?Try walking around when you have cramps or feel bloated. ?If directed, apply heat to your abdomen as told by your health care provider. Use the heat source that your health care provider recommends, such as a moist heat pack or a heating pad. ?Place a towel between your skin and the heat source. ?Leave the heat on for 20-30 minutes. ?Remove the heat if your skin turns bright red. This is especially important if you are unable to feel pain, heat, or cold. You have a greater risk of getting burned. ?General instructions ?If you were given a sedative during the procedure, it can affect you for  several hours. Do not drive or operate machinery until your health care provider says that it is safe. ?For the first 24 hours after the procedure: ?Do not sign important documents. ?Do not drink alcohol. ?Do your regular daily activities at a slower pace than normal. ?Eat soft foods that are easy to digest. ?Take over-the-counter and prescription medicines only as told by your health care provider. ?Keep all follow-up visits. This is important. ?Contact a health care provider if: ?You have blood in your stool 2-3 days after the procedure. ?Get help right away if: ?You have  more than a small spotting of blood in your stool. ?You have large blood clots in your stool. ?You have swelling of your abdomen. ?You have nausea or vomiting. ?You have a fever. ?You have increasing pain in your abdomen that is not relieved with medicine. ?These symptoms may be an emergency. Get help right away. Call 911. ?Do not wait to see if the symptoms will go away. ?Do not drive yourself to the hospital. ?Summary ?After the procedure, it is common to have a small amount of blood in your stool. You may also have mild cramping and bloating of your abdomen. ?If you were given a sedative during the procedure, it can affect you for several hours. Do not drive or operate machinery until your health care provider says that it is safe. ?Get help right away if you have a lot of blood in your stool, nausea or vomiting, a fever, or increased pain in your abdomen. ?This information is not intended to replace advice given to you by your health care provider. Make sure you discuss any questions you have with your health care provider. ?Document Revised: 01/29/2021 Document Reviewed: 01/29/2021 ?Elsevier Patient Education ? [place]. ?Monitored Anesthesia Care, Care After ?This sheet gives you information about how to care for yourself after your procedure. Your health care provider may also give you more specific instructions. If you have problems or questions, contact your health care provider. ?What can I expect after the procedure? ?After the procedure, it is common to have: ?Tiredness. ?Forgetfulness about what happened after the procedure. ?Impaired judgment for important decisions. ?Nausea or vomiting. ?Some difficulty with balance. ?Follow these instructions at home: ?For the time period you were told by your health care provider: ? ?  ? ?Rest as needed. ?Do not participate in activities where you could fall or become injured. ?Do not drive or use machinery. ?Do not drink alcohol. ?Do not take sleeping  pills or medicines that cause drowsiness. ?Do not make important decisions or sign legal documents. ?Do not take care of children on your own. ?Eating and drinking ?Follow the diet that is recommended by your health care provider. ?Drink enough fluid to keep your urine pale yellow. ?If you vomit: ?Drink water, juice, or soup when you can drink without vomiting. ?Make sure you have little or no nausea before eating solid foods. ?General instructions ?Have a responsible adult stay with you for the time you are told. It is important to have someone help care for you until you are awake and alert. ?Take over-the-counter and prescription medicines only as told by your health care provider. ?If you have sleep apnea, surgery and certain medicines can increase your risk for breathing problems. Follow instructions from your health care provider about wearing your sleep device: ?Anytime you are sleeping, including during daytime naps. ?While taking prescription pain medicines, sleeping medicines, or medicines that make you drowsy. ?Avoid smoking. ?Keep all follow-up  visits as told by your health care provider. This is important. ?Contact a health care provider if: ?You keep feeling nauseous or you keep vomiting. ?You feel light-headed. ?You are still sleepy or having trouble with balance after 24 hours. ?You develop a rash. ?You have a fever. ?You have redness or swelling around the IV site. ?Get help right away if: ?You have trouble breathing. ?You have new-onset confusion at home. ?Summary ?For several hours after your procedure, you may feel tired. You may also be forgetful and have poor judgment. ?Have a responsible adult stay with you for the time you are told. It is important to have someone help care for you until you are awake and alert. ?Rest as told. Do not drive or operate machinery. Do not drink alcohol or take sleeping pills. ?Get help right away if you have trouble breathing, or if you suddenly become  confused. ?This information is not intended to replace advice given to you by your health care provider. Make sure you discuss any questions you have with your health care provider. ?Document Revised: 05/13/2021 Doc

## 2021-10-16 ENCOUNTER — Encounter (HOSPITAL_COMMUNITY)
Admission: RE | Admit: 2021-10-16 | Discharge: 2021-10-16 | Disposition: A | Discharge status: Home / Self Care | Payer: Medicare HMO | Source: Ambulatory Visit | Attending: Internal Medicine | Admitting: Internal Medicine

## 2021-10-16 ENCOUNTER — Encounter (HOSPITAL_COMMUNITY): Payer: Self-pay

## 2021-10-16 VITALS — BP 138/62 | HR 67 | Temp 97.8°F | Resp 18 | Ht 64.0 in | Wt 196.0 lb

## 2021-10-16 DIAGNOSIS — Z01812 Encounter for preprocedural laboratory examination: Secondary | ICD-10-CM | POA: Insufficient documentation

## 2021-10-16 DIAGNOSIS — E1143 Type 2 diabetes mellitus with diabetic autonomic (poly)neuropathy: Secondary | ICD-10-CM | POA: Insufficient documentation

## 2021-10-16 LAB — BASIC METABOLIC PANEL
Anion gap: 7 (ref 5–15)
BUN: 20 mg/dL (ref 8–23)
CO2: 30 mmol/L (ref 22–32)
Calcium: 9.3 mg/dL (ref 8.9–10.3)
Chloride: 103 mmol/L (ref 98–111)
Creatinine, Ser: 1.15 mg/dL — ABNORMAL HIGH (ref 0.44–1.00)
GFR, Estimated: 50 mL/min — ABNORMAL LOW (ref >60)
Glucose, Bld: 107 mg/dL — ABNORMAL HIGH (ref 70–99)
Potassium: 3.9 mmol/L (ref 3.5–5.1)
Sodium: 140 mmol/L (ref 135–145)

## 2021-10-20 ENCOUNTER — Encounter (HOSPITAL_COMMUNITY): Payer: Self-pay

## 2021-10-20 ENCOUNTER — Ambulatory Visit (HOSPITAL_COMMUNITY): Payer: Medicare HMO | Admitting: Certified Registered Nurse Anesthetist

## 2021-10-20 ENCOUNTER — Encounter (HOSPITAL_COMMUNITY)
Admission: RE | Disposition: A | Discharge status: Home / Self Care | Payer: Self-pay | Source: Home / Self Care | Attending: Internal Medicine

## 2021-10-20 ENCOUNTER — Ambulatory Visit (HOSPITAL_BASED_OUTPATIENT_CLINIC_OR_DEPARTMENT_OTHER): Payer: Medicare HMO | Admitting: Certified Registered Nurse Anesthetist

## 2021-10-20 ENCOUNTER — Ambulatory Visit (HOSPITAL_COMMUNITY)
Admission: RE | Admit: 2021-10-20 | Discharge: 2021-10-20 | Disposition: A | Discharge status: Home / Self Care | Payer: Medicare HMO | Attending: Internal Medicine | Admitting: Internal Medicine

## 2021-10-20 DIAGNOSIS — K529 Noninfective gastroenteritis and colitis, unspecified: Secondary | ICD-10-CM | POA: Diagnosis not present

## 2021-10-20 DIAGNOSIS — G4733 Obstructive sleep apnea (adult) (pediatric): Secondary | ICD-10-CM | POA: Diagnosis not present

## 2021-10-20 DIAGNOSIS — E119 Type 2 diabetes mellitus without complications: Secondary | ICD-10-CM | POA: Insufficient documentation

## 2021-10-20 DIAGNOSIS — M1990 Unspecified osteoarthritis, unspecified site: Secondary | ICD-10-CM | POA: Insufficient documentation

## 2021-10-20 DIAGNOSIS — K219 Gastro-esophageal reflux disease without esophagitis: Secondary | ICD-10-CM | POA: Diagnosis not present

## 2021-10-20 DIAGNOSIS — E669 Obesity, unspecified: Secondary | ICD-10-CM | POA: Diagnosis not present

## 2021-10-20 DIAGNOSIS — M797 Fibromyalgia: Secondary | ICD-10-CM | POA: Diagnosis not present

## 2021-10-20 DIAGNOSIS — D122 Benign neoplasm of ascending colon: Secondary | ICD-10-CM

## 2021-10-20 DIAGNOSIS — G709 Myoneural disorder, unspecified: Secondary | ICD-10-CM | POA: Diagnosis not present

## 2021-10-20 DIAGNOSIS — I739 Peripheral vascular disease, unspecified: Secondary | ICD-10-CM | POA: Diagnosis not present

## 2021-10-20 DIAGNOSIS — K648 Other hemorrhoids: Secondary | ICD-10-CM | POA: Insufficient documentation

## 2021-10-20 DIAGNOSIS — K635 Polyp of colon: Secondary | ICD-10-CM | POA: Diagnosis not present

## 2021-10-20 DIAGNOSIS — D123 Benign neoplasm of transverse colon: Secondary | ICD-10-CM

## 2021-10-20 DIAGNOSIS — D120 Benign neoplasm of cecum: Secondary | ICD-10-CM

## 2021-10-20 DIAGNOSIS — K5730 Diverticulosis of large intestine without perforation or abscess without bleeding: Secondary | ICD-10-CM

## 2021-10-20 DIAGNOSIS — I10 Essential (primary) hypertension: Secondary | ICD-10-CM | POA: Diagnosis not present

## 2021-10-20 DIAGNOSIS — K449 Diaphragmatic hernia without obstruction or gangrene: Secondary | ICD-10-CM | POA: Diagnosis not present

## 2021-10-20 HISTORY — PX: POLYPECTOMY: SHX5525

## 2021-10-20 HISTORY — PX: COLONOSCOPY WITH PROPOFOL: SHX5780

## 2021-10-20 HISTORY — PX: BIOPSY: SHX5522

## 2021-10-20 LAB — GLUCOSE, CAPILLARY: Glucose-Capillary: 108 mg/dL — ABNORMAL HIGH (ref 70–99)

## 2021-10-20 SURGERY — COLONOSCOPY WITH PROPOFOL
Anesthesia: General | Laterality: Left

## 2021-10-20 MED ORDER — LACTATED RINGERS IV SOLN: INTRAVENOUS | Status: DC

## 2021-10-20 MED ORDER — LIDOCAINE HCL (CARDIAC) PF 100 MG/5ML IV SOSY
PREFILLED_SYRINGE | INTRAVENOUS | Status: DC | PRN
  Administered 2021-10-20: 50 mg via INTRAVENOUS

## 2021-10-20 MED ORDER — EPHEDRINE SULFATE (PRESSORS) 50 MG/ML IJ SOLN
INTRAMUSCULAR | Status: DC | PRN
  Administered 2021-10-20 (×2): 5 mg via INTRAVENOUS
  Administered 2021-10-20: 10 mg via INTRAVENOUS

## 2021-10-20 MED ORDER — PROPOFOL 10 MG/ML IV BOLUS
INTRAVENOUS | Status: DC | PRN
  Administered 2021-10-20: 30 mg via INTRAVENOUS
  Administered 2021-10-20: 20 mg via INTRAVENOUS
  Administered 2021-10-20: 100 mg via INTRAVENOUS
  Administered 2021-10-20: 50 mg via INTRAVENOUS
  Administered 2021-10-20: 20 mg via INTRAVENOUS

## 2021-10-20 NOTE — Transfer of Care (Signed)
Immediate Anesthesia Transfer of Care Note ? ?Patient: Chloe Golden ? ?Procedure(s) Performed: COLONOSCOPY WITH PROPOFOL (Left) ?POLYPECTOMY ?BIOPSY ? ?Patient Location: Short Stay ? ?Anesthesia Type:General ? ?Level of Consciousness: awake ? ?Airway & Oxygen Therapy: Patient Spontanous Breathing ? ?Post-op Assessment: Report given to RN and Post -op Vital signs reviewed and stable ? ?Post vital signs: Reviewed and stable ? ?Last Vitals:  ?Vitals Value Taken Time  ?BP 100/54 10/20/21 0805  ?Temp 36.7 ?C 10/20/21 0805  ?Pulse 74 10/20/21 0805  ?Resp 15 10/20/21 0805  ?SpO2 92 % 10/20/21 0805  ? ? ?Last Pain:  ?Vitals:  ? 10/20/21 0805  ?TempSrc: Oral  ?PainSc: 0-No pain  ?   ? ?  ? ?Complications: No notable events documented. ?

## 2021-10-20 NOTE — Op Note (Signed)
[facility] ?Patient Name: Chloe Golden ?Procedure Date: 10/20/2021 7:08 AM ?MRN: [id number] ?Date of Birth: [birth year] ?Attending MD: Elon Alas. Abbey Chatters , DO ?CSN: [id number] ?Age: 76 ?Admit Type: Outpatient ?Procedure:                Colonoscopy ?Indications:              Chronic diarrhea ?Providers:                Elon Alas. Abbey Chatters, DO, Tammy Vaught, RN, Crisann  ?                          Wynonia Lawman, Technician ?Referring MD:              ?Medicines:                See the Anesthesia note for documentation of the  ?                          administered medications ?Complications:            No immediate complications. ?Estimated Blood Loss:     Estimated blood loss was minimal. ?Procedure:                Pre-Anesthesia Assessment: ?                          - The anesthesia plan was to use monitored  ?                          anesthesia care (MAC). ?                          After obtaining informed consent, the colonoscope  ?                          was passed under direct vision. Throughout the  ?                          procedure, the patient's blood pressure, pulse, and  ?                          oxygen saturations were monitored continuously. The  ?                          PCF-HQ190L ([id number]) scope was introduced through  ?                          the anus and advanced to the the cecum, identified  ?                          by appendiceal orifice and ileocecal valve. The  ?                          colonoscopy was performed without difficulty. The  ?                          patient tolerated the procedure well. The quality  ?  of the bowel preparation was evaluated using the  ?                          BBPS [facility] Bowel Preparation Scale) with scores  ?                          of: Right Colon = 3, Transverse Colon = 3 and Left  ?                          Colon = 3 (entire mucosa seen well with no residual  ?                          staining, small fragments of stool or  opaque  ?                          liquid). The total BBPS score equals 9. ?Scope In: 7:39:06 AM ?Scope Out: 7:58:43 AM ?Scope Withdrawal Time: 0 hours 16 minutes 44 seconds  ?Total Procedure Duration: 0 hours 19 minutes 37 seconds  ?Findings: ?     The perianal and digital rectal examinations were normal. ?     Non-bleeding internal hemorrhoids were found during retroflexion. ?     Multiple medium-mouthed diverticula were found in the sigmoid colon. ?     Four sessile polyps were found in the ascending colon and cecum. The  ?     polyps were 4 to 8 mm in size. These polyps were removed with a cold  ?     snare. Resection and retrieval were complete. ?     A 2 mm polyp was found in the ascending colon. The polyp was sessile.  ?     The polyp was removed with a cold biopsy forceps. Resection and  ?     retrieval were complete. ?     Two sessile polyps were found in the transverse colon. The polyps were 4  ?     to 6 mm in size. These polyps were removed with a cold snare. Resection  ?     and retrieval were complete. ?     Biopsies for histology were taken with a cold forceps from the ascending  ?     colon, transverse colon and descending colon for evaluation of  ?     microscopic colitis. ?Impression:               - Non-bleeding internal hemorrhoids. ?                          - Diverticulosis in the sigmoid colon. ?                          - Four 4 to 8 mm polyps in the ascending colon and  ?                          in the cecum, removed with a cold snare. Resected  ?                          and retrieved. ?                          -  One 2 mm polyp in the ascending colon, removed  ?                          with a cold biopsy forceps. Resected and retrieved. ?                          - Two 4 to 6 mm polyps in the transverse colon,  ?                          removed with a cold snare. Resected and retrieved. ?                          - Biopsies were taken with a cold forceps from the  ?                           ascending colon, transverse colon and descending  ?                          colon for evaluation of microscopic colitis. ?Moderate Sedation: ?     Per Anesthesia Care ?Recommendation:           - Patient has a contact number available for  ?                          emergencies. The signs and symptoms of potential  ?                          delayed complications were discussed with the  ?                          patient. Return to normal activities tomorrow.  ?                          Written discharge instructions were provided to the  ?                          patient. ?                          - Resume previous diet. ?                          - Continue present medications. ?                          - Await pathology results. ?                          - Repeat colonoscopy in 3 years for surveillance. ?                          - Return to GI clinic in 3 months. ?Procedure Code(s):        --- Professional --- ?  45385, Colonoscopy, flexible; with removal of  ?                          tumor(s), polyp(s), or other lesion(s) by snare  ?                          technique ?                          45380, 59, Colonoscopy, flexible; with biopsy,  ?                          single or multiple ?Diagnosis Code(s):        --- Professional --- ?                          A83.4, Other hemorrhoids ?                          K63.5, Polyp of colon ?                          K52.9, Noninfective gastroenteritis and colitis,  ?                          unspecified ?                          K57.30, Diverticulosis of large intestine without  ?                          perforation or abscess without bleeding ?CPT copyright 2019 American Medical Association. All rights reserved. ?The codes documented in this report are preliminary and upon coder review may  ?be revised to meet current compliance requirements. ?Elon Alas. Abbey Chatters, DO ?Elon Alas. [place], DO ?10/20/2021 8:05:28 AM ?This report has been  signed electronically. ?Number of Addenda: 0 ?

## 2021-10-20 NOTE — Anesthesia Preprocedure Evaluation (Signed)
Anesthesia Evaluation  ?Patient identified by MRN, date of birth, ID band ?Patient awake ? ? ? ?Reviewed: ?Allergy & Precautions, NPO status , Patient's Chart, lab work & pertinent test results ? ?Airway ?Mallampati: III ? ? ? ? ? ? Dental ?no notable dental hx. ?(+) Teeth Intact, Dental Advisory Given ?  ?Pulmonary ?sleep apnea ,  ?  ?Pulmonary exam normal ? ? ? ? ? ? ? Cardiovascular ?hypertension, + Peripheral Vascular Disease  ?Normal cardiovascular exam ? ? ?  ?Neuro/Psych ? Neuromuscular disease   ? GI/Hepatic ?Neg liver ROS, hiatal hernia, GERD  ,  ?Endo/Other  ?negative endocrine ROS ? Renal/GU ?negative Renal ROS  ? ?  ?Musculoskeletal ? ?(+) Arthritis , Fibromyalgia - ? Abdominal ?(+) + obese,   ?Peds ? Hematology ?negative hematology ROS ?(+)   ?Anesthesia Other Findings ? ? Reproductive/Obstetrics ? ?  ? ? ? ? ? ? ? ? ? ? ? ? ? ?  ?  ? ? ? ? ? ? ? ? ?Anesthesia Physical ? ?Anesthesia Plan ? ?ASA: 2 ? ?Anesthesia Plan: General  ? ?Post-op Pain Management:   ? ?Induction: Intravenous ? ?PONV Risk Score and Plan: TIVA ? ?Airway Management Planned: Nasal Cannula ? ?Additional Equipment:  ? ?Intra-op Plan:  ? ?Post-operative Plan:  ? ?Informed Consent: I have reviewed the patients History and Physical, chart, labs and discussed the procedure including the risks, benefits and alternatives for the proposed anesthesia with the patient or authorized representative who has indicated his/her understanding and acceptance.  ? ? ? ? ? ?Plan Discussed with: CRNA ? ?Anesthesia Plan Comments:   ? ? ? ? ? ? ?Anesthesia Quick Evaluation ? ?

## 2021-10-20 NOTE — H&P (Signed)
Primary Care Physician:  Celene Squibb, MD ?Primary Gastroenterologist:  Dr. Abbey Chatters ? ?Pre-Procedure History & Physical: ?HPI:  Chloe Golden is a 76 y.o. female is here for a colonoscopy to be performed for chronic diarrhea and stool incontinence.  ? ?Past Medical History:  ?Diagnosis Date  ? Acid reflux   ? Arthritis   ? Degenerative arthritis of cervical spine   ? Fibromyalgia   ? Functional gastrointestinal disorder   ? GERD (gastroesophageal reflux disease)   ? Glaucoma   ? Hiatal hernia   ? Hypertension   ? Neuropathy   ? OSA on CPAP   ? PAD (peripheral artery disease) ([place])   ? Mild by arterial Dopplers April 2019  ? Rheumatoid arthritis(714.0)   ? Venous reflux   ? ? ?Past Surgical History:  ?Procedure Laterality Date  ? ABDOMINAL HYSTERECTOMY    ? BACTERIAL OVERGROWTH TEST N/A 11/09/2012  ? Procedure: BACTERIAL OVERGROWTH TEST;  Surgeon: Danie Binder, MD;  [facility];  Service: Endoscopy;  Laterality: N/A;  7:30  ? BALLOON DILATION N/A 07/07/2021  ? Procedure: BALLOON DILATION;  Surgeon: Eloise Harman, DO;  [facility];  Service: Endoscopy;  Laterality: N/A;  ? BIOPSY N/A 10/28/2012  ? Procedure: BIOPSY;  Surgeon: Danie Binder, MD;  [facility];  Service: Endoscopy;  Laterality: N/A;  DUODENAL BIOPSY  ? BIOPSY  07/07/2021  ? Procedure: BIOPSY;  Surgeon: Eloise Harman, DO;  [facility];  Service: Endoscopy;;  ? BLADDER SURGERY    ? CARPAL TUNNEL RELEASE Bilateral   ? CHOLECYSTECTOMY    ? COLONOSCOPY WITH ESOPHAGOGASTRODUODENOSCOPY (EGD) N/A 10/28/2012  ? SEG:BTDVVOHY diverticulosis in the descending colon and sigmoid colon/Small internal hemorrhoids  ? ESOPHAGOGASTRODUODENOSCOPY N/A 11/27/2016  ? Procedure: ESOPHAGOGASTRODUODENOSCOPY (EGD);  Surgeon: Danie Binder, MD;  [facility];  Service: Endoscopy;  Laterality: N/A;  830 ?  ? ESOPHAGOGASTRODUODENOSCOPY (EGD) WITH ESOPHAGEAL DILATION N/A 10/28/2012  ? WVP:XTGGYIRS CERVIAL WEB, S/P  DILATION/MILD Non-erosive gastritis & DUODENITIS  ? ESOPHAGOGASTRODUODENOSCOPY (EGD) WITH PROPOFOL N/A 07/07/2021  ? Procedure: ESOPHAGOGASTRODUODENOSCOPY (EGD) WITH PROPOFOL;  Surgeon: Eloise Harman, DO;  [facility];  Service: Endoscopy;  Laterality: N/A;  9:30am  ? SAVORY DILATION N/A 11/27/2016  ? Procedure: SAVORY DILATION;  Surgeon: Danie Binder, MD;  [facility];  Service: Endoscopy;  Laterality: N/A;  ? TOTAL KNEE ARTHROPLASTY Right   ? ? ?Prior to Admission medications   ?Medication Sig Start Date End Date Taking? Authorizing Provider  ?ALPRAZolam (XANAX) 0.5 MG tablet Take 0.5-1 mg by mouth See admin instructions. 0.5 mg in the morning, 1 mg at bedtime   Yes [provider]  ?amLODipine (NORVASC) 5 MG tablet Take 5 mg by mouth daily. 09/20/21  Yes [provider]  ?baclofen (LIORESAL) 10 MG tablet Take 10 mg by mouth 2 (two) times daily. 05/04/20  Yes [provider]  ?Coenzyme Q10 (COQ-10) 100 MG CAPS Take 100 mg by mouth See admin instructions. Twice a day on Monday, Wednesday and Friday and 1 capsule all other days.   Yes [provider]  ?conjugated estrogens (PREMARIN) vaginal cream Place 1 applicator vaginally See admin instructions. Twice a week as directed   Yes [provider]  ?donepezil (ARICEPT) 10 MG tablet Take 10 mg by mouth daily. 07/24/20  Yes [provider]  ?ezetimibe (ZETIA) 10 MG tablet Take 10 mg by mouth at bedtime.  10/20/16  Yes  [provider]  ?furosemide (LASIX) 20 MG tablet Take 20 mg by mouth daily. 05/04/20  Yes [provider]  ?GEMTESA 75 MG TABS Take 75 mg by mouth daily. 01/01/21  Yes [provider]  ?hydroxychloroquine (PLAQUENIL) 200 MG tablet Take 400 mg by mouth daily. 01/02/21  Yes [provider]  ?JANUVIA 100 MG tablet Take 100 mg by mouth daily. 05/04/20  Yes [provider]  ?JARDIANCE 10 MG TABS tablet Take 10 mg by mouth daily. 05/03/21   Yes [provider]  ?leflunomide (ARAVA) 10 MG tablet Take 10 mg by mouth daily. 09/22/21  Yes [provider]  ?losartan-hydrochlorothiazide (HYZAAR) 100-12.5 MG tablet Take 1 tablet by mouth daily.   Yes [provider]  ?LYRICA 50 MG capsule Take 50 mg by mouth 2 (two) times daily. 04/15/17  Yes [provider]  ?meclizine (ANTIVERT) 25 MG tablet Take 25 mg by mouth 3 (three) times daily as needed for dizziness.   Yes [provider]  ?Multiple Vitamins-Minerals (MULTIVITAMIN WITH MINERALS) tablet Take 1 tablet by mouth daily.   Yes [provider]  ?nebivolol (BYSTOLIC) 5 MG tablet Take 5 mg by mouth at bedtime.   Yes [provider]  ?Omega-3 Fatty Acids (FISH OIL) 1000 MG CAPS Take 1,000 mg by mouth daily.   Yes [provider]  ?ondansetron (ZOFRAN) 4 MG tablet Take 1 tablet (4 mg total) by mouth every 6 (six) hours. ?Patient taking differently: Take 4 mg by mouth every 6 (six) hours as needed for nausea or vomiting. 05/13/21  Yes Azucena Cecil, PA-C  ?pantoprazole (PROTONIX) 40 MG tablet Take 1 tablet (40 mg total) by mouth 2 (two) times daily before a meal. 05/22/21 11/18/21 Yes Jini Horiuchi, Elon Alas, DO  ?potassium chloride (KLOR-CON) 10 MEQ tablet Take 10 mEq by mouth daily. 09/20/21  Yes [provider]  ?rOPINIRole (REQUIP) 0.5 MG tablet Take 1.5 mg by mouth at bedtime. 05/04/20  Yes [provider]  ?simvastatin (ZOCOR) 20 MG tablet Take 20 mg by mouth every Monday, Wednesday, and Friday. 05/03/21  Yes [provider]  ?solifenacin (VESICARE) 5 MG tablet Take 5 mg by mouth daily. 09/23/21  Yes [provider]  ?traZODone (DESYREL) 100 MG tablet Take 100 mg by mouth at bedtime. 04/15/17  Yes [provider]  ?TURMERIC CURCUMIN PO Take 1,000 mg by mouth daily.   Yes [provider]  ?vortioxetine HBr (TRINTELLIX) 10 MG TABS tablet Take 10 mg by mouth daily.   Yes [provider]  ?ACCU-CHEK GUIDE test strip See admin instructions. 12/12/20   [provider]  ?polyethylene glycol-electrolytes (TRILYTE) 420 g solution Take 4,000 mLs by mouth as directed. 09/24/21   Eloise Harman, DO  ? ? ?Allergies as of 09/24/2021 - Review Complete 09/17/2021  ?Allergen Reaction Noted  ? Codeine Other (See Comments) 09/26/2012  ? Dicyclomine  01/05/2013  ? Ibuprofen Other (See Comments) 09/26/2012  ? Sulfa antibiotics  11/24/2016  ? Valium [diazepam] Other (See Comments) 09/26/2012  ? ? ?Family History  ?Problem Relation Age of Onset  ? Lung cancer Father   ? Diabetes Mellitus II Father   ? Crohn's disease Cousin   ? Hypertension Mother   ? Cystic fibrosis Mother   ? Lung cancer Brother   ? Hypertension Brother   ? Lupus Niece   ? Colon cancer Neg Hx   ? Celiac disease Neg Hx   ? Gastric cancer Neg Hx   ?  Esophageal cancer Neg Hx   ? ? ?Social History  ? ?Socioeconomic History  ? Marital status: Single  ?  Spouse name: Not on file  ? Number of children: Not on file  ? Years of education: Not on file  ? Highest education level: Not on file  ?Occupational History  ? Not on file  ?Tobacco Use  ? Smoking status: Never  ? Smokeless tobacco: Never  ?Vaping Use  ? Vaping Use: Never used  ?Substance and Sexual Activity  ? Alcohol use: No  ? Drug use: No  ? Sexual activity: Yes  ?Other Topics Concern  ? Not on file  ?Social History Narrative  ? 1 living  ? 2 deceased  ? ?Social Determinants of Health  ? ?Financial Resource Strain: Not on file  ?Food Insecurity: Not on file  ?Transportation Needs: Not on file  ?Physical Activity: Not on file  ?Stress: Not on file  ?Social Connections: Not on file  ?Intimate Partner Violence: Not on file  ? ? ?Review of Systems: ?See HPI, otherwise negative ROS ? ?Physical Exam: ?Vital signs in last 24 hours: ?Temp:  [98.2 ?F (36.8 ?C)] 98.2 ?F (36.8 ?C) (05/01 0932) ?Pulse Rate:  [64] 64 (05/01 0715) ?Resp:  [15-23] 23 (05/01 0715) ?BP: (118)/(59) 118/59 (05/01  0709) ?SpO2:  [93 %-95 %] 93 % (05/01 0715) ?  ?General:   Alert,  Well-developed, well-nourished, pleasant and cooperative in NAD ?Head:  Normocephalic and atraumatic. ?Eyes:  Sclera clear, no icterus.   Conjunc

## 2021-10-20 NOTE — Anesthesia Postprocedure Evaluation (Signed)
Anesthesia Post Note ? ?Patient: KHAMRYN CALDERONE ? ?Procedure(s) Performed: COLONOSCOPY WITH PROPOFOL (Left) ?POLYPECTOMY ?BIOPSY ? ?Patient location during evaluation: Phase II ?Anesthesia Type: General ?Level of consciousness: awake ?Pain management: pain level controlled ?Vital Signs Assessment: post-procedure vital signs reviewed and stable ?Respiratory status: spontaneous breathing and respiratory function stable ?Cardiovascular status: blood pressure returned to baseline and stable ?Postop Assessment: no headache and no apparent nausea or vomiting ?Anesthetic complications: no ?Comments: Late entry ? ? ?No notable events documented. ? ? ?Last Vitals:  ?Vitals:  ? 10/20/21 0715 10/20/21 0805  ?BP:  (!) 100/54  ?Pulse: 64 74  ?Resp: (!) 23 15  ?Temp:  36.7 ?C  ?SpO2: 93% 92%  ?  ?Last Pain:  ?Vitals:  ? 10/20/21 0805  ?TempSrc: Oral  ?PainSc: 0-No pain  ? ? ?  ?  ?  ?  ?  ?  ? ?Louann Sjogren ? ? ? ? ?

## 2021-10-20 NOTE — Discharge Instructions (Addendum)
?  Colonoscopy ?Discharge Instructions ? ?Read the instructions outlined below and refer to this sheet in the next few weeks. These discharge instructions provide you with general information on caring for yourself after you leave the hospital. Your doctor may also give you specific instructions. While your treatment has been planned according to the most current medical practices available, unavoidable complications occasionally occur.  ? ?ACTIVITY ?You may resume your regular activity, but move at a slower pace for the next 24 hours.  ?Take frequent rest periods for the next 24 hours.  ?Walking will help get rid of the air and reduce the bloated feeling in your belly (abdomen).  ?No driving for 24 hours (because of the medicine (anesthesia) used during the test).   ?Do not sign any important legal documents or operate any machinery for 24 hours (because of the anesthesia used during the test).  ?NUTRITION ?Drink plenty of fluids.  ?You may resume your normal diet as instructed by your doctor.  ?Begin with a light meal and progress to your normal diet. Heavy or fried foods are harder to digest and may make you feel sick to your stomach (nauseated).  ?Avoid alcoholic beverages for 24 hours or as instructed.  ?MEDICATIONS ?You may resume your normal medications unless your doctor tells you otherwise.  ?WHAT YOU CAN EXPECT TODAY ?Some feelings of bloating in the abdomen.  ?Passage of more gas than usual.  ?Spotting of blood in your stool or on the toilet paper.  ?IF YOU HAD POLYPS REMOVED DURING THE COLONOSCOPY: ?No aspirin products for 7 days or as instructed.  ?No alcohol for 7 days or as instructed.  ?Eat a soft diet for the next 24 hours.  ?FINDING OUT THE RESULTS OF YOUR TEST ?Not all test results are available during your visit. If your test results are not back during the visit, make an appointment with your caregiver to find out the results. Do not assume everything is normal if you have not heard from your  caregiver or the medical facility. It is important for you to follow up on all of your test results.  ?SEEK IMMEDIATE MEDICAL ATTENTION IF: ?You have more than a spotting of blood in your stool.  ?Your belly is swollen (abdominal distention).  ?You are nauseated or vomiting.  ?You have a temperature over 101.  ?You have abdominal pain or discomfort that is severe or gets worse throughout the day.  ? ?Your colonoscopy revealed 7 polyp(s) which I removed successfully. Await pathology results, my office will contact you. I recommend repeating colonoscopy in 3 years for surveillance purposes. You also have diverticulosis and internal hemorrhoids. I would recommend increasing fiber in your diet or adding OTC Benefiber/Metamucil. Be sure to drink at least 6 glasses of water daily.  ? ?Took biopsies of your colon to rule out a condition called microscopic colitis which can cause chronic diarrhea especially in women. ? ?Follow-up with Dr. Abbey Chatters in 3 months. ? ?I hope you have a great rest of your week! ? ?Elon Alas. Abbey Chatters, D.O. ?Gastroenterology and Hepatology ?[facility] ? ?

## 2021-10-21 ENCOUNTER — Encounter: Payer: Self-pay | Admitting: Internal Medicine

## 2021-10-22 LAB — SURGICAL PATHOLOGY

## 2021-10-27 ENCOUNTER — Encounter (HOSPITAL_COMMUNITY): Payer: Self-pay | Admitting: Internal Medicine

## 2021-10-27 DIAGNOSIS — E1151 Type 2 diabetes mellitus with diabetic peripheral angiopathy without gangrene: Secondary | ICD-10-CM | POA: Diagnosis not present

## 2021-10-27 DIAGNOSIS — M79675 Pain in left toe(s): Secondary | ICD-10-CM | POA: Diagnosis not present

## 2021-10-27 DIAGNOSIS — B351 Tinea unguium: Secondary | ICD-10-CM | POA: Diagnosis not present

## 2021-11-05 DIAGNOSIS — N3944 Nocturnal enuresis: Secondary | ICD-10-CM | POA: Diagnosis not present

## 2021-11-05 DIAGNOSIS — N3946 Mixed incontinence: Secondary | ICD-10-CM | POA: Diagnosis not present

## 2021-11-18 ENCOUNTER — Other Ambulatory Visit: Payer: Self-pay | Admitting: Internal Medicine

## 2021-11-19 DIAGNOSIS — E1165 Type 2 diabetes mellitus with hyperglycemia: Secondary | ICD-10-CM | POA: Diagnosis not present

## 2021-11-20 ENCOUNTER — Encounter: Payer: Self-pay | Admitting: Internal Medicine

## 2021-11-20 ENCOUNTER — Ambulatory Visit (INDEPENDENT_AMBULATORY_CARE_PROVIDER_SITE_OTHER): Payer: Medicare Other | Admitting: Internal Medicine

## 2021-11-20 VITALS — BP 110/58 | HR 93 | Temp 97.2°F | Ht 64.0 in | Wt 189.4 lb

## 2021-11-20 DIAGNOSIS — R1319 Other dysphagia: Secondary | ICD-10-CM

## 2021-11-20 DIAGNOSIS — R159 Full incontinence of feces: Secondary | ICD-10-CM | POA: Diagnosis not present

## 2021-11-20 DIAGNOSIS — K219 Gastro-esophageal reflux disease without esophagitis: Secondary | ICD-10-CM

## 2021-11-20 DIAGNOSIS — R152 Fecal urgency: Secondary | ICD-10-CM

## 2021-11-20 DIAGNOSIS — K591 Functional diarrhea: Secondary | ICD-10-CM

## 2021-11-20 MED ORDER — RABEPRAZOLE SODIUM 20 MG PO TBEC
20.0000 mg | DELAYED_RELEASE_TABLET | Freq: Two times a day (BID) | ORAL | 5 refills | Status: DC
Start: 2021-11-20 — End: 2022-05-28

## 2021-11-20 NOTE — Patient Instructions (Signed)
I am happy to hear that you are doing better.  Continue Imodium as needed.  For your chronic reflux, I will change your pantoprazole to Aciphex 20 mg twice daily.  Follow-up in 3 to 4 months or sooner if needed.  It was nice to meet seeing you again today.  Dr. Abbey Chatters

## 2021-11-20 NOTE — Progress Notes (Signed)
Referring Provider: Celene Squibb, MD Primary Care Physician:  Celene Squibb, MD Primary GI:  Dr. Abbey Chatters  Chief Complaint  Patient presents with   Follow-up    Follow up on diarrhea    HPI:   Chloe Golden is a 76 y.o. female who presents to clinic today for follow-up visit.    History of IBS, diarrhea predominant.  Underwent colonoscopy 10/20/2021 with multiple tubular adenomas removed, random colon biopsies negative for microscopic colitis.  Recommended 3-year recall  She has had issues of incontinence in the past.  She underwent anorectal manometry in 2014 at [facility].  This revealed pelvic floor dyssynergia.  Was recommended she go to physical therapy for this.  Recently seen by urology as well who again recommended pelvic floor PT.    Status post cholecystectomy.  Duodenal biopsies in the past negative for celiac disease.  After previous visit, I recommended she be more aggressive with taking Imodium scheduled.  She states her symptoms are vastly improved.  Has had 1 day of diarrhea in the last 2 weeks.  Also with chronic GERD and dysphagia. Dysphagia chronic, not improved status post esophageal dilation.  Esophageal dysmotility seen on previous esophagram.  Switched her from [place] which she was on for many years to pantoprazole.  States this is not helping.  Wishes to switch to a different medication.  Past Medical History:  Diagnosis Date   Acid reflux    Arthritis    Degenerative arthritis of cervical spine    Fibromyalgia    Functional gastrointestinal disorder    GERD (gastroesophageal reflux disease)    Glaucoma    Hiatal hernia    Hypertension    Neuropathy    OSA on CPAP    PAD (peripheral artery disease) (HCC)    Mild by arterial Dopplers April 2019   Rheumatoid arthritis(714.0)    Venous reflux     Past Surgical History:  Procedure Laterality Date   ABDOMINAL HYSTERECTOMY     BACTERIAL OVERGROWTH TEST N/A 11/09/2012   Procedure: BACTERIAL OVERGROWTH  TEST;  Surgeon: Danie Binder, MD;  [facility];  Service: Endoscopy;  Laterality: N/A;  7:30   BALLOON DILATION N/A 07/07/2021   Procedure: BALLOON DILATION;  Surgeon: Eloise Harman, DO;  [facility];  Service: Endoscopy;  Laterality: N/A;   BIOPSY N/A 10/28/2012   Procedure: BIOPSY;  Surgeon: Danie Binder, MD;  [facility];  Service: Endoscopy;  Laterality: N/A;  DUODENAL BIOPSY   BIOPSY  07/07/2021   Procedure: BIOPSY;  Surgeon: Eloise Harman, DO;  [facility];  Service: Endoscopy;;   BIOPSY  10/20/2021   Procedure: BIOPSY;  Surgeon: Eloise Harman, DO;  [facility];  Service: Endoscopy;;   BLADDER SURGERY     CARPAL TUNNEL RELEASE Bilateral    CHOLECYSTECTOMY     COLONOSCOPY WITH ESOPHAGOGASTRODUODENOSCOPY (EGD) N/A 10/28/2012   ZDG:[id number] diverticulosis in the descending colon and sigmoid colon/Small internal hemorrhoids   COLONOSCOPY WITH PROPOFOL Left 10/20/2021   Procedure: COLONOSCOPY WITH PROPOFOL;  Surgeon: Eloise Harman, DO;  [facility];  Service: Endoscopy;  Laterality: Left;  7:30am   ESOPHAGOGASTRODUODENOSCOPY N/A 11/27/2016   Procedure: ESOPHAGOGASTRODUODENOSCOPY (EGD);  Surgeon: Danie Binder, MD;  [facility];  Service: Endoscopy;  Laterality: N/A;  830    ESOPHAGOGASTRODUODENOSCOPY (EGD) WITH ESOPHAGEAL DILATION N/A 10/28/2012   QVZ:[id number] CERVIAL WEB, S/P DILATION/MILD Non-erosive gastritis & DUODENITIS  ESOPHAGOGASTRODUODENOSCOPY (EGD) WITH PROPOFOL N/A 07/07/2021   Procedure: ESOPHAGOGASTRODUODENOSCOPY (EGD) WITH PROPOFOL;  Surgeon: Eloise Harman, DO;  [facility];  Service: Endoscopy;  Laterality: N/A;  9:30am   POLYPECTOMY  10/20/2021   Procedure: POLYPECTOMY;  Surgeon: Eloise Harman, DO;  [facility];  Service: Endoscopy;;   SAVORY DILATION N/A 11/27/2016   Procedure: SAVORY DILATION;  Surgeon: Danie Binder, MD;  [facility];  Service: Endoscopy;  Laterality: N/A;   TOTAL KNEE ARTHROPLASTY Right     Current Outpatient Medications  Medication Sig Dispense Refill   ACCU-CHEK GUIDE test strip See admin instructions.     ALPRAZolam (XANAX) 0.5 MG tablet Take 0.5-1 mg by mouth See admin instructions. 0.5 mg in the morning, 1 mg at bedtime     amLODipine (NORVASC) 5 MG tablet Take 5 mg by mouth daily.     baclofen (LIORESAL) 10 MG tablet Take 10 mg by mouth 2 (two) times daily.     Coenzyme Q10 (COQ-10) 100 MG CAPS Take 100 mg by mouth See admin instructions. Twice a day on Monday, Wednesday and Friday and 1 capsule all other days.     conjugated estrogens (PREMARIN) vaginal cream Place 1 applicator vaginally See admin instructions. Twice a week as directed     donepezil (ARICEPT) 10 MG tablet Take 10 mg by mouth daily.     ezetimibe (ZETIA) 10 MG tablet Take 10 mg by mouth at bedtime.      furosemide (LASIX) 20 MG tablet Take 20 mg by mouth daily.     GEMTESA 75 MG TABS Take 75 mg by mouth daily.     hydroxychloroquine (PLAQUENIL) 200 MG tablet Take 400 mg by mouth daily.     JANUVIA 100 MG tablet Take 100 mg by mouth daily.     JARDIANCE 10 MG TABS tablet Take 10 mg by mouth daily.     losartan-hydrochlorothiazide (HYZAAR) 100-12.5 MG tablet Take 1 tablet by mouth daily.     LYRICA 50 MG capsule Take 50 mg by mouth 2 (two) times daily.     meclizine (ANTIVERT) 25 MG tablet Take 25 mg by mouth 3 (three) times daily as needed for dizziness.     Multiple Vitamins-Minerals (MULTIVITAMIN WITH MINERALS) tablet Take 1 tablet by mouth daily.     nebivolol (BYSTOLIC) 5 MG tablet Take 5 mg by mouth at bedtime.     Omega-3 Fatty Acids (FISH OIL) 1000 MG CAPS Take 1,000 mg by mouth daily.     ondansetron (ZOFRAN) 4 MG tablet Take 1 tablet (4 mg total) by mouth every 6 (six) hours. (Patient taking differently: Take 4 mg by mouth every 6 (six) hours as needed for nausea or vomiting.) 12 tablet 0   potassium chloride  (KLOR-CON) 10 MEQ tablet Take 10 mEq by mouth daily.     RABEprazole (ACIPHEX) 20 MG tablet Take 1 tablet (20 mg total) by mouth in the morning and at bedtime. 60 tablet 5   rOPINIRole (REQUIP) 0.5 MG tablet Take 1.5 mg by mouth at bedtime.     simvastatin (ZOCOR) 20 MG tablet Take 20 mg by mouth every Monday, Wednesday, and Friday.     solifenacin (VESICARE) 5 MG tablet Take 5 mg by mouth daily.     traZODone (DESYREL) 100 MG tablet Take 100 mg by mouth at bedtime.     TURMERIC CURCUMIN PO Take 1,000 mg by mouth daily.     vortioxetine HBr (TRINTELLIX) 10  MG TABS tablet Take 10 mg by mouth daily.     leflunomide (ARAVA) 10 MG tablet Take 10 mg by mouth daily. (Patient not taking: Reported on 11/20/2021)     No current facility-administered medications for this visit.    Allergies as of 11/20/2021 - Review Complete 11/20/2021  Allergen Reaction Noted   Codeine Other (See Comments) 09/26/2012   Dicyclomine  01/05/2013   Ibuprofen Other (See Comments) 09/26/2012   Sulfa antibiotics  11/24/2016   Valium [diazepam] Other (See Comments) 09/26/2012    Family History  Problem Relation Age of Onset   Lung cancer Father    Diabetes Mellitus II Father    Crohn's disease Cousin    Hypertension Mother    Cystic fibrosis Mother    Lung cancer Brother    Hypertension Brother    Lupus Niece    Colon cancer Neg Hx    Celiac disease Neg Hx    Gastric cancer Neg Hx    Esophageal cancer Neg Hx     Social History   Socioeconomic History   Marital status: Single    Spouse name: Not on file   Number of children: Not on file   Years of education: Not on file   Highest education level: Not on file  Occupational History   Not on file  Tobacco Use   Smoking status: Never   Smokeless tobacco: Never  Vaping Use   Vaping Use: Never used  Substance and Sexual Activity   Alcohol use: No   Drug use: No   Sexual activity: Yes  Other Topics Concern   Not on file  Social History Narrative   1  living   2 deceased   Social Determinants of Health   Financial Resource Strain: Not on file  Food Insecurity: Not on file  Transportation Needs: Not on file  Physical Activity: Not on file  Stress: Not on file  Social Connections: Not on file    Subjective: Review of Systems  Constitutional:  Negative for chills and fever.  HENT:  Negative for congestion and hearing loss.   Eyes:  Negative for blurred vision and double vision.  Respiratory:  Negative for cough and shortness of breath.   Cardiovascular:  Negative for chest pain and palpitations.  Gastrointestinal:  Positive for diarrhea. Negative for abdominal pain, blood in stool, constipation, heartburn, melena and vomiting.       Fecal incontinence  Genitourinary:  Negative for dysuria and urgency.  Musculoskeletal:  Negative for joint pain and myalgias.  Skin:  Negative for itching and rash.  Neurological:  Negative for dizziness and headaches.  Psychiatric/Behavioral:  Negative for depression. The patient is not nervous/anxious.     Objective: BP (!) 110/58   Pulse 93   Temp (!) 97.2 F (36.2 C)   Ht '5\' 4"'$  (1.626 m)   Wt 189 lb 6.4 oz (85.9 kg)   BMI 32.51 kg/m  Physical Exam Constitutional:      Appearance: Normal appearance.  HENT:     Head: Normocephalic and atraumatic.  Eyes:     Extraocular Movements: Extraocular movements intact.     Conjunctiva/sclera: Conjunctivae normal.  Cardiovascular:     Rate and Rhythm: Normal rate and regular rhythm.  Pulmonary:     Effort: Pulmonary effort is normal.     Breath sounds: Normal breath sounds.  Abdominal:     General: Bowel sounds are normal.     Palpations: Abdomen is soft.  Musculoskeletal:  General: No swelling. Normal range of motion.     Cervical back: Normal range of motion and neck supple.  Skin:    General: Skin is warm and dry.     Coloration: Skin is not jaundiced.  Neurological:     General: No focal deficit present.     Mental Status:  She is alert and oriented to person, place, and time.  Psychiatric:        Mood and Affect: Mood normal.        Behavior: Behavior normal.     Assessment: *Diarrhea-chronic *Fecal incontinence *Dysphagia-chronic, due to dysmotility *Pelvic floor dyssynergia  Plan: Discussed patient's diarrhea and incontinence in depth today.  She does have evidence of pelvic floor dyssynergia on anorectal manometry dating back to 2014.  Currently seeing urology as well who has recommended pelvic floor physical therapy which I agree with.    Patient's chronic diarrhea likely due to irritable bowel syndrome, diarrhea predominant.  Negative stool studies.  Negative fecal elastase.  Continue Imodium as needed.  Patient's symptoms are vastly improved.  Status postcholecystectomy, possible bile acid induced diarrhea.  Consider trial of cholestyramine pending above work-up.  Dysphagia chronic, not improved status post esophageal dilation.  Likely due to esophageal dysmotility which was seen on previous esophagram.  Continue conservative management for this.    We will change Protonix to Aciphex twice daily.  Nexium did not help in the past.  Was on Dexilant for many years.  Patient to avoid tough textures. All meats should be chopped finely. Eat slowly, take small bites, chew thoroughly, and drink plenty of liquids throughout meals.   Follow-up 3-4 months  11/20/2021 3:17 PM   Disclaimer: This note was dictated with voice recognition software. Similar sounding words can inadvertently be transcribed and may not be corrected upon review.

## 2021-11-28 DIAGNOSIS — Z79899 Other long term (current) drug therapy: Secondary | ICD-10-CM | POA: Diagnosis not present

## 2021-12-11 DIAGNOSIS — N390 Urinary tract infection, site not specified: Secondary | ICD-10-CM | POA: Diagnosis not present

## 2021-12-11 DIAGNOSIS — N3281 Overactive bladder: Secondary | ICD-10-CM | POA: Diagnosis not present

## 2021-12-11 DIAGNOSIS — K219 Gastro-esophageal reflux disease without esophagitis: Secondary | ICD-10-CM | POA: Diagnosis not present

## 2021-12-11 DIAGNOSIS — R251 Tremor, unspecified: Secondary | ICD-10-CM | POA: Insufficient documentation

## 2021-12-11 DIAGNOSIS — R1310 Dysphagia, unspecified: Secondary | ICD-10-CM | POA: Diagnosis not present

## 2021-12-11 DIAGNOSIS — Z0001 Encounter for general adult medical examination with abnormal findings: Secondary | ICD-10-CM | POA: Diagnosis not present

## 2021-12-11 DIAGNOSIS — E1165 Type 2 diabetes mellitus with hyperglycemia: Secondary | ICD-10-CM | POA: Diagnosis not present

## 2021-12-11 DIAGNOSIS — I10 Essential (primary) hypertension: Secondary | ICD-10-CM | POA: Diagnosis not present

## 2021-12-11 DIAGNOSIS — K581 Irritable bowel syndrome with constipation: Secondary | ICD-10-CM | POA: Diagnosis not present

## 2021-12-11 DIAGNOSIS — E782 Mixed hyperlipidemia: Secondary | ICD-10-CM | POA: Diagnosis not present

## 2021-12-11 DIAGNOSIS — M069 Rheumatoid arthritis, unspecified: Secondary | ICD-10-CM | POA: Diagnosis not present

## 2021-12-30 ENCOUNTER — Encounter: Payer: Self-pay | Admitting: Urology

## 2021-12-30 ENCOUNTER — Ambulatory Visit (INDEPENDENT_AMBULATORY_CARE_PROVIDER_SITE_OTHER): Payer: Medicare Other | Admitting: Urology

## 2021-12-30 VITALS — BP 119/65 | HR 78

## 2021-12-30 DIAGNOSIS — Z87440 Personal history of urinary (tract) infections: Secondary | ICD-10-CM | POA: Diagnosis not present

## 2021-12-30 DIAGNOSIS — N3941 Urge incontinence: Secondary | ICD-10-CM | POA: Diagnosis not present

## 2021-12-30 DIAGNOSIS — N3281 Overactive bladder: Secondary | ICD-10-CM | POA: Diagnosis not present

## 2021-12-30 DIAGNOSIS — R300 Dysuria: Secondary | ICD-10-CM

## 2021-12-30 LAB — URINALYSIS, ROUTINE W REFLEX MICROSCOPIC
Bilirubin, UA: NEGATIVE
Ketones, UA: NEGATIVE
Leukocytes,UA: NEGATIVE
Nitrite, UA: NEGATIVE
Protein,UA: NEGATIVE
RBC, UA: NEGATIVE
Specific Gravity, UA: <1.005 — ABNORMAL LOW (ref 1.005–1.030)
Urobilinogen, Ur: 0.2 mg/dL (ref 0.2–1.0)
pH, UA: 5.5 (ref 5.0–7.5)

## 2021-12-30 LAB — BLADDER SCAN AMB NON-IMAGING: Scan Result: 82

## 2021-12-30 NOTE — Progress Notes (Signed)
Assessment: 1. Dysuria   2. Urge incontinence   3. History of UTI   4. OAB (overactive bladder)     Plan: Continue combination therapy with Gemtesa and Vesicare. Urine culture sent today -will postpone any treatment until results available given urinalysis results. Will call with results AZO prn Recommend follow-up with Dr. Matilde Sprang for her refractory urinary incontinence  Chief Complaint: Chief Complaint  Patient presents with   Urinary Incontinence    HPI: Chloe Golden is a 76 y.o. female who presents for continued evaluation of overactive bladder symptoms and urge incontinence.   She has had urinary symptoms for at least 20 years.  She has been followed by Dr. Titus Mould in [place], [place].  Review of her records from Dr. Titus Mould indicates that she previously underwent treatment for stress urinary incontinence with a TVT sling in November 2013.  She also underwent InterStim implant in January 2020.  She initially had good results with the InterStim.  She apparently had a return of her urinary symptoms and was placed on Myrbetriq.  Her InterStim was ultimately removed in May 2021 due to persistence of her urinary symptoms.  She has tried other medications for her urinary symptoms without benefit.  She was previously managed with Myrbetriq and was changed to [place] approximately 6 months ago.  She continued to have symptoms of urgency, urge incontinence, primarily at night, hesitancy, intermittent stream, stranguria, and sensation of incomplete emptying.  She uses pads on occasion.  Further evaluation with cystoscopy and urodynamics was recommended by Dr. Titus Mould.  She has not proceeded with this evaluation due to a change in her insurance.  She also reports a history of recurrent UTIs for the past 10 years.  She has been treated with multiple antibiotics.  She currently has a prescription for antibiotics for as needed use.  No culture results available for review. PVR from  07/02/2021: 37 mL  At her visit in 2/23, she continued to have symptoms of frequency, urgency, nocturia, and urge incontinence.  No dysuria or gross hematuria. Pelvic exam demonstrated a grade 2 cystocele. Cystoscopy showed a normal bladder and urethra. She was evaluated with urodynamics on 08/25/2021.  This study showed a small capacity bladder with max capacity of 172 mL and increased sensation.  No bladder instability was seen.  No stress incontinence noted.  Voiding phase showed a peak pressure of 16 cm of water with complete emptying.  Her flow was intermittent and she did have increased EMG activity during voiding. She saw Dr. Matilde Sprang at [facility] in April 2023.  She was started on combination therapy with Vesicare and Gemtesa.  At her last visit in May 2023, her incontinence has significantly improved.  She presents today for evaluation of a 4-day history of dysuria.  She continues on [place].  She reports continued symptoms of frequency, urgency, and urge incontinence.  She has been taking AZO for the dysuria with some improvement.  She has not taken any antibiotics recently.  Portions of the above documentation were copied from a prior visit for review purposes only.  Allergies: Allergies  Allergen Reactions   Codeine Other (See Comments)    Constipation   Dicyclomine     TURNED HER SKIN BLUE   Ibuprofen Other (See Comments)    Ulcers    Sulfa Antibiotics     Yeast infections    Valium [Diazepam] Other (See Comments)    Alters mental status    PMH: Past Medical History:  Diagnosis  Date   Acid reflux    Arthritis    Degenerative arthritis of cervical spine    Fibromyalgia    Functional gastrointestinal disorder    GERD (gastroesophageal reflux disease)    Glaucoma    Hiatal hernia    Hypertension    Neuropathy    OSA on CPAP    PAD (peripheral artery disease) (HCC)    Mild by arterial Dopplers April 2019   Rheumatoid arthritis(714.0)    Venous  reflux     PSH: Past Surgical History:  Procedure Laterality Date   ABDOMINAL HYSTERECTOMY     BACTERIAL OVERGROWTH TEST N/A 11/09/2012   Procedure: BACTERIAL OVERGROWTH TEST;  Surgeon: Danie Binder, MD;  [facility];  Service: Endoscopy;  Laterality: N/A;  7:30   BALLOON DILATION N/A 07/07/2021   Procedure: BALLOON DILATION;  Surgeon: Eloise Harman, DO;  [facility];  Service: Endoscopy;  Laterality: N/A;   BIOPSY N/A 10/28/2012   Procedure: BIOPSY;  Surgeon: Danie Binder, MD;  [facility];  Service: Endoscopy;  Laterality: N/A;  DUODENAL BIOPSY   BIOPSY  07/07/2021   Procedure: BIOPSY;  Surgeon: Eloise Harman, DO;  [facility];  Service: Endoscopy;;   BIOPSY  10/20/2021   Procedure: BIOPSY;  Surgeon: Eloise Harman, DO;  [facility];  Service: Endoscopy;;   BLADDER SURGERY     CARPAL TUNNEL RELEASE Bilateral    CHOLECYSTECTOMY     COLONOSCOPY WITH ESOPHAGOGASTRODUODENOSCOPY (EGD) N/A 10/28/2012   HUD:[id number] diverticulosis in the descending colon and sigmoid colon/Small internal hemorrhoids   COLONOSCOPY WITH PROPOFOL Left 10/20/2021   Procedure: COLONOSCOPY WITH PROPOFOL;  Surgeon: Eloise Harman, DO;  [facility];  Service: Endoscopy;  Laterality: Left;  7:30am   ESOPHAGOGASTRODUODENOSCOPY N/A 11/27/2016   Procedure: ESOPHAGOGASTRODUODENOSCOPY (EGD);  Surgeon: Danie Binder, MD;  [facility];  Service: Endoscopy;  Laterality: N/A;  830    ESOPHAGOGASTRODUODENOSCOPY (EGD) WITH ESOPHAGEAL DILATION N/A 10/28/2012   ZCH:[id number] CERVIAL WEB, S/P DILATION/MILD Non-erosive gastritis & DUODENITIS   ESOPHAGOGASTRODUODENOSCOPY (EGD) WITH PROPOFOL N/A 07/07/2021   Procedure: ESOPHAGOGASTRODUODENOSCOPY (EGD) WITH PROPOFOL;  Surgeon: Eloise Harman, DO;  [facility];  Service: Endoscopy;  Laterality: N/A;  9:30am   POLYPECTOMY  10/20/2021   Procedure: POLYPECTOMY;  Surgeon: Eloise Harman, DO;  [facility];  Service: Endoscopy;;   SAVORY DILATION N/A 11/27/2016   Procedure: SAVORY DILATION;  Surgeon: Danie Binder, MD;  [facility];  Service: Endoscopy;  Laterality: N/A;   TOTAL KNEE ARTHROPLASTY Right     SH: Social History   Tobacco Use   Smoking status: Never   Smokeless tobacco: Never  Vaping Use   Vaping Use: Never used  Substance Use Topics   Alcohol use: No   Drug use: No    ROS: Constitutional:  Negative for fever, chills, weight loss CV: Negative for chest pain, previous MI, hypertension Respiratory:  Negative for shortness of breath, wheezing, sleep apnea, frequent cough GI:  Negative for nausea, vomiting, bloody stool, GERD  PE: BP 119/65   Pulse 78  GENERAL APPEARANCE:  Well appearing, well developed, well nourished, NAD HEENT:  Atraumatic, normocephalic, oropharynx clear NECK:  Supple without lymphadenopathy or thyromegaly ABDOMEN:  Soft, non-tender, no masses EXTREMITIES:  Moves all extremities well, without clubbing, cyanosis, or edema NEUROLOGIC:  Alert and oriented x 3, normal gait, CN II-XII grossly intact MENTAL STATUS:  appropriate BACK:  Non-tender to palpation, No CVAT SKIN:  Warm, dry, and intact   Results: U/A dipstick: 2+ glucose   PVR = 82 ml

## 2022-01-01 DIAGNOSIS — R293 Abnormal posture: Secondary | ICD-10-CM | POA: Diagnosis not present

## 2022-01-01 DIAGNOSIS — R531 Weakness: Secondary | ICD-10-CM | POA: Diagnosis not present

## 2022-01-01 DIAGNOSIS — M545 Low back pain, unspecified: Secondary | ICD-10-CM | POA: Diagnosis not present

## 2022-01-01 DIAGNOSIS — M25552 Pain in left hip: Secondary | ICD-10-CM | POA: Diagnosis not present

## 2022-01-01 DIAGNOSIS — R2689 Other abnormalities of gait and mobility: Secondary | ICD-10-CM | POA: Diagnosis not present

## 2022-01-01 LAB — URINE CULTURE

## 2022-01-01 MED ORDER — CIPROFLOXACIN HCL 500 MG PO TABS: 500.0000 mg | ORAL_TABLET | Freq: Two times a day (BID) | ORAL | 0 refills | Status: AC

## 2022-01-01 NOTE — Addendum Note (Signed)
Addended by: Primus Bravo on: 01/01/2022 04:32 PM   Modules accepted: Orders

## 2022-01-02 ENCOUNTER — Telehealth: Payer: Self-pay

## 2022-01-02 NOTE — Telephone Encounter (Signed)
FYI patient aware of results rx at pharmacy.  She just wanted to let you know she is still having the symptoms.

## 2022-01-02 NOTE — Telephone Encounter (Signed)
-----   Message from Primus Bravo, MD sent at 01/01/2022  4:31 PM EDT ----- Please notify Chloe Golden that her urine culture was inconclusive for UTI.  Given her symptoms, I have sent in a prescription for Cipro x3 days.

## 2022-01-05 ENCOUNTER — Encounter (HOSPITAL_COMMUNITY): Payer: Self-pay | Admitting: *Deleted

## 2022-01-05 ENCOUNTER — Emergency Department (HOSPITAL_COMMUNITY)
Admission: EM | Admit: 2022-01-05 | Discharge: 2022-01-05 | Disposition: A | Discharge status: Home / Self Care | Payer: Medicare Other | Attending: Emergency Medicine | Admitting: Emergency Medicine

## 2022-01-05 ENCOUNTER — Emergency Department (HOSPITAL_COMMUNITY): Payer: Medicare Other

## 2022-01-05 ENCOUNTER — Other Ambulatory Visit: Payer: Self-pay

## 2022-01-05 DIAGNOSIS — M542 Cervicalgia: Secondary | ICD-10-CM | POA: Diagnosis not present

## 2022-01-05 DIAGNOSIS — R519 Headache, unspecified: Secondary | ICD-10-CM | POA: Insufficient documentation

## 2022-01-05 DIAGNOSIS — W01198A Fall on same level from slipping, tripping and stumbling with subsequent striking against other object, initial encounter: Secondary | ICD-10-CM | POA: Diagnosis not present

## 2022-01-05 DIAGNOSIS — S0990XA Unspecified injury of head, initial encounter: Secondary | ICD-10-CM | POA: Diagnosis not present

## 2022-01-05 DIAGNOSIS — M4692 Unspecified inflammatory spondylopathy, cervical region: Secondary | ICD-10-CM | POA: Diagnosis not present

## 2022-01-05 DIAGNOSIS — M48061 Spinal stenosis, lumbar region without neurogenic claudication: Secondary | ICD-10-CM | POA: Diagnosis not present

## 2022-01-05 DIAGNOSIS — M40204 Unspecified kyphosis, thoracic region: Secondary | ICD-10-CM | POA: Diagnosis not present

## 2022-01-05 DIAGNOSIS — Z79899 Other long term (current) drug therapy: Secondary | ICD-10-CM | POA: Insufficient documentation

## 2022-01-05 DIAGNOSIS — W19XXXA Unspecified fall, initial encounter: Secondary | ICD-10-CM

## 2022-01-05 DIAGNOSIS — M25552 Pain in left hip: Secondary | ICD-10-CM | POA: Insufficient documentation

## 2022-01-05 DIAGNOSIS — I10 Essential (primary) hypertension: Secondary | ICD-10-CM | POA: Insufficient documentation

## 2022-01-05 DIAGNOSIS — M545 Low back pain, unspecified: Secondary | ICD-10-CM | POA: Diagnosis not present

## 2022-01-05 DIAGNOSIS — M4807 Spinal stenosis, lumbosacral region: Secondary | ICD-10-CM | POA: Diagnosis not present

## 2022-01-05 DIAGNOSIS — S199XXA Unspecified injury of neck, initial encounter: Secondary | ICD-10-CM | POA: Diagnosis not present

## 2022-01-05 DIAGNOSIS — M549 Dorsalgia, unspecified: Secondary | ICD-10-CM | POA: Insufficient documentation

## 2022-01-05 DIAGNOSIS — M47814 Spondylosis without myelopathy or radiculopathy, thoracic region: Secondary | ICD-10-CM | POA: Diagnosis not present

## 2022-01-05 DIAGNOSIS — M47812 Spondylosis without myelopathy or radiculopathy, cervical region: Secondary | ICD-10-CM | POA: Diagnosis not present

## 2022-01-05 MED ORDER — OXYCODONE-ACETAMINOPHEN 5-325 MG PO TABS
1.0000 | ORAL_TABLET | Freq: Once | ORAL | Status: AC
  Administered 2022-01-05: 1 via ORAL
  Filled 2022-01-05: qty 1

## 2022-01-05 MED ORDER — OXYCODONE-ACETAMINOPHEN 5-325 MG PO TABS: 1.0000 | ORAL_TABLET | Freq: Three times a day (TID) | ORAL | 0 refills | Status: AC | PRN

## 2022-01-05 NOTE — ED Provider Notes (Signed)
[facility] EMERGENCY DEPARTMENT Provider Note   CSN: [id number] Arrival date & time: 01/05/22  1040     History  Chief Complaint  Patient presents with   Chloe Golden    Chloe Golden is a 76 y.o. female.  HPI  Medical history including hypertension, fibromyalgia, rheumatoid arthritis, PAD disc disease of the cervical spine presents after a fall.  Patient states that yesterday she was bending over and as she stood up  lost her balance and fell directly onto her back, she states that she struck her head she denies loss of conscious, she is not on anticoag's. landed in the grass, able to get herself up off the ground and is able to ambulate but is having pain in her head neck and entire back, no endorsing chest pain or shortness of breath no stomach pain no pain in the other 4 extremities.  She states that prior to falling she was not having chest pain or shortness of breath, states she felt dizzy but this is normal for her but over the last 4 years.      Home Medications Prior to Admission medications   Medication Sig Start Date End Date Taking? Authorizing Provider  oxyCODONE-acetaminophen (PERCOCET/ROXICET) 5-325 MG tablet Take 1 tablet by mouth every 8 (eight) hours as needed for up to 4 days for severe pain. 01/05/22 01/09/22 Yes Marcello Fennel, PA-C  ACCU-CHEK GUIDE test strip See admin instructions. 12/12/20   [provider]  ALPRAZolam Duanne Moron) 0.5 MG tablet Take 0.5-1 mg by mouth See admin instructions. 0.5 mg in the morning, 1 mg at bedtime    [provider]  amLODipine (NORVASC) 5 MG tablet Take 5 mg by mouth daily. 09/20/21   [provider]  baclofen (LIORESAL) 10 MG tablet Take 10 mg by mouth 2 (two) times daily. 05/04/20   [provider]  Coenzyme Q10 (COQ-10) 100 MG CAPS Take 100 mg by mouth See admin instructions. Twice a day on Monday, Wednesday and Friday and 1 capsule all other days.    [provider]  conjugated  estrogens (PREMARIN) vaginal cream Place 1 applicator vaginally See admin instructions. Twice a week as directed    [provider]  donepezil (ARICEPT) 10 MG tablet Take 10 mg by mouth daily. 07/24/20   [provider]  ezetimibe (ZETIA) 10 MG tablet Take 10 mg by mouth at bedtime.  10/20/16   [provider]  furosemide (LASIX) 20 MG tablet Take 20 mg by mouth daily. 05/04/20   [provider]  GEMTESA 75 MG TABS Take 75 mg by mouth daily. 01/01/21   [provider]  hydroxychloroquine (PLAQUENIL) 200 MG tablet Take 400 mg by mouth daily. 01/02/21   [provider]  JANUVIA 100 MG tablet Take 100 mg by mouth daily. 05/04/20   [provider]  JARDIANCE 10 MG TABS tablet Take 10 mg by mouth daily. 05/03/21   [provider]  leflunomide (ARAVA) 10 MG tablet Take 10 mg by mouth daily. 09/22/21   [provider]  losartan-hydrochlorothiazide (HYZAAR) 100-12.5 MG tablet Take 1 tablet by mouth daily.    [provider]  LYRICA 50 MG capsule Take 50 mg by mouth 2 (two) times daily. 04/15/17   [provider]  meclizine (ANTIVERT) 25 MG tablet Take 25 mg by mouth 3 (three) times daily as needed for dizziness.    [provider]  Multiple Vitamins-Minerals (MULTIVITAMIN WITH MINERALS) tablet Take 1 tablet by mouth  daily.    [provider]  nebivolol (BYSTOLIC) 5 MG tablet Take 5 mg by mouth at bedtime.    [provider]  Omega-3 Fatty Acids (FISH OIL) 1000 MG CAPS Take 1,000 mg by mouth daily.    [provider]  ondansetron (ZOFRAN) 4 MG tablet Take 1 tablet (4 mg total) by mouth every 6 (six) hours. Patient taking differently: Take 4 mg by mouth every 6 (six) hours as needed for nausea or vomiting. 05/13/21   Azucena Cecil, PA-C  potassium chloride (KLOR-CON) 10 MEQ tablet Take 10 mEq by mouth daily. 09/20/21   [provider]  RABEprazole (ACIPHEX) 20 MG tablet  Take 1 tablet (20 mg total) by mouth in the morning and at bedtime. 11/20/21 05/19/22  Eloise Harman, DO  rOPINIRole (REQUIP) 0.5 MG tablet Take 1.5 mg by mouth at bedtime. 05/04/20   [provider]  simvastatin (ZOCOR) 20 MG tablet Take 20 mg by mouth every Monday, Wednesday, and Friday. 05/03/21   [provider]  solifenacin (VESICARE) 5 MG tablet Take 5 mg by mouth daily. 09/23/21   [provider]  traZODone (DESYREL) 100 MG tablet Take 100 mg by mouth at bedtime. 04/15/17   [provider]  TURMERIC CURCUMIN PO Take 1,000 mg by mouth daily.    [provider]  vortioxetine HBr (TRINTELLIX) 10 MG TABS tablet Take 10 mg by mouth daily.    [provider]      Allergies    Codeine, Dicyclomine, Ibuprofen, Sulfa antibiotics, and Valium [diazepam]    Review of Systems   Review of Systems  Constitutional:  Negative for chills and fever.  Respiratory:  Negative for shortness of breath.   Cardiovascular:  Negative for chest pain.  Gastrointestinal:  Negative for abdominal pain.  Musculoskeletal:  Positive for back pain.  Neurological:  Positive for headaches.    Physical Exam Updated Vital Signs BP (!) 106/56   Pulse 72   Temp 97.9 F (36.6 C) (Oral)   Resp 11   Ht '5\' 4"'$  (1.626 m)   Wt 88.5 kg   SpO2 95%   BMI 33.47 kg/m  Physical Exam Vitals and nursing note reviewed.  Constitutional:      General: She is not in acute distress.    Appearance: She is not ill-appearing.  HENT:     Head: Normocephalic and atraumatic.     Comments: No deformity of the head present no raccoon eyes or battle sign noted.    Nose: No congestion.     Mouth/Throat:     Mouth: Mucous membranes are moist.     Pharynx: Oropharynx is clear.     Comments: No trismus no torticollis Eyes:     Extraocular Movements: Extraocular movements intact.     Conjunctiva/sclera: Conjunctivae normal.     Pupils: Pupils are equal, round, and reactive to light.   Cardiovascular:     Rate and Rhythm: Normal rate and regular rhythm.     Pulses: Normal pulses.     Heart sounds: No murmur heard.    No friction rub. No gallop.  Pulmonary:     Effort: No respiratory distress.     Breath sounds: No wheezing, rhonchi or rales.  Chest:     Chest wall: No tenderness.  Abdominal:     Palpations: Abdomen is soft.     Tenderness: There is no abdominal tenderness. There is no right CVA tenderness or left CVA tenderness.  Musculoskeletal:  Cervical back: Tenderness present.     Comments: Spine was palpated tenderness throughout her entire spine, does not focalized, no crepitus or deformities noted.  No pelvis instability no leg shortening, did have slight left hip pain mainly on her left trochanter.  She is moving all 4 extremities without difficulty.  Skin:    General: Skin is warm and dry.  Neurological:     Mental Status: She is alert.     Comments: No facial asymmetry no difficulty with word finding following two-step commands no regular weakness present.  Psychiatric:        Mood and Affect: Mood normal.     ED Results / Procedures / Treatments   Labs (all labs ordered are listed, but only abnormal results are displayed) Labs Reviewed - No data to display  EKG None  Radiology CT Thoracic Spine Wo Contrast  Result Date: 01/05/2022 CLINICAL DATA:  Back trauma. Fall 2 days ago with diffuse back pain. EXAM: CT THORACIC AND LUMBAR SPINE WITHOUT CONTRAST TECHNIQUE: Multidetector CT imaging of the thoracic and lumbar spine was performed without contrast. Multiplanar CT image reconstructions were also generated. RADIATION DOSE REDUCTION: This exam was performed according to the departmental dose-optimization program which includes automated exposure control, adjustment of the mA and/or kV according to patient size and/or use of iterative reconstruction technique. COMPARISON:  Lumbar spine MRI 09/26/2020. CT abdomen and pelvis 05/13/2021. FINDINGS: CT  THORACIC SPINE FINDINGS Alignment: Increased thoracic kyphosis.  No listhesis. Vertebrae: No acute fracture or suspicious osseous lesion. Paraspinal and other soft tissues: Aortic and coronary atherosclerosis. Disc levels: Mild for age thoracic spondylosis. No evidence of significant stenosis. CT LUMBAR SPINE FINDINGS Segmentation: 5 lumbar type vertebrae. Alignment: Unchanged grade 1 anterolisthesis of L5 on S1, facet mediated. Vertebrae: No acute fracture or suspicious osseous lesion. Paraspinal and other soft tissues: Aortic atherosclerosis. Mild chronic extrahepatic biliary dilatation, likely physiologic following cholecystectomy. Disc levels: Similar appearance of lumbar disc and facet degeneration compared to the prior MRI. Mild disc space narrowing at L3-4 and moderate narrowing at L4-5. Right greater than left lateral recess stenosis at L4-5 due to disc bulging and right greater than left facet arthrosis. Mild bilateral neural foraminal stenosis at L5-S1 due to bulging uncovered disc and severe facet arthrosis. IMPRESSION: 1. No fracture identified in the thoracic or lumbar spine. 2. Unchanged lumbar disc and facet degeneration. 3. Aortic Atherosclerosis (ICD10-I70.0). Electronically Signed   By: Logan Bores M.D.   On: 01/05/2022 13:24   CT Lumbar Spine Wo Contrast  Result Date: 01/05/2022 CLINICAL DATA:  Back trauma. Fall 2 days ago with diffuse back pain. EXAM: CT THORACIC AND LUMBAR SPINE WITHOUT CONTRAST TECHNIQUE: Multidetector CT imaging of the thoracic and lumbar spine was performed without contrast. Multiplanar CT image reconstructions were also generated. RADIATION DOSE REDUCTION: This exam was performed according to the departmental dose-optimization program which includes automated exposure control, adjustment of the mA and/or kV according to patient size and/or use of iterative reconstruction technique. COMPARISON:  Lumbar spine MRI 09/26/2020. CT abdomen and pelvis 05/13/2021. FINDINGS: CT  THORACIC SPINE FINDINGS Alignment: Increased thoracic kyphosis.  No listhesis. Vertebrae: No acute fracture or suspicious osseous lesion. Paraspinal and other soft tissues: Aortic and coronary atherosclerosis. Disc levels: Mild for age thoracic spondylosis. No evidence of significant stenosis. CT LUMBAR SPINE FINDINGS Segmentation: 5 lumbar type vertebrae. Alignment: Unchanged grade 1 anterolisthesis of L5 on S1, facet mediated. Vertebrae: No acute fracture or suspicious osseous lesion. Paraspinal and other soft tissues: Aortic atherosclerosis. Mild  chronic extrahepatic biliary dilatation, likely physiologic following cholecystectomy. Disc levels: Similar appearance of lumbar disc and facet degeneration compared to the prior MRI. Mild disc space narrowing at L3-4 and moderate narrowing at L4-5. Right greater than left lateral recess stenosis at L4-5 due to disc bulging and right greater than left facet arthrosis. Mild bilateral neural foraminal stenosis at L5-S1 due to bulging uncovered disc and severe facet arthrosis. IMPRESSION: 1. No fracture identified in the thoracic or lumbar spine. 2. Unchanged lumbar disc and facet degeneration. 3. Aortic Atherosclerosis (ICD10-I70.0). Electronically Signed   By: Logan Bores M.D.   On: 01/05/2022 13:24   CT Head Wo Contrast  Result Date: 01/05/2022 CLINICAL DATA:  Trauma EXAM: CT HEAD WITHOUT CONTRAST CT CERVICAL SPINE WITHOUT CONTRAST TECHNIQUE: Multidetector CT imaging of the head and cervical spine was performed following the standard protocol without intravenous contrast. Multiplanar CT image reconstructions of the cervical spine were also generated. RADIATION DOSE REDUCTION: This exam was performed according to the departmental dose-optimization program which includes automated exposure control, adjustment of the mA and/or kV according to patient size and/or use of iterative reconstruction technique. COMPARISON:  None Available. FINDINGS: CT HEAD FINDINGS Brain: No  evidence of acute infarction, hemorrhage, hydrocephalus, extra-axial collection or mass lesion/mass effect. Vascular: No hyperdense vessel or unexpected calcification. Skull: Normal. Negative for fracture or focal lesion. Sinuses/Orbits: No acute finding. Other: None. CT CERVICAL SPINE FINDINGS Alignment: Normal. Skull base and vertebrae: No acute fracture. No primary bone lesion or focal pathologic process. Soft tissues and spinal canal: No prevertebral fluid or swelling. No visible canal hematoma. Disc levels: Mild multilevel degenerative disc disease. Mild multilevel facet arthropathy, most pronounced in the upper left cervical spine. Upper chest: Negative. Other: None. IMPRESSION: 1. No acute intracranial abnormality. 2. No evidence of acute cervical spine fracture or traumatic malalignment. Electronically Signed   By: Yetta Glassman M.D.   On: 01/05/2022 13:20   CT Cervical Spine Wo Contrast  Result Date: 01/05/2022 CLINICAL DATA:  Trauma EXAM: CT HEAD WITHOUT CONTRAST CT CERVICAL SPINE WITHOUT CONTRAST TECHNIQUE: Multidetector CT imaging of the head and cervical spine was performed following the standard protocol without intravenous contrast. Multiplanar CT image reconstructions of the cervical spine were also generated. RADIATION DOSE REDUCTION: This exam was performed according to the departmental dose-optimization program which includes automated exposure control, adjustment of the mA and/or kV according to patient size and/or use of iterative reconstruction technique. COMPARISON:  None Available. FINDINGS: CT HEAD FINDINGS Brain: No evidence of acute infarction, hemorrhage, hydrocephalus, extra-axial collection or mass lesion/mass effect. Vascular: No hyperdense vessel or unexpected calcification. Skull: Normal. Negative for fracture or focal lesion. Sinuses/Orbits: No acute finding. Other: None. CT CERVICAL SPINE FINDINGS Alignment: Normal. Skull base and vertebrae: No acute fracture. No primary  bone lesion or focal pathologic process. Soft tissues and spinal canal: No prevertebral fluid or swelling. No visible canal hematoma. Disc levels: Mild multilevel degenerative disc disease. Mild multilevel facet arthropathy, most pronounced in the upper left cervical spine. Upper chest: Negative. Other: None. IMPRESSION: 1. No acute intracranial abnormality. 2. No evidence of acute cervical spine fracture or traumatic malalignment. Electronically Signed   By: Yetta Glassman M.D.   On: 01/05/2022 13:20   DG Hip Unilat W or Wo Pelvis 2-3 Views Left  Result Date: 01/05/2022 CLINICAL DATA:  Left hip pain after fall EXAM: DG HIP (WITH OR WITHOUT PELVIS) 2-3V LEFT COMPARISON:  05/13/2021 sacrum radiographs and CT abdomen pelvis FINDINGS: There is no evidence of hip  fracture or dislocation. There is no evidence of arthropathy or other focal bone abnormality. IMPRESSION: Negative. Electronically Signed   By: Merilyn Baba M.D.   On: 01/05/2022 12:46    Procedures Procedures    Medications Ordered in ED Medications  oxyCODONE-acetaminophen (PERCOCET/ROXICET) 5-325 MG per tablet 1 tablet (1 tablet Oral Given 01/05/22 1353)    ED Course/ Medical Decision Making/ A&P                           Medical Decision Making Amount and/or Complexity of Data Reviewed Radiology: ordered.   This patient presents to the ED for concern of fall, this involves an extensive number of treatment options, and is a complaint that carries with it a high risk of complications and morbidity.  The differential diagnosis includes internal head bleed, spinal fracture, left hip fracture    Additional history obtained:  Additional history obtained from husband at bedside External records from outside source obtained and reviewed including PCP notes   Co morbidities that complicate the patient evaluation  N/A  Social Determinants of Health:  Geriatric    Lab Tests:  I Ordered, and personally interpreted labs.   The pertinent results include: N/A   Imaging Studies ordered:  I ordered imaging studies including x-ray of the left hip, CT of the C-spine and thoracic and lumbar spine I independently visualized and interpreted imaging which showed all of which were negative for acute findings I agree with the radiologist interpretation   Cardiac Monitoring:  The patient was maintained on a cardiac monitor.  I personally viewed and interpreted the cardiac monitored which showed an underlying rhythm of: Without signs of ischemia   Medicines ordered and prescription drug management:  I ordered medication including Flexeril for pain I have reviewed the patients home medicines and have made adjustments as needed  Critical Interventions:  N/A    Reevaluation:  Presents after a fall, concern for orthopedic injury will obtain imaging and reassess.  Reassessed resting comfortably updated on imaging, has no complaints agreement with plan and discharge.    Consultations Obtained:  N/A    Test Considered:  N/A    Rule out low suspicion for intracranial head bleed as patient denies loss of conscious, is not on anticoagulant, she does not endorse headaches, paresthesia/weakness in the upper and lower extremities, no focal deficits present on my exam, CT head negative.  Low suspicion for spinal cord abnormality or spinal fracture spine was palpated was nontender to palpation, patient has full range of motion in the upper and lower extremities CT spine negative.  Low suspicion for intrathoracic/intra-abdominal trauma both chest and abdomen nontender no evident trauma on my exam.  Low suspicion for orthopedic injury as imaging is negative for acute findings.     Dispostion and problem list  After consideration of the diagnostic results and the patients response to treatment, I feel that the patent would benefit from discharge.  Back pain-likely muscular in nature will provide with pain  medications, follow-up with PCP for further evaluation and strict return precautions.            Final Clinical Impression(s) / ED Diagnoses Final diagnoses:  Fall, initial encounter    Rx / DC Orders ED Discharge Orders          Ordered    oxyCODONE-acetaminophen (PERCOCET/ROXICET) 5-325 MG tablet  Every 8 hours PRN        01/05/22 1353  Marcello Fennel, PA-C 01/05/22 1354    Milton Ferguson, MD 01/08/22 1126

## 2022-01-05 NOTE — Discharge Instructions (Signed)
Likely you have strained the muscles in your back, may use over-the-counter pain medication as needed.  I have given you a short course of narcotics please take as prescribed.  This medication can make you drowsy do not consume alcohol or operate heavy machinery when taking this medication.  This medication is Tylenol in it do not take Tylenol and take this medication.    Follow-up with your PCP.  Come back to the emergency department if you develop chest pain, shortness of breath, severe abdominal pain, uncontrolled nausea, vomiting, diarrhea.

## 2022-01-05 NOTE — ED Triage Notes (Signed)
Pt states she was squatting and stood up and fell back, occurred two days ago per pt.  Pt states hitting her head on grass covered ground . Pt states her whole back painful.  Denies any blood thinners.  Pt with a skin tear to left wrist that occurred on Thursday, would like to have it looked at as well.

## 2022-01-15 DIAGNOSIS — M545 Low back pain, unspecified: Secondary | ICD-10-CM | POA: Diagnosis not present

## 2022-01-15 DIAGNOSIS — R531 Weakness: Secondary | ICD-10-CM | POA: Diagnosis not present

## 2022-01-15 DIAGNOSIS — R2689 Other abnormalities of gait and mobility: Secondary | ICD-10-CM | POA: Diagnosis not present

## 2022-01-15 DIAGNOSIS — R293 Abnormal posture: Secondary | ICD-10-CM | POA: Diagnosis not present

## 2022-01-15 DIAGNOSIS — M25552 Pain in left hip: Secondary | ICD-10-CM | POA: Diagnosis not present

## 2022-01-16 DIAGNOSIS — R293 Abnormal posture: Secondary | ICD-10-CM | POA: Diagnosis not present

## 2022-01-16 DIAGNOSIS — R2689 Other abnormalities of gait and mobility: Secondary | ICD-10-CM | POA: Diagnosis not present

## 2022-01-16 DIAGNOSIS — R531 Weakness: Secondary | ICD-10-CM | POA: Diagnosis not present

## 2022-01-16 DIAGNOSIS — M545 Low back pain, unspecified: Secondary | ICD-10-CM | POA: Diagnosis not present

## 2022-01-16 DIAGNOSIS — M25552 Pain in left hip: Secondary | ICD-10-CM | POA: Diagnosis not present

## 2022-01-19 DIAGNOSIS — M25552 Pain in left hip: Secondary | ICD-10-CM | POA: Diagnosis not present

## 2022-01-19 DIAGNOSIS — M545 Low back pain, unspecified: Secondary | ICD-10-CM | POA: Diagnosis not present

## 2022-01-19 DIAGNOSIS — R293 Abnormal posture: Secondary | ICD-10-CM | POA: Diagnosis not present

## 2022-01-19 DIAGNOSIS — R531 Weakness: Secondary | ICD-10-CM | POA: Diagnosis not present

## 2022-01-19 DIAGNOSIS — R2689 Other abnormalities of gait and mobility: Secondary | ICD-10-CM | POA: Diagnosis not present

## 2022-01-26 DIAGNOSIS — R2689 Other abnormalities of gait and mobility: Secondary | ICD-10-CM | POA: Diagnosis not present

## 2022-01-26 DIAGNOSIS — R531 Weakness: Secondary | ICD-10-CM | POA: Diagnosis not present

## 2022-01-26 DIAGNOSIS — R293 Abnormal posture: Secondary | ICD-10-CM | POA: Diagnosis not present

## 2022-01-26 DIAGNOSIS — M25552 Pain in left hip: Secondary | ICD-10-CM | POA: Diagnosis not present

## 2022-01-26 DIAGNOSIS — M545 Low back pain, unspecified: Secondary | ICD-10-CM | POA: Diagnosis not present

## 2022-01-29 DIAGNOSIS — M545 Low back pain, unspecified: Secondary | ICD-10-CM | POA: Diagnosis not present

## 2022-01-29 DIAGNOSIS — M25552 Pain in left hip: Secondary | ICD-10-CM | POA: Diagnosis not present

## 2022-01-29 DIAGNOSIS — R531 Weakness: Secondary | ICD-10-CM | POA: Diagnosis not present

## 2022-01-29 DIAGNOSIS — R2689 Other abnormalities of gait and mobility: Secondary | ICD-10-CM | POA: Diagnosis not present

## 2022-01-29 DIAGNOSIS — R293 Abnormal posture: Secondary | ICD-10-CM | POA: Diagnosis not present

## 2022-01-30 DIAGNOSIS — R251 Tremor, unspecified: Secondary | ICD-10-CM | POA: Diagnosis not present

## 2022-01-30 DIAGNOSIS — G4700 Insomnia, unspecified: Secondary | ICD-10-CM | POA: Diagnosis not present

## 2022-01-30 DIAGNOSIS — I10 Essential (primary) hypertension: Secondary | ICD-10-CM | POA: Diagnosis not present

## 2022-02-09 ENCOUNTER — Emergency Department (HOSPITAL_COMMUNITY)
Admission: EM | Admit: 2022-02-09 | Discharge: 2022-02-09 | Disposition: A | Discharge status: Home / Self Care | Payer: Medicare Other | Attending: Emergency Medicine | Admitting: Emergency Medicine

## 2022-02-09 ENCOUNTER — Encounter (HOSPITAL_COMMUNITY): Payer: Self-pay

## 2022-02-09 ENCOUNTER — Emergency Department (HOSPITAL_COMMUNITY): Payer: Medicare Other

## 2022-02-09 DIAGNOSIS — Z7984 Long term (current) use of oral hypoglycemic drugs: Secondary | ICD-10-CM | POA: Insufficient documentation

## 2022-02-09 DIAGNOSIS — S0093XA Contusion of unspecified part of head, initial encounter: Secondary | ICD-10-CM | POA: Insufficient documentation

## 2022-02-09 DIAGNOSIS — M545 Low back pain, unspecified: Secondary | ICD-10-CM | POA: Insufficient documentation

## 2022-02-09 DIAGNOSIS — I10 Essential (primary) hypertension: Secondary | ICD-10-CM | POA: Insufficient documentation

## 2022-02-09 DIAGNOSIS — S0990XA Unspecified injury of head, initial encounter: Secondary | ICD-10-CM | POA: Diagnosis present

## 2022-02-09 DIAGNOSIS — R519 Headache, unspecified: Secondary | ICD-10-CM

## 2022-02-09 DIAGNOSIS — W19XXXA Unspecified fall, initial encounter: Secondary | ICD-10-CM | POA: Insufficient documentation

## 2022-02-09 DIAGNOSIS — Z79899 Other long term (current) drug therapy: Secondary | ICD-10-CM | POA: Insufficient documentation

## 2022-02-09 DIAGNOSIS — M542 Cervicalgia: Secondary | ICD-10-CM | POA: Diagnosis not present

## 2022-02-09 LAB — URINALYSIS, ROUTINE W REFLEX MICROSCOPIC
Bilirubin Urine: NEGATIVE
Glucose, UA: >=500 mg/dL — AB
Hgb urine dipstick: NEGATIVE
Ketones, ur: NEGATIVE mg/dL
Nitrite: NEGATIVE
Protein, ur: NEGATIVE mg/dL
Specific Gravity, Urine: 1.006 (ref 1.005–1.030)
pH: 6 (ref 5.0–8.0)

## 2022-02-09 LAB — CBC WITH DIFFERENTIAL/PLATELET
Abs Immature Granulocytes: 0.03 10*3/uL (ref 0.00–0.07)
Basophils Absolute: 0.1 10*3/uL (ref 0.0–0.1)
Basophils Relative: 1 %
Eosinophils Absolute: 0.1 10*3/uL (ref 0.0–0.5)
Eosinophils Relative: 1 %
HCT: 46.1 % — ABNORMAL HIGH (ref 36.0–46.0)
Hemoglobin: 14.6 g/dL (ref 12.0–15.0)
Immature Granulocytes: 1 %
Lymphocytes Relative: 22 %
Lymphs Abs: 1.4 10*3/uL (ref 0.7–4.0)
MCH: 29.7 pg (ref 26.0–34.0)
MCHC: 31.7 g/dL (ref 30.0–36.0)
MCV: 93.7 fL (ref 80.0–100.0)
Monocytes Absolute: 0.7 10*3/uL (ref 0.1–1.0)
Monocytes Relative: 10 %
Neutro Abs: 4 10*3/uL (ref 1.7–7.7)
Neutrophils Relative %: 65 %
Platelets: 268 10*3/uL (ref 150–400)
RBC: 4.92 MIL/uL (ref 3.87–5.11)
RDW: 13.5 % (ref 11.5–15.5)
WBC: 6.3 10*3/uL (ref 4.0–10.5)
nRBC: 0 % (ref 0.0–0.2)

## 2022-02-09 LAB — COMPREHENSIVE METABOLIC PANEL
ALT: 29 U/L (ref 0–44)
AST: 23 U/L (ref 15–41)
Albumin: 3.9 g/dL (ref 3.5–5.0)
Alkaline Phosphatase: 83 U/L (ref 38–126)
Anion gap: 9 (ref 5–15)
BUN: 15 mg/dL (ref 8–23)
CO2: 30 mmol/L (ref 22–32)
Calcium: 9.1 mg/dL (ref 8.9–10.3)
Chloride: 101 mmol/L (ref 98–111)
Creatinine, Ser: 1.23 mg/dL — ABNORMAL HIGH (ref 0.44–1.00)
GFR, Estimated: 46 mL/min — ABNORMAL LOW (ref >60)
Glucose, Bld: 104 mg/dL — ABNORMAL HIGH (ref 70–99)
Potassium: 3.9 mmol/L (ref 3.5–5.1)
Sodium: 140 mmol/L (ref 135–145)
Total Bilirubin: 0.3 mg/dL (ref 0.3–1.2)
Total Protein: 7.4 g/dL (ref 6.5–8.1)

## 2022-02-09 MED ORDER — METOCLOPRAMIDE HCL 5 MG/ML IJ SOLN
10.0000 mg | Freq: Once | INTRAMUSCULAR | Status: AC
Start: 2022-02-09 — End: 2022-02-09
  Administered 2022-02-09: 10 mg via INTRAVENOUS
  Filled 2022-02-09: qty 2

## 2022-02-09 MED ORDER — SODIUM CHLORIDE 0.9 % IV BOLUS
500.0000 mL | Freq: Once | INTRAVENOUS | Status: AC
  Administered 2022-02-09: 500 mL via INTRAVENOUS

## 2022-02-09 NOTE — ED Triage Notes (Signed)
Pt states she woke up this morning with a BP of 949 systolic, as well as headache. Pt states she has had 6 falls in 2 months. Last fall last Tuesday. Pt states she feels off since then- "in another world", headache. Pt states left side of her head throbs- she did hit her head in that fall

## 2022-02-09 NOTE — Discharge Instructions (Signed)
You came to the emergency department today to be evaluated for your headache after suffering a fall.  Your lab results were reassuring.  The CT scan of your head did not show any acute intracranial abnormalities.  Please follow-up with your primary care doctor for further management of your blood pressure.  Get help right away if: Your headache: Becomes severe quickly. Gets worse after moderate to intense physical activity. You have any of these symptoms: Repeated vomiting. Pain or stiffness in your neck. Changes to your vision. Pain in an eye or ear. Problems with speech. Muscular weakness or loss of muscle control. Loss of balance or coordination. You feel faint or pass out. You have confusion. You have a seizure.

## 2022-02-09 NOTE — ED Provider Notes (Signed)
[facility] EMERGENCY DEPARTMENT Provider Note   CSN: [id number] Arrival date & time: 02/09/22  1226     History  Chief Complaint  Patient presents with   Hypertension   Fall    Chloe Golden is a 76 y.o. female with medical history of hypertension, fibromyalgia, rheumatoid arthritis, GERD, peripheral artery disease, neuropathy.  Presents to the emergency department with a chief complaint of fall, headache, and hypertension.  Patient reports that she suffered a fall on 02/03/2022 as well as a week before.  Patient states that 2 weeks ago she was pulling a weed when she lost her balance and fell backwards hitting the occipital region of her head.  Patient denies any loss conscious on that episode.  Patient states that last week she got out of bed and tripped over her feet causing her to fall forward.  Patient endorses hitting her head and the left shoulder on a chair.  Again patient denied any loss of consciousness.  Patient states that since that fall she has had a headache throughout her entire head.  Headache has been constant over this time.  Patient denies any aggravating or alleviating factors.  Patient states that she has chronic back pain however feels that pain is worse after her fall last week.  Patient also complains of pain to left shoulder since her fall last week.  Patient reports that she noticed that her blood pressure was elevated with a systolic BP in the 735H while she was at physical therapy.  Patient states that her blood pressure is normally never that high.  Patient reports taking her antihypertensive medication as prescribed.  Patient last had this medication earlier this morning.  Denies any numbness, weakness, facial asymmetry, dysarthria, diplopia, vision loss, saddle anesthesia, bowel/bladder dysfunction, vomiting.   Hypertension Associated symptoms include headaches. Pertinent negatives include no chest pain, no abdominal pain and no shortness of breath.   Fall Associated symptoms include headaches. Pertinent negatives include no chest pain, no abdominal pain and no shortness of breath.       Home Medications Prior to Admission medications   Medication Sig Start Date End Date Taking? Authorizing Provider  ACCU-CHEK GUIDE test strip See admin instructions. 12/12/20   [provider]  ALPRAZolam Duanne Moron) 0.5 MG tablet Take 0.5-1 mg by mouth See admin instructions. 0.5 mg in the morning, 1 mg at bedtime    [provider]  amLODipine (NORVASC) 5 MG tablet Take 5 mg by mouth daily. 09/20/21   [provider]  baclofen (LIORESAL) 10 MG tablet Take 10 mg by mouth 2 (two) times daily. 05/04/20   [provider]  Coenzyme Q10 (COQ-10) 100 MG CAPS Take 100 mg by mouth See admin instructions. Twice a day on Monday, Wednesday and Friday and 1 capsule all other days.    [provider]  conjugated estrogens (PREMARIN) vaginal cream Place 1 applicator vaginally See admin instructions. Twice a week as directed    [provider]  donepezil (ARICEPT) 10 MG tablet Take 10 mg by mouth daily. 07/24/20   [provider]  ezetimibe (ZETIA) 10 MG tablet Take 10 mg by mouth at bedtime.  10/20/16   [provider]  furosemide (LASIX) 20 MG tablet Take 20 mg by mouth daily. 05/04/20   [provider]  GEMTESA 75 MG TABS Take 75 mg by mouth daily. 01/01/21   [provider]  hydroxychloroquine (PLAQUENIL) 200 MG tablet Take 400 mg by mouth daily. 01/02/21   [provider]  JANUVIA 100 MG tablet Take 100 mg by mouth daily. 05/04/20   [provider]  JARDIANCE 10 MG TABS tablet Take 10 mg by mouth daily. 05/03/21   [provider]  leflunomide (ARAVA) 10 MG tablet Take 10 mg by mouth daily. 09/22/21   [provider]  losartan-hydrochlorothiazide (HYZAAR) 100-12.5 MG tablet Take 1 tablet by mouth daily.    [provider]  LYRICA 50 MG capsule  Take 50 mg by mouth 2 (two) times daily. 04/15/17   [provider]  meclizine (ANTIVERT) 25 MG tablet Take 25 mg by mouth 3 (three) times daily as needed for dizziness.    [provider]  Multiple Vitamins-Minerals (MULTIVITAMIN WITH MINERALS) tablet Take 1 tablet by mouth daily.    [provider]  nebivolol (BYSTOLIC) 5 MG tablet Take 5 mg by mouth at bedtime.    [provider]  Omega-3 Fatty Acids (FISH OIL) 1000 MG CAPS Take 1,000 mg by mouth daily.    [provider]  ondansetron (ZOFRAN) 4 MG tablet Take 1 tablet (4 mg total) by mouth every 6 (six) hours. Patient taking differently: Take 4 mg by mouth every 6 (six) hours as needed for nausea or vomiting. 05/13/21   Azucena Cecil, PA-C  potassium chloride (KLOR-CON) 10 MEQ tablet Take 10 mEq by mouth daily. 09/20/21   [provider]  RABEprazole (ACIPHEX) 20 MG tablet Take 1 tablet (20 mg total) by mouth in the morning and at bedtime. 11/20/21 05/19/22  Eloise Harman, DO  rOPINIRole (REQUIP) 0.5 MG tablet Take 1.5 mg by mouth at bedtime. 05/04/20   [provider]  simvastatin (ZOCOR) 20 MG tablet Take 20 mg by mouth every Monday, Wednesday, and Friday. 05/03/21   [provider]  solifenacin (VESICARE) 5 MG tablet Take 5 mg by mouth daily. 09/23/21   [provider]  traZODone (DESYREL) 100 MG tablet Take 100 mg by mouth at bedtime. 04/15/17   [provider]  TURMERIC CURCUMIN PO Take 1,000 mg by mouth daily.    [provider]  vortioxetine HBr (TRINTELLIX) 10 MG TABS tablet Take 10 mg by mouth daily.    [provider]      Allergies    Codeine, Dicyclomine, Ibuprofen, Sulfa antibiotics, and Valium [diazepam]    Review of Systems   Review of Systems  Constitutional:  Negative for chills and fever.  Eyes:  Negative for visual disturbance.  Respiratory:  Negative for shortness of breath.   Cardiovascular:  Negative for  chest pain.  Gastrointestinal:  Negative for abdominal pain, nausea and vomiting.  Genitourinary:  Negative for difficulty urinating and dysuria.  Musculoskeletal:  Positive for arthralgias and back pain. Negative for neck pain.  Skin:  Negative for color change and rash.  Neurological:  Positive for headaches. Negative for dizziness, tremors, seizures, syncope, facial asymmetry, speech difficulty, weakness, light-headedness and numbness.  Psychiatric/Behavioral:  Negative for confusion.     Physical Exam Updated Vital Signs BP (!) 146/78 (BP Location: Left Arm)   Pulse 90   Temp 97.8 F (36.6 C) (Oral)   Resp 20   Ht '5\' 4"'$  (1.626 m)   Wt 86.2 kg   SpO2 97%   BMI 32.61 kg/m  Physical Exam Vitals and nursing note reviewed.  Constitutional:      General: She is not in acute distress.    Appearance: She is not ill-appearing, toxic-appearing or diaphoretic.  HENT:  Head: Contusion present. No raccoon eyes, Battle's sign, abrasion, right periorbital erythema, left periorbital erythema or laceration.     Jaw: No trismus, tenderness, swelling, pain on movement or malocclusion.   Eyes:     General:        Right eye: No discharge.        Left eye: No discharge.     Extraocular Movements: Extraocular movements intact.     Conjunctiva/sclera: Conjunctivae normal.     Pupils: Pupils are equal, round, and reactive to light.  Cardiovascular:     Rate and Rhythm: Normal rate.  Pulmonary:     Effort: Pulmonary effort is normal.  Musculoskeletal:     Right shoulder: No swelling, deformity, effusion, laceration, tenderness, bony tenderness or crepitus. Normal range of motion.     Left shoulder: No swelling, deformity, effusion, laceration, tenderness, bony tenderness or crepitus. Normal range of motion.     Right upper arm: Normal.     Left upper arm: Normal.     Cervical back: Normal range of motion and neck supple. Tenderness present. No swelling, edema, deformity, erythema, signs of  trauma, lacerations, rigidity, spasms, torticollis, bony tenderness or crepitus. No pain with movement. Normal range of motion.     Thoracic back: No swelling, edema, deformity, signs of trauma, lacerations, spasms, tenderness or bony tenderness.     Lumbar back: Tenderness present. No swelling, edema, deformity, signs of trauma, lacerations, spasms or bony tenderness.       Back:     Comments: No midline tenderness or deformity to cervical, thoracic, or lumbar spine.  Minimal tenderness to bilateral lumbar back.  Minimal tenderness to left trapezius muscle.  Kyphosis of cervical/thoracic back.  Full active range of motion to left shoulder without difficulty or complaints of pain.  No tenderness or deformity to bilateral clavicle bones.  Skin:    General: Skin is warm and dry.  Neurological:     General: No focal deficit present.     Mental Status: She is alert and oriented to person, place, and time.     GCS: GCS eye subscore is 4. GCS verbal subscore is 5. GCS motor subscore is 6.     Cranial Nerves: No cranial nerve deficit or facial asymmetry.     Sensory: Sensation is intact.     Motor: No weakness, tremor, seizure activity or pronator drift.     Coordination: Finger-Nose-Finger Test normal.     Gait: Gait is intact. Gait normal.     Comments: CN II-XII intact, equal grip strength, +5 strength to bilateral upper and lower extremities, sensation to light touch grossly intact to bilateral upper and lower extremities.  Psychiatric:        Behavior: Behavior is cooperative.     ED Results / Procedures / Treatments   Labs (all labs ordered are listed, but only abnormal results are displayed) Labs Reviewed  CBC WITH DIFFERENTIAL/PLATELET - Abnormal; Notable for the following components:      Result Value   HCT 46.1 (*)    All other components within normal limits  COMPREHENSIVE METABOLIC PANEL - Abnormal; Notable for the following components:   Glucose, Bld 104 (*)    Creatinine, Ser  1.23 (*)    GFR, Estimated 46 (*)    All other components within normal limits  URINALYSIS, ROUTINE W REFLEX MICROSCOPIC - Abnormal; Notable for the following components:   Glucose, UA >=500 (*)    Leukocytes,Ua TRACE (*)    Bacteria, UA RARE (*)  All other components within normal limits    EKG None  Radiology CT Head Wo Contrast  Result Date: 02/09/2022 CLINICAL DATA:  Headache, new or worsening. Multiple fall for last 2 months. EXAM: CT HEAD WITHOUT CONTRAST TECHNIQUE: Contiguous axial images were obtained from the base of the skull through the vertex without intravenous contrast. RADIATION DOSE REDUCTION: This exam was performed according to the departmental dose-optimization program which includes automated exposure control, adjustment of the mA and/or kV according to patient size and/or use of iterative reconstruction technique. COMPARISON:  CT examination dated January 05, 2022 FINDINGS: Brain: No evidence of acute infarction, hemorrhage, hydrocephalus, extra-axial collection or mass lesion/mass effect. Vascular: No hyperdense vessel or unexpected calcification. Skull: Normal. Negative for fracture or focal lesion. Sinuses/Orbits: No acute finding. Other: None. IMPRESSION: No acute intracranial abnormality. Electronically Signed   By: Keane Police D.O.   On: 02/09/2022 14:07    Procedures Procedures    Medications Ordered in ED Medications - No data to display  ED Course/ Medical Decision Making/ A&P                           Medical Decision Making Amount and/or Complexity of Data Reviewed Labs: ordered. Radiology: ordered.  Risk Prescription drug management.   Alert 76 year old female in no acute distress, nontoxic-appearing.  Presents to the emergency department with a chief complaint of fall, headache, and hypertension.  Information obtained from patient and patient's husband at bedside.  I reviewed patient's past medical records including previous phone notes, labs,  and imaging.  Patient has medical history as outlined in HPI which complicates her care.  Due to persistent headache in the setting of head injury will obtain noncontrast head CT to evaluate for possible skull fracture or intracranial hemorrhage.  Additionally will check basic lab work.  No midline tenderness or deformity to cervical, thoracic, or lumbar spine.  Neuro exam is reassuring.  Patient denies any saddle anesthesia, bowel/bladder dysfunction.  Low suspicion for cauda equina syndrome at this time.  With no midline tenderness or deformity, low suspicion for acute osseous abnormality at this time.  I personally viewed and interpret patient's lab results.  Pertinent findings include: -Creatinine 1.23, slightly elevated from 1.15 three months prior. -CBC unremarkable  -UA shows no signs of infection   I personally viewed interpret patient CT imaging.  Agree with radiology interpretation of no acute cranial abnormality.  Due to reports of patient persistent headache will administer migraine cocktail.  Plan to reassess after receiving this medication.  Patient care discussed with attending physician Dr. Ashok Cordia.    Patient has improvement in headache after receiving migraine cocktail.  Continues to have no focal neurological deficits.  Will discharge patient at this time to follow-up with PCP for further management of her hypertension. Based on patient's chief complaint, I considered admission might be necessary, however after reassuring ED workup feel patient is reasonable for discharge.  Discussed results, findings, treatment and follow up. Patient advised of return precautions. Patient verbalized understanding and agreed with plan.  Portions of this note were generated with [employer]. Dictation errors may occur despite best attempts at proofreading.          Final Clinical Impression(s) / ED Diagnoses Final diagnoses:  None    Rx / DC Orders ED Discharge  Orders     None         Loni Beckwith, PA-C 02/09/22 1602    Ashok Cordia,  Lennette Bihari, MD 02/10/22 2174

## 2022-02-09 NOTE — ED Notes (Signed)
Patient transported to CT 

## 2022-02-12 DIAGNOSIS — I10 Essential (primary) hypertension: Secondary | ICD-10-CM | POA: Diagnosis not present

## 2022-02-12 DIAGNOSIS — R293 Abnormal posture: Secondary | ICD-10-CM | POA: Diagnosis not present

## 2022-02-12 DIAGNOSIS — M25552 Pain in left hip: Secondary | ICD-10-CM | POA: Diagnosis not present

## 2022-02-12 DIAGNOSIS — R2689 Other abnormalities of gait and mobility: Secondary | ICD-10-CM | POA: Diagnosis not present

## 2022-02-12 DIAGNOSIS — R531 Weakness: Secondary | ICD-10-CM | POA: Diagnosis not present

## 2022-02-12 DIAGNOSIS — J302 Other seasonal allergic rhinitis: Secondary | ICD-10-CM | POA: Diagnosis not present

## 2022-02-12 DIAGNOSIS — R296 Repeated falls: Secondary | ICD-10-CM | POA: Diagnosis not present

## 2022-02-12 DIAGNOSIS — M545 Low back pain, unspecified: Secondary | ICD-10-CM | POA: Diagnosis not present

## 2022-02-12 DIAGNOSIS — W19XXXD Unspecified fall, subsequent encounter: Secondary | ICD-10-CM | POA: Diagnosis not present

## 2022-02-16 ENCOUNTER — Telehealth: Payer: Self-pay | Admitting: *Deleted

## 2022-02-16 DIAGNOSIS — G4459 Other complicated headache syndrome: Secondary | ICD-10-CM | POA: Diagnosis not present

## 2022-02-16 DIAGNOSIS — R296 Repeated falls: Secondary | ICD-10-CM | POA: Diagnosis not present

## 2022-02-16 DIAGNOSIS — M542 Cervicalgia: Secondary | ICD-10-CM | POA: Diagnosis not present

## 2022-02-16 DIAGNOSIS — G603 Idiopathic progressive neuropathy: Secondary | ICD-10-CM | POA: Diagnosis not present

## 2022-02-16 DIAGNOSIS — R42 Dizziness and giddiness: Secondary | ICD-10-CM | POA: Diagnosis not present

## 2022-02-16 NOTE — Patient Outreach (Signed)
  Care Coordination   02/16/2022 Name: Chloe Golden MRN: [id number] DOB: [birth year]   Care Coordination Outreach Attempts:  An unsuccessful telephone outreach was attempted today to offer the patient information about available care coordination services as a benefit of their health plan.   Follow Up Plan:  Additional outreach attempts will be made to offer the patient care coordination information and services.   Encounter Outcome:  No Answer  Care Coordination Interventions Activated:  Yes   Care Coordination Interventions:  No, not indicated    [facility] [phone]

## 2022-02-18 DIAGNOSIS — H02403 Unspecified ptosis of bilateral eyelids: Secondary | ICD-10-CM | POA: Diagnosis not present

## 2022-02-19 DIAGNOSIS — M25552 Pain in left hip: Secondary | ICD-10-CM | POA: Diagnosis not present

## 2022-02-19 DIAGNOSIS — R293 Abnormal posture: Secondary | ICD-10-CM | POA: Diagnosis not present

## 2022-02-19 DIAGNOSIS — R2689 Other abnormalities of gait and mobility: Secondary | ICD-10-CM | POA: Diagnosis not present

## 2022-02-19 DIAGNOSIS — R531 Weakness: Secondary | ICD-10-CM | POA: Diagnosis not present

## 2022-02-19 DIAGNOSIS — M545 Low back pain, unspecified: Secondary | ICD-10-CM | POA: Diagnosis not present

## 2022-02-25 DIAGNOSIS — M545 Low back pain, unspecified: Secondary | ICD-10-CM | POA: Diagnosis not present

## 2022-02-25 DIAGNOSIS — R293 Abnormal posture: Secondary | ICD-10-CM | POA: Diagnosis not present

## 2022-02-25 DIAGNOSIS — R2689 Other abnormalities of gait and mobility: Secondary | ICD-10-CM | POA: Diagnosis not present

## 2022-02-25 DIAGNOSIS — M25552 Pain in left hip: Secondary | ICD-10-CM | POA: Diagnosis not present

## 2022-02-25 DIAGNOSIS — R531 Weakness: Secondary | ICD-10-CM | POA: Diagnosis not present

## 2022-02-26 ENCOUNTER — Telehealth: Payer: Self-pay | Admitting: *Deleted

## 2022-02-26 NOTE — Patient Outreach (Signed)
  Care Coordination   Initial Visit Note   02/26/2022 Name: Chloe Golden MRN: [id number] DOB: 10/20/1945  Chloe Golden is a 76 y.o. year old female who sees Chloe Golden, Chloe Areola, MD for primary care. I spoke with  Chloe Golden by phone today.  What matters to the patients health and wellness today?  No concerns expressed    Goals Addressed   None     SDOH assessments and interventions completed:  No     Care Coordination Interventions Activated:  Yes  Care Coordination Interventions:  No, not indicated   Follow up plan: No further intervention required.   Encounter Outcome:  Pt. Chloe Golden Care Management [phone]

## 2022-02-27 DIAGNOSIS — R293 Abnormal posture: Secondary | ICD-10-CM | POA: Diagnosis not present

## 2022-02-27 DIAGNOSIS — R2689 Other abnormalities of gait and mobility: Secondary | ICD-10-CM | POA: Diagnosis not present

## 2022-02-27 DIAGNOSIS — R531 Weakness: Secondary | ICD-10-CM | POA: Diagnosis not present

## 2022-02-27 DIAGNOSIS — M25552 Pain in left hip: Secondary | ICD-10-CM | POA: Diagnosis not present

## 2022-02-27 DIAGNOSIS — M545 Low back pain, unspecified: Secondary | ICD-10-CM | POA: Diagnosis not present

## 2022-03-04 DIAGNOSIS — M545 Low back pain, unspecified: Secondary | ICD-10-CM | POA: Diagnosis not present

## 2022-03-04 DIAGNOSIS — R531 Weakness: Secondary | ICD-10-CM | POA: Diagnosis not present

## 2022-03-04 DIAGNOSIS — R293 Abnormal posture: Secondary | ICD-10-CM | POA: Diagnosis not present

## 2022-03-04 DIAGNOSIS — M25552 Pain in left hip: Secondary | ICD-10-CM | POA: Diagnosis not present

## 2022-03-04 DIAGNOSIS — R2689 Other abnormalities of gait and mobility: Secondary | ICD-10-CM | POA: Diagnosis not present

## 2022-03-05 DIAGNOSIS — R350 Frequency of micturition: Secondary | ICD-10-CM | POA: Diagnosis not present

## 2022-03-06 DIAGNOSIS — R293 Abnormal posture: Secondary | ICD-10-CM | POA: Diagnosis not present

## 2022-03-06 DIAGNOSIS — M25552 Pain in left hip: Secondary | ICD-10-CM | POA: Diagnosis not present

## 2022-03-06 DIAGNOSIS — M545 Low back pain, unspecified: Secondary | ICD-10-CM | POA: Diagnosis not present

## 2022-03-06 DIAGNOSIS — R2689 Other abnormalities of gait and mobility: Secondary | ICD-10-CM | POA: Diagnosis not present

## 2022-03-06 DIAGNOSIS — R531 Weakness: Secondary | ICD-10-CM | POA: Diagnosis not present

## 2022-03-09 ENCOUNTER — Encounter: Payer: Self-pay | Admitting: *Deleted

## 2022-03-09 DIAGNOSIS — R2689 Other abnormalities of gait and mobility: Secondary | ICD-10-CM | POA: Diagnosis not present

## 2022-03-09 DIAGNOSIS — M545 Low back pain, unspecified: Secondary | ICD-10-CM | POA: Diagnosis not present

## 2022-03-09 DIAGNOSIS — R293 Abnormal posture: Secondary | ICD-10-CM | POA: Diagnosis not present

## 2022-03-09 DIAGNOSIS — M25552 Pain in left hip: Secondary | ICD-10-CM | POA: Diagnosis not present

## 2022-03-09 DIAGNOSIS — R531 Weakness: Secondary | ICD-10-CM | POA: Diagnosis not present

## 2022-03-16 DIAGNOSIS — R531 Weakness: Secondary | ICD-10-CM | POA: Diagnosis not present

## 2022-03-16 DIAGNOSIS — R293 Abnormal posture: Secondary | ICD-10-CM | POA: Diagnosis not present

## 2022-03-16 DIAGNOSIS — R2689 Other abnormalities of gait and mobility: Secondary | ICD-10-CM | POA: Diagnosis not present

## 2022-03-16 DIAGNOSIS — M545 Low back pain, unspecified: Secondary | ICD-10-CM | POA: Diagnosis not present

## 2022-03-16 DIAGNOSIS — M25552 Pain in left hip: Secondary | ICD-10-CM | POA: Diagnosis not present

## 2022-03-17 DIAGNOSIS — M0609 Rheumatoid arthritis without rheumatoid factor, multiple sites: Secondary | ICD-10-CM | POA: Diagnosis not present

## 2022-03-17 DIAGNOSIS — K219 Gastro-esophageal reflux disease without esophagitis: Secondary | ICD-10-CM | POA: Diagnosis not present

## 2022-03-17 DIAGNOSIS — Z79899 Other long term (current) drug therapy: Secondary | ICD-10-CM | POA: Diagnosis not present

## 2022-03-17 DIAGNOSIS — M797 Fibromyalgia: Secondary | ICD-10-CM | POA: Diagnosis not present

## 2022-03-17 DIAGNOSIS — M79644 Pain in right finger(s): Secondary | ICD-10-CM | POA: Diagnosis not present

## 2022-03-17 DIAGNOSIS — M1990 Unspecified osteoarthritis, unspecified site: Secondary | ICD-10-CM | POA: Diagnosis not present

## 2022-03-19 DIAGNOSIS — E782 Mixed hyperlipidemia: Secondary | ICD-10-CM | POA: Diagnosis not present

## 2022-03-19 DIAGNOSIS — I10 Essential (primary) hypertension: Secondary | ICD-10-CM | POA: Diagnosis not present

## 2022-03-19 DIAGNOSIS — E1165 Type 2 diabetes mellitus with hyperglycemia: Secondary | ICD-10-CM | POA: Diagnosis not present

## 2022-04-01 ENCOUNTER — Other Ambulatory Visit (HOSPITAL_COMMUNITY): Payer: Self-pay | Admitting: Internal Medicine

## 2022-04-01 DIAGNOSIS — Z1231 Encounter for screening mammogram for malignant neoplasm of breast: Secondary | ICD-10-CM

## 2022-04-08 DIAGNOSIS — W19XXXD Unspecified fall, subsequent encounter: Secondary | ICD-10-CM | POA: Diagnosis not present

## 2022-04-08 DIAGNOSIS — E782 Mixed hyperlipidemia: Secondary | ICD-10-CM | POA: Diagnosis not present

## 2022-04-08 DIAGNOSIS — R251 Tremor, unspecified: Secondary | ICD-10-CM | POA: Diagnosis not present

## 2022-04-08 DIAGNOSIS — M069 Rheumatoid arthritis, unspecified: Secondary | ICD-10-CM | POA: Diagnosis not present

## 2022-04-08 DIAGNOSIS — K219 Gastro-esophageal reflux disease without esophagitis: Secondary | ICD-10-CM | POA: Diagnosis not present

## 2022-04-08 DIAGNOSIS — J302 Other seasonal allergic rhinitis: Secondary | ICD-10-CM | POA: Diagnosis not present

## 2022-04-08 DIAGNOSIS — G4700 Insomnia, unspecified: Secondary | ICD-10-CM | POA: Diagnosis not present

## 2022-04-08 DIAGNOSIS — R944 Abnormal results of kidney function studies: Secondary | ICD-10-CM | POA: Diagnosis not present

## 2022-04-08 DIAGNOSIS — I10 Essential (primary) hypertension: Secondary | ICD-10-CM | POA: Diagnosis not present

## 2022-04-08 DIAGNOSIS — E1165 Type 2 diabetes mellitus with hyperglycemia: Secondary | ICD-10-CM | POA: Diagnosis not present

## 2022-04-13 DIAGNOSIS — G4700 Insomnia, unspecified: Secondary | ICD-10-CM | POA: Diagnosis not present

## 2022-04-13 DIAGNOSIS — R42 Dizziness and giddiness: Secondary | ICD-10-CM | POA: Diagnosis not present

## 2022-04-13 DIAGNOSIS — M542 Cervicalgia: Secondary | ICD-10-CM | POA: Diagnosis not present

## 2022-04-13 DIAGNOSIS — G4459 Other complicated headache syndrome: Secondary | ICD-10-CM | POA: Diagnosis not present

## 2022-04-13 DIAGNOSIS — G603 Idiopathic progressive neuropathy: Secondary | ICD-10-CM | POA: Diagnosis not present

## 2022-04-16 ENCOUNTER — Ambulatory Visit: Payer: Medicaid Other | Admitting: Internal Medicine

## 2022-04-17 ENCOUNTER — Other Ambulatory Visit: Payer: Self-pay | Admitting: Orthopedic Surgery

## 2022-04-17 DIAGNOSIS — M180 Bilateral primary osteoarthritis of first carpometacarpal joints: Secondary | ICD-10-CM | POA: Diagnosis not present

## 2022-04-28 DIAGNOSIS — R350 Frequency of micturition: Secondary | ICD-10-CM | POA: Diagnosis not present

## 2022-04-29 ENCOUNTER — Ambulatory Visit: Payer: Medicaid Other | Admitting: Internal Medicine

## 2022-04-30 DIAGNOSIS — R350 Frequency of micturition: Secondary | ICD-10-CM | POA: Diagnosis not present

## 2022-04-30 DIAGNOSIS — N3000 Acute cystitis without hematuria: Secondary | ICD-10-CM | POA: Diagnosis not present

## 2022-04-30 DIAGNOSIS — R8271 Bacteriuria: Secondary | ICD-10-CM | POA: Diagnosis not present

## 2022-05-06 DIAGNOSIS — R350 Frequency of micturition: Secondary | ICD-10-CM | POA: Diagnosis not present

## 2022-05-07 DIAGNOSIS — R42 Dizziness and giddiness: Secondary | ICD-10-CM | POA: Diagnosis not present

## 2022-05-07 DIAGNOSIS — G4700 Insomnia, unspecified: Secondary | ICD-10-CM | POA: Diagnosis not present

## 2022-05-07 DIAGNOSIS — M542 Cervicalgia: Secondary | ICD-10-CM | POA: Diagnosis not present

## 2022-05-07 DIAGNOSIS — R296 Repeated falls: Secondary | ICD-10-CM | POA: Diagnosis not present

## 2022-05-07 DIAGNOSIS — G603 Idiopathic progressive neuropathy: Secondary | ICD-10-CM | POA: Diagnosis not present

## 2022-05-07 DIAGNOSIS — G4459 Other complicated headache syndrome: Secondary | ICD-10-CM | POA: Diagnosis not present

## 2022-05-18 ENCOUNTER — Ambulatory Visit (HOSPITAL_COMMUNITY)
Admission: RE | Admit: 2022-05-18 | Discharge: 2022-05-18 | Disposition: A | Discharge status: Home / Self Care | Payer: Medicare Other | Source: Ambulatory Visit | Attending: Internal Medicine | Admitting: Internal Medicine

## 2022-05-18 DIAGNOSIS — Z1231 Encounter for screening mammogram for malignant neoplasm of breast: Secondary | ICD-10-CM | POA: Diagnosis not present

## 2022-05-20 ENCOUNTER — Other Ambulatory Visit: Payer: Self-pay | Admitting: Internal Medicine

## 2022-05-25 NOTE — Telephone Encounter (Signed)
Pt needs RF on her Rabeprazole Sodium. Her last ov was 11/20/2021

## 2022-05-26 DIAGNOSIS — N3000 Acute cystitis without hematuria: Secondary | ICD-10-CM | POA: Diagnosis not present

## 2022-06-02 ENCOUNTER — Encounter: Payer: Self-pay | Admitting: Gastroenterology

## 2022-06-02 ENCOUNTER — Ambulatory Visit (INDEPENDENT_AMBULATORY_CARE_PROVIDER_SITE_OTHER): Payer: Medicare Other | Admitting: Gastroenterology

## 2022-06-02 VITALS — BP 95/66 | HR 89 | Temp 97.5°F | Ht 64.0 in | Wt 173.2 lb

## 2022-06-02 DIAGNOSIS — G2581 Restless legs syndrome: Secondary | ICD-10-CM | POA: Diagnosis not present

## 2022-06-02 DIAGNOSIS — G4733 Obstructive sleep apnea (adult) (pediatric): Secondary | ICD-10-CM | POA: Diagnosis not present

## 2022-06-02 DIAGNOSIS — K219 Gastro-esophageal reflux disease without esophagitis: Secondary | ICD-10-CM

## 2022-06-02 DIAGNOSIS — F5101 Primary insomnia: Secondary | ICD-10-CM | POA: Diagnosis not present

## 2022-06-02 DIAGNOSIS — K5900 Constipation, unspecified: Secondary | ICD-10-CM | POA: Diagnosis not present

## 2022-06-02 MED ORDER — LUBIPROSTONE 8 MCG PO CAPS: 8.0000 ug | ORAL_CAPSULE | Freq: Two times a day (BID) | ORAL | 3 refills | Status: DC

## 2022-06-02 NOTE — Patient Instructions (Signed)
Continue Aciphex twice a day.  For constipation, start taking Benefiber 2 teaspoons with the beverage of your choice daily. If no improvement, I have sent in Amitiza to the pharmacy. Start off taking this one gelcap a day WITH FOOD to avoid nausea. You can take it twice a day with food once you see you are tolerating it.  We will see you in 3 months!  It was a pleasure to see you today. I want to create trusting relationships with patients to provide genuine, compassionate, and quality care. I value your feedback. If you receive a survey regarding your visit,  I greatly appreciate you taking time to fill this out.   Annitta Needs, PhD, ANP-BC [facility]

## 2022-06-02 NOTE — Progress Notes (Signed)
Gastroenterology Office Note     Primary Care Physician:  Celene Squibb, MD  Primary Gastroenterologist: Dr. Abbey Chatters    Chief Complaint   Chief Complaint  Patient presents with   Follow-up    Patient here today for a follow up [facility]. Patient takes Rabeprazole 20 mg bid. She says she has had a decreased appetite as of late.     History of Present Illness   Chloe Golden is a 76 y.o. female presenting today in follow-up with a history of  IBS, diarrhea predominant.  Underwent colonoscopy 10/20/2021 with multiple tubular adenomas removed, random colon biopsies negative for microscopic colitis.  Recommended 3-year recall. Duodenal biopsies in the past negative for celiac disease.  Also with chronic GERD and dysphagia. Dysphagia chronic, not improved status post esophageal dilation. Esophageal dysmotility seen on previous esophagram. Changed from Protonix to Aciphex BID at last visit. Nexium did not help in past. Previously on Dexilant many years. She has done quite well with Aciphex BID.  EGD Jan 2023: gastritis s/p biopsy, normal duodenum. Negative H.pylori. dilation.    Decreased appetite over past 2-3 months. Good breakfast. Eats another meal after this.  Yoghurt at night. Cut out junk food. Diabetic and changing diet. Came off Lyrica and noticed losing weight after this. No abdominal pain. Now feeling constipated instead of diarrhea. BM twice per week. No imodium. Has taken Linzess 72 mcg in past but makes her nervous  She was 190 in Aug 2023. Now 173.      Past Medical History:  Diagnosis Date   Acid reflux    Arthritis    Degenerative arthritis of cervical spine    Fibromyalgia    Functional gastrointestinal disorder    GERD (gastroesophageal reflux disease)    Glaucoma    Hiatal hernia    Hypertension    Neuropathy    OSA on CPAP    PAD (peripheral artery disease) (HCC)    Mild by arterial Dopplers April 2019   Rheumatoid arthritis(714.0)    Venous reflux      Past Surgical History:  Procedure Laterality Date   ABDOMINAL HYSTERECTOMY     BACTERIAL OVERGROWTH TEST N/A 11/09/2012   Procedure: BACTERIAL OVERGROWTH TEST;  Surgeon: Danie Binder, MD;  [facility];  Service: Endoscopy;  Laterality: N/A;  7:30   BALLOON DILATION N/A 07/07/2021   Procedure: BALLOON DILATION;  Surgeon: Eloise Harman, DO;  [facility];  Service: Endoscopy;  Laterality: N/A;   BIOPSY N/A 10/28/2012   Procedure: BIOPSY;  Surgeon: Danie Binder, MD;  [facility];  Service: Endoscopy;  Laterality: N/A;  DUODENAL BIOPSY   BIOPSY  07/07/2021   Procedure: BIOPSY;  Surgeon: Eloise Harman, DO;  [facility];  Service: Endoscopy;;   BIOPSY  10/20/2021   Procedure: BIOPSY;  Surgeon: Eloise Harman, DO;  [facility];  Service: Endoscopy;;   BLADDER SURGERY     CARPAL TUNNEL RELEASE Bilateral    CHOLECYSTECTOMY     COLONOSCOPY WITH ESOPHAGOGASTRODUODENOSCOPY (EGD) N/A 10/28/2012   MBW:[id number] diverticulosis in the descending colon and sigmoid colon/Small internal hemorrhoids   COLONOSCOPY WITH PROPOFOL Left 10/20/2021   Procedure: COLONOSCOPY WITH PROPOFOL;  Surgeon: Eloise Harman, DO;  [facility];  Service: Endoscopy;  Laterality: Left;  7:30am   ESOPHAGOGASTRODUODENOSCOPY N/A 11/27/2016   Procedure: ESOPHAGOGASTRODUODENOSCOPY (EGD);  Surgeon: Danie Binder, MD;  [facility];  Service:  Endoscopy;  Laterality: N/A;  830    ESOPHAGOGASTRODUODENOSCOPY (EGD) WITH ESOPHAGEAL DILATION N/A 10/28/2012   QMG:[id number] CERVIAL WEB, S/P DILATION/MILD Non-erosive gastritis & DUODENITIS   ESOPHAGOGASTRODUODENOSCOPY (EGD) WITH PROPOFOL N/A 07/07/2021   Procedure: ESOPHAGOGASTRODUODENOSCOPY (EGD) WITH PROPOFOL;  Surgeon: Eloise Harman, DO;  [facility];  Service: Endoscopy;  Laterality: N/A;  9:30am   POLYPECTOMY  10/20/2021   Procedure: POLYPECTOMY;  Surgeon: Eloise Harman, DO;   [facility];  Service: Endoscopy;;   SAVORY DILATION N/A 11/27/2016   Procedure: SAVORY DILATION;  Surgeon: Danie Binder, MD;  [facility];  Service: Endoscopy;  Laterality: N/A;   TOTAL KNEE ARTHROPLASTY Right     Current Outpatient Medications  Medication Sig Dispense Refill   ACCU-CHEK GUIDE test strip See admin instructions.     ALPRAZolam (XANAX) 0.5 MG tablet Take 0.5-1 mg by mouth See admin instructions. 0.5 mg in the morning, 1 mg at bedtime     amLODipine (NORVASC) 5 MG tablet Take 5 mg by mouth daily.     baclofen (LIORESAL) 10 MG tablet Take 10 mg by mouth 2 (two) times daily.     Coenzyme Q10 (COQ-10) 100 MG CAPS Take 100 mg by mouth daily at 6 (six) AM.     conjugated estrogens (PREMARIN) vaginal cream Place 1 applicator vaginally See admin instructions. Twice a week as directed     donepezil (ARICEPT) 10 MG tablet Take 10 mg by mouth daily.     ezetimibe (ZETIA) 10 MG tablet Take 10 mg by mouth at bedtime.      furosemide (LASIX) 20 MG tablet Take 20 mg by mouth daily.     gabapentin (NEURONTIN) 100 MG capsule Take 100 mg by mouth 2 (two) times daily.     GEMTESA 75 MG TABS Take 75 mg by mouth daily.     hydroxychloroquine (PLAQUENIL) 200 MG tablet Take 400 mg by mouth daily.     JANUVIA 100 MG tablet Take 100 mg by mouth daily.     JARDIANCE 10 MG TABS tablet Take 10 mg by mouth daily.     leflunomide (ARAVA) 10 MG tablet Take 10 mg by mouth daily.     losartan-hydrochlorothiazide (HYZAAR) 100-12.5 MG tablet Take 1 tablet by mouth daily.     meclizine (ANTIVERT) 25 MG tablet Take 25 mg by mouth 3 (three) times daily as needed for dizziness.     Multiple Vitamins-Minerals (MULTIVITAMIN WITH MINERALS) tablet Take 1 tablet by mouth daily.     nebivolol (BYSTOLIC) 5 MG tablet Take 5 mg by mouth at bedtime.     Omega-3 Fatty Acids (FISH OIL) 1000 MG CAPS Take 1,000 mg by mouth daily.     ondansetron (ZOFRAN) 4 MG tablet Take 1 tablet (4 mg total) by  mouth every 6 (six) hours. (Patient taking differently: Take 4 mg by mouth every 6 (six) hours as needed for nausea or vomiting.) 12 tablet 0   potassium chloride (KLOR-CON) 10 MEQ tablet Take 10 mEq by mouth daily.     RABEprazole (ACIPHEX) 20 MG tablet TAKE 1 TABLET (20 MG TOTAL) BY MOUTH IN THE MORNING AND AT BEDTIME. 60 tablet 5   rOPINIRole (REQUIP) 0.5 MG tablet Take 1.5 mg by mouth at bedtime.     simvastatin (ZOCOR) 20 MG tablet Take 20 mg by mouth every Monday, Wednesday, and Friday.     solifenacin (VESICARE) 5 MG tablet Take 5 mg by mouth daily.  traZODone (DESYREL) 100 MG tablet Take 100 mg by mouth at bedtime.     TURMERIC CURCUMIN PO Take 1,000 mg by mouth daily.     vortioxetine HBr (TRINTELLIX) 10 MG TABS tablet Take 10 mg by mouth daily.     No current facility-administered medications for this visit.    Allergies as of 06/02/2022 - Review Complete 06/02/2022  Allergen Reaction Noted   Codeine Other (See Comments) 09/26/2012   Dicyclomine  01/05/2013   Ibuprofen Other (See Comments) 09/26/2012   Sulfa antibiotics  11/24/2016   Valium [diazepam] Other (See Comments) 09/26/2012    Family History  Problem Relation Age of Onset   Lung cancer Father    Diabetes Mellitus II Father    Crohn's disease Cousin    Hypertension Mother    Cystic fibrosis Mother    Lung cancer Brother    Hypertension Brother    Lupus Niece    Colon cancer Neg Hx    Celiac disease Neg Hx    Gastric cancer Neg Hx    Esophageal cancer Neg Hx     Social History   Socioeconomic History   Marital status: Single    Spouse name: Not on file   Number of children: Not on file   Years of education: Not on file   Highest education level: Not on file  Occupational History   Not on file  Tobacco Use   Smoking status: Never   Smokeless tobacco: Never  Vaping Use   Vaping Use: Never used  Substance and Sexual Activity   Alcohol use: No   Drug use: No   Sexual activity: Yes  Other Topics  Concern   Not on file  Social History Narrative   1 living   2 deceased   Social Determinants of Health   Financial Resource Strain: Not on file  Food Insecurity: Not on file  Transportation Needs: Not on file  Physical Activity: Not on file  Stress: Not on file  Social Connections: Not on file  Intimate Partner Violence: Not on file     Review of Systems   Gen: see HPI CV: Denies chest pain, heart palpitations, peripheral edema, syncope.  Resp: Denies shortness of breath at rest or with exertion. Denies wheezing or cough.  GI: Denies dysphagia or odynophagia. Denies jaundice, hematemesis, fecal incontinence. GU : Denies urinary burning, urinary frequency, urinary hesitancy MS: Denies joint pain, muscle weakness, cramps, or limitation of movement.  Derm: Denies rash, itching, dry skin Psych: Denies depression, anxiety, memory loss, and confusion Heme: Denies bruising, bleeding, and enlarged lymph nodes.   Physical Exam   BP 95/66 (BP Location: Left Arm, Patient Position: Sitting, Cuff Size: Large)   Pulse 89   Temp (!) 97.5 F (36.4 C) (Temporal)   Ht '5\' 4"'$  (1.626 m)   Wt 173 lb 3.2 oz (78.6 kg)   BMI 29.73 kg/m  General:   Alert and oriented. Pleasant and cooperative. Well-nourished and well-developed.  Head:  Normocephalic and atraumatic. Eyes:  Without icterus Abdomen:  +BS, soft, non-tender and non-distended. No HSM noted. No guarding or rebound. No masses appreciated.  Rectal:  Deferred  Msk:  Symmetrical without gross deformities. Normal posture. Extremities:  Without edema. Neurologic:  Alert and  oriented x4;  grossly normal neurologically. Skin:  Intact without significant lesions or rashes. Psych:  Alert and cooperative. Normal mood and affect.   Assessment   Chloe Golden is a 76 y.o. female presenting today with a history of  IBS, GERD, dysphagia, for routine follow-up.   IBS: remote constipation and more recently diarrhea predominant.  Underwent  colonoscopy 10/20/2021 with multiple tubular adenomas removed, random colon biopsies negative for microscopic colitis.  Recommended 3-year recall. Duodenal biopsies in the past negative for celiac disease. Now with constipation. Previously has taken linzess 72 mcg but too strong. Start Benefiber 2 teaspoons daily. Add Amitiza 8 mcg po BID with food if Benefiber is not helpful.   GERD: improved with Aciphex BID.   Weight loss: in setting of dietary changes. No abdominal pain. EGD and colonoscopy up-to-date. Suspect this is related to her eating choices. Will follow closely.     PLAN    Aciphex BID Start Benefiber. Add Amitiza 8 mcg po BID with food if no improvement 3 month follow-up   Annitta Needs, PhD, ANP-BC [facility]

## 2022-06-08 DIAGNOSIS — M0579 Rheumatoid arthritis with rheumatoid factor of multiple sites without organ or systems involvement: Secondary | ICD-10-CM | POA: Diagnosis not present

## 2022-06-09 DIAGNOSIS — G4733 Obstructive sleep apnea (adult) (pediatric): Secondary | ICD-10-CM | POA: Diagnosis not present

## 2022-06-09 DIAGNOSIS — R0981 Nasal congestion: Secondary | ICD-10-CM | POA: Diagnosis not present

## 2022-06-11 DIAGNOSIS — G4700 Insomnia, unspecified: Secondary | ICD-10-CM | POA: Diagnosis not present

## 2022-06-11 DIAGNOSIS — R61 Generalized hyperhidrosis: Secondary | ICD-10-CM | POA: Diagnosis not present

## 2022-06-11 DIAGNOSIS — W19XXXA Unspecified fall, initial encounter: Secondary | ICD-10-CM | POA: Diagnosis not present

## 2022-06-11 DIAGNOSIS — R296 Repeated falls: Secondary | ICD-10-CM | POA: Diagnosis not present

## 2022-06-11 DIAGNOSIS — S0990XD Unspecified injury of head, subsequent encounter: Secondary | ICD-10-CM | POA: Diagnosis not present

## 2022-06-11 DIAGNOSIS — E669 Obesity, unspecified: Secondary | ICD-10-CM | POA: Insufficient documentation

## 2022-06-11 DIAGNOSIS — S090XXA Injury of blood vessels of head, not elsewhere classified, initial encounter: Secondary | ICD-10-CM | POA: Diagnosis not present

## 2022-06-12 DIAGNOSIS — R3914 Feeling of incomplete bladder emptying: Secondary | ICD-10-CM | POA: Diagnosis not present

## 2022-06-17 ENCOUNTER — Encounter (HOSPITAL_BASED_OUTPATIENT_CLINIC_OR_DEPARTMENT_OTHER): Payer: Self-pay | Admitting: Orthopedic Surgery

## 2022-06-17 ENCOUNTER — Other Ambulatory Visit: Payer: Self-pay

## 2022-06-17 ENCOUNTER — Telehealth (INDEPENDENT_AMBULATORY_CARE_PROVIDER_SITE_OTHER): Payer: Self-pay | Admitting: *Deleted

## 2022-06-17 NOTE — Telephone Encounter (Signed)
Patient left voicemail that new med she was put on is not helping. Her stomach is on fire. I called patient back to get more information. She was seen 06/02/22 and note states gerd improved with aciphex bid. I asked her if she was taking one bid and she is and I let her know the new med was for constipation. She reports the aciphex must of stopped working then if she was already on it because she has taken a whole bottle of tums in past 10 days. Reports she has tried pantoprazole and dexilant in the past that worked for [employer] then stopped working.   [place].

## 2022-06-17 NOTE — Progress Notes (Signed)
   06/17/22 1404  PAT Phone Screen  Is the patient taking a GLP-1 receptor agonist? No  Do You Have Diabetes? Yes  Do You Have Hypertension? Yes  Have You Ever Been to the ER for Asthma? No  Have You Taken Oral Steroids in the Past 3 Months? No  Do you Take Phenteramine or any Other Diet Drugs? No  Recent  Lab Work, EKG, CXR? Yes  Where was this test performed? 12-27-21 EKG  Do you have a history of heart problems? No  Any Recent Hospitalizations? No  Height '5\' 4"'$  (1.626 m)  Weight 77.1 kg  Pat Appointment Scheduled No  Reason for No Appointment (S)  Pt. Lives Out of [place] (needs BMP DOS)

## 2022-06-18 NOTE — Telephone Encounter (Signed)
For now, let's have her add Pepcid OTC once to twice a day to calm things down. She had been doing well on Aciphex, so I suspect that with the holidays, she has had a flare of GERD.

## 2022-06-18 NOTE — Telephone Encounter (Signed)
Called and discussed with patient per Vicente Males -  For now, let's have her add Pepcid OTC once to twice a day to calm things down. She had been doing well on Aciphex, so I suspect that with the holidays, she has had a flare of GERD.   Patient verbalized understanding.

## 2022-06-25 ENCOUNTER — Other Ambulatory Visit: Payer: Self-pay | Admitting: Orthopedic Surgery

## 2022-06-25 DIAGNOSIS — E119 Type 2 diabetes mellitus without complications: Secondary | ICD-10-CM

## 2022-06-26 DIAGNOSIS — R8271 Bacteriuria: Secondary | ICD-10-CM | POA: Diagnosis not present

## 2022-06-29 NOTE — Anesthesia Preprocedure Evaluation (Signed)
Anesthesia Evaluation  Patient identified by MRN, date of birth, ID band Patient awake    Reviewed: Allergy & Precautions, NPO status , Patient's Chart, lab work & pertinent test results  History of Anesthesia Complications Negative for: history of anesthetic complications  Airway Mallampati: I  TM Distance: <3 FB Neck ROM: Full    Dental  (+) Teeth Intact, Dental Advisory Given   Pulmonary neg shortness of breath, sleep apnea and Continuous Positive Airway Pressure Ventilation , neg COPD, neg recent URI   Pulmonary exam normal breath sounds clear to auscultation       Cardiovascular hypertension (amlodipine, furosemide, losartan-HCTZ, nebivolol), Pt. on medications and Pt. on home beta blockers (-) angina + Peripheral Vascular Disease  (-) Past MI, (-) Cardiac Stents and (-) CABG  Rhythm:Regular Rate:Normal  HLD   Neuro/Psych neg Seizures  Neuromuscular disease (neuropathy, lumbago)    GI/Hepatic Neg liver ROS, hiatal hernia,GERD  ,,  Endo/Other  diabetes, Type 2, Oral Hypoglycemic Agents    Renal/GU negative Renal ROS Bladder dysfunction Female GU complaint     Musculoskeletal  (+) Arthritis , Osteoarthritis and Rheumatoid disorders,  Fibromyalgia -  Abdominal   Peds  Hematology negative hematology ROS (+)   Anesthesia Other Findings   Reproductive/Obstetrics                             Anesthesia Physical Anesthesia Plan  ASA: 2  Anesthesia Plan: MAC and Regional   Post-op Pain Management: Regional block* and Tylenol PO (pre-op)*   Induction: Intravenous  PONV Risk Score and Plan: 2 and Ondansetron, Dexamethasone, Propofol infusion and Treatment may vary due to age or medical condition  Airway Management Planned: Natural Airway and Simple Face Mask  Additional Equipment:   Intra-op Plan:   Post-operative Plan:   Informed Consent: I have reviewed the patients History and  Physical, chart, labs and discussed the procedure including the risks, benefits and alternatives for the proposed anesthesia with the patient or authorized representative who has indicated his/her understanding and acceptance.     Dental advisory given  Plan Discussed with: CRNA and Anesthesiologist  Anesthesia Plan Comments: (Discussed potential risks of nerve blocks including, but not limited to, infection, bleeding, nerve damage, seizures, pneumothorax, respiratory depression, and potential failure of the block. Alternatives to nerve blocks discussed. All questions answered.  Discussed with patient risks of MAC including, but not limited to, minor pain or discomfort, hearing people in the room, and possible need for backup general anesthesia. Risks for general anesthesia also discussed including, but not limited to, sore throat, hoarse voice, chipped/damaged teeth, injury to vocal cords, nausea and vomiting, allergic reactions, lung infection, heart attack, stroke, and death. All questions answered. )        Anesthesia Quick Evaluation

## 2022-06-30 ENCOUNTER — Ambulatory Visit (HOSPITAL_BASED_OUTPATIENT_CLINIC_OR_DEPARTMENT_OTHER): Payer: Medicare Other | Admitting: Anesthesiology

## 2022-06-30 ENCOUNTER — Ambulatory Visit (HOSPITAL_BASED_OUTPATIENT_CLINIC_OR_DEPARTMENT_OTHER): Payer: Medicare Other

## 2022-06-30 ENCOUNTER — Encounter (HOSPITAL_BASED_OUTPATIENT_CLINIC_OR_DEPARTMENT_OTHER)
Admission: RE | Disposition: A | Discharge status: Home / Self Care | Payer: Self-pay | Source: Home / Self Care | Attending: Orthopedic Surgery

## 2022-06-30 ENCOUNTER — Other Ambulatory Visit: Payer: Self-pay

## 2022-06-30 ENCOUNTER — Encounter (HOSPITAL_BASED_OUTPATIENT_CLINIC_OR_DEPARTMENT_OTHER): Payer: Self-pay | Admitting: Orthopedic Surgery

## 2022-06-30 ENCOUNTER — Ambulatory Visit (HOSPITAL_BASED_OUTPATIENT_CLINIC_OR_DEPARTMENT_OTHER)
Admission: RE | Admit: 2022-06-30 | Discharge: 2022-06-30 | Disposition: A | Discharge status: Home / Self Care | Payer: Medicare Other | Attending: Orthopedic Surgery | Admitting: Orthopedic Surgery

## 2022-06-30 DIAGNOSIS — K219 Gastro-esophageal reflux disease without esophagitis: Secondary | ICD-10-CM | POA: Diagnosis not present

## 2022-06-30 DIAGNOSIS — M069 Rheumatoid arthritis, unspecified: Secondary | ICD-10-CM | POA: Diagnosis not present

## 2022-06-30 DIAGNOSIS — G4733 Obstructive sleep apnea (adult) (pediatric): Secondary | ICD-10-CM | POA: Diagnosis not present

## 2022-06-30 DIAGNOSIS — I10 Essential (primary) hypertension: Secondary | ICD-10-CM | POA: Diagnosis not present

## 2022-06-30 DIAGNOSIS — E785 Hyperlipidemia, unspecified: Secondary | ICD-10-CM | POA: Insufficient documentation

## 2022-06-30 DIAGNOSIS — M1811 Unilateral primary osteoarthritis of first carpometacarpal joint, right hand: Secondary | ICD-10-CM

## 2022-06-30 DIAGNOSIS — M797 Fibromyalgia: Secondary | ICD-10-CM | POA: Insufficient documentation

## 2022-06-30 DIAGNOSIS — Z8249 Family history of ischemic heart disease and other diseases of the circulatory system: Secondary | ICD-10-CM | POA: Insufficient documentation

## 2022-06-30 DIAGNOSIS — E119 Type 2 diabetes mellitus without complications: Secondary | ICD-10-CM

## 2022-06-30 DIAGNOSIS — Z79899 Other long term (current) drug therapy: Secondary | ICD-10-CM | POA: Insufficient documentation

## 2022-06-30 DIAGNOSIS — Z833 Family history of diabetes mellitus: Secondary | ICD-10-CM | POA: Insufficient documentation

## 2022-06-30 DIAGNOSIS — Z7984 Long term (current) use of oral hypoglycemic drugs: Secondary | ICD-10-CM | POA: Diagnosis not present

## 2022-06-30 DIAGNOSIS — K449 Diaphragmatic hernia without obstruction or gangrene: Secondary | ICD-10-CM | POA: Diagnosis not present

## 2022-06-30 DIAGNOSIS — E1151 Type 2 diabetes mellitus with diabetic peripheral angiopathy without gangrene: Secondary | ICD-10-CM | POA: Insufficient documentation

## 2022-06-30 DIAGNOSIS — N3281 Overactive bladder: Secondary | ICD-10-CM | POA: Diagnosis not present

## 2022-06-30 HISTORY — DX: Type 2 diabetes mellitus without complications: E11.9

## 2022-06-30 HISTORY — DX: Overactive bladder: N32.81

## 2022-06-30 HISTORY — PX: CARPOMETACARPEL SUSPENSION PLASTY: SHX5005

## 2022-06-30 LAB — BASIC METABOLIC PANEL
Anion gap: 11 (ref 5–15)
BUN: 13 mg/dL (ref 8–23)
CO2: 27 mmol/L (ref 22–32)
Calcium: 9.2 mg/dL (ref 8.9–10.3)
Chloride: 101 mmol/L (ref 98–111)
Creatinine, Ser: 0.85 mg/dL (ref 0.44–1.00)
GFR, Estimated: >60 mL/min (ref >60)
Glucose, Bld: 88 mg/dL (ref 70–99)
Potassium: 3.2 mmol/L — ABNORMAL LOW (ref 3.5–5.1)
Sodium: 139 mmol/L (ref 135–145)

## 2022-06-30 LAB — GLUCOSE, CAPILLARY
Glucose-Capillary: 95 mg/dL (ref 70–99)
Glucose-Capillary: 95 mg/dL (ref 70–99)

## 2022-06-30 SURGERY — CARPOMETACARPEL (CMC) SUSPENSION PLASTY
Anesthesia: Monitor Anesthesia Care | Site: Thumb | Laterality: Right

## 2022-06-30 MED ORDER — BUPIVACAINE HCL (PF) 0.5 % IJ SOLN
INTRAMUSCULAR | Status: DC | PRN
  Administered 2022-06-30: 20 mL via PERINEURAL

## 2022-06-30 MED ORDER — CEFAZOLIN SODIUM-DEXTROSE 2-4 GM/100ML-% IV SOLN: 2.0000 g | INTRAVENOUS | Status: DC

## 2022-06-30 MED ORDER — HYDROCODONE-ACETAMINOPHEN 5-325 MG PO TABS: ORAL_TABLET | ORAL | 0 refills | Status: DC

## 2022-06-30 MED ORDER — AMISULPRIDE (ANTIEMETIC) 5 MG/2ML IV SOLN: 10.0000 mg | Freq: Once | INTRAVENOUS | Status: DC | PRN

## 2022-06-30 MED ORDER — FENTANYL CITRATE (PF) 100 MCG/2ML IJ SOLN
50.0000 ug | Freq: Once | INTRAMUSCULAR | Status: AC
  Administered 2022-06-30: 50 ug via INTRAVENOUS

## 2022-06-30 MED ORDER — PROPOFOL 500 MG/50ML IV EMUL
INTRAVENOUS | Status: DC | PRN
  Administered 2022-06-30: 125 ug/kg/min via INTRAVENOUS

## 2022-06-30 MED ORDER — OXYCODONE HCL 5 MG/5ML PO SOLN: 5.0000 mg | Freq: Once | ORAL | Status: DC | PRN

## 2022-06-30 MED ORDER — 0.9 % SODIUM CHLORIDE (POUR BTL) OPTIME
TOPICAL | Status: DC | PRN
  Administered 2022-06-30: 200 mL

## 2022-06-30 MED ORDER — LIDOCAINE-EPINEPHRINE (PF) 1.5 %-1:200000 IJ SOLN
INTRAMUSCULAR | Status: DC | PRN
  Administered 2022-06-30: 10 mL via PERINEURAL

## 2022-06-30 MED ORDER — LACTATED RINGERS IV SOLN: INTRAVENOUS | Status: DC

## 2022-06-30 MED ORDER — ACETAMINOPHEN 500 MG PO TABS
1000.0000 mg | ORAL_TABLET | Freq: Once | ORAL | Status: AC
  Administered 2022-06-30: 1000 mg via ORAL

## 2022-06-30 MED ORDER — FENTANYL CITRATE (PF) 100 MCG/2ML IJ SOLN: 25.0000 ug | INTRAMUSCULAR | Status: DC | PRN

## 2022-06-30 MED ORDER — ONDANSETRON HCL 4 MG/2ML IJ SOLN
INTRAMUSCULAR | Status: AC
  Filled 2022-06-30: qty 2

## 2022-06-30 MED ORDER — CEFAZOLIN SODIUM-DEXTROSE 2-4 GM/100ML-% IV SOLN
2.0000 g | INTRAVENOUS | Status: AC
  Administered 2022-06-30: 2 g via INTRAVENOUS

## 2022-06-30 MED ORDER — OXYCODONE HCL 5 MG PO TABS: 5.0000 mg | ORAL_TABLET | Freq: Once | ORAL | Status: DC | PRN

## 2022-06-30 MED ORDER — PROPOFOL 10 MG/ML IV BOLUS
INTRAVENOUS | Status: AC
  Filled 2022-06-30: qty 20

## 2022-06-30 MED ORDER — FENTANYL CITRATE (PF) 100 MCG/2ML IJ SOLN
INTRAMUSCULAR | Status: AC
  Filled 2022-06-30: qty 2

## 2022-06-30 MED ORDER — ACETAMINOPHEN 500 MG PO TABS
ORAL_TABLET | ORAL | Status: AC
  Filled 2022-06-30: qty 2

## 2022-06-30 MED ORDER — ONDANSETRON HCL 4 MG/2ML IJ SOLN
INTRAMUSCULAR | Status: DC | PRN
  Administered 2022-06-30: 4 mg via INTRAVENOUS

## 2022-06-30 SURGICAL SUPPLY — 82 items
APL PRP STRL LF DISP 70% ISPRP (MISCELLANEOUS) ×2
BAG DECANTER FOR FLEXI CONT (MISCELLANEOUS) IMPLANT
BALL CTTN LRG ABS STRL LF (GAUZE/BANDAGES/DRESSINGS)
BIT DRILL PASSING CMC 1/4 FLEX (BIT) ×1 IMPLANT
BLADE MINI RND TIP GREEN BEAV (BLADE) ×2 IMPLANT
BLADE SURG 15 STRL LF DISP TIS (BLADE) ×4 IMPLANT
BLADE SURG 15 STRL SS (BLADE) ×4
BNDG CMPR 9X4 STRL LF SNTH (GAUZE/BANDAGES/DRESSINGS) ×2
BNDG ELASTIC 2X5.8 VLCR STR LF (GAUZE/BANDAGES/DRESSINGS) IMPLANT
BNDG ELASTIC 3X5.8 VLCR STR LF (GAUZE/BANDAGES/DRESSINGS) ×2 IMPLANT
BNDG ESMARK 4X9 LF (GAUZE/BANDAGES/DRESSINGS) ×2 IMPLANT
BNDG GAUZE DERMACEA FLUFF 4 (GAUZE/BANDAGES/DRESSINGS) ×2 IMPLANT
BNDG GZE DERMACEA 4 6PLY (GAUZE/BANDAGES/DRESSINGS) ×2
BUTTON ALL-SUT W/BACKSTOP (Orthopedic Implant) ×1 IMPLANT
CHLORAPREP W/TINT 26 (MISCELLANEOUS) ×2 IMPLANT
CORD BIPOLAR FORCEPS 12FT (ELECTRODE) ×2 IMPLANT
COTTONBALL LRG STERILE PKG (GAUZE/BANDAGES/DRESSINGS) IMPLANT
COVER BACK TABLE 60X90IN (DRAPES) ×2 IMPLANT
COVER MAYO STAND STRL (DRAPES) ×2 IMPLANT
CUFF TOURN SGL QUICK 18X4 (TOURNIQUET CUFF) ×2 IMPLANT
DRAPE EXTREMITY T 121X128X90 (DISPOSABLE) ×2 IMPLANT
DRAPE OEC MINIVIEW 54X84 (DRAPES) ×2 IMPLANT
DRAPE SURG 17X23 STRL (DRAPES) ×2 IMPLANT
DRILL PASSING CMC 1/4 FLEX (BIT) ×2
GAUZE 4X4 16PLY ~~LOC~~+RFID DBL (SPONGE) IMPLANT
GAUZE PAD ABD 8X10 STRL (GAUZE/BANDAGES/DRESSINGS) IMPLANT
GAUZE SPONGE 4X4 12PLY STRL (GAUZE/BANDAGES/DRESSINGS) ×2 IMPLANT
GAUZE XEROFORM 1X8 LF (GAUZE/BANDAGES/DRESSINGS) ×2 IMPLANT
GLOVE BIO SURGEON STRL SZ7.5 (GLOVE) ×2 IMPLANT
GLOVE BIOGEL PI IND STRL 8 (GLOVE) ×2 IMPLANT
GLOVE BIOGEL PI IND STRL 8.5 (GLOVE) IMPLANT
GLOVE SURG ORTHO 8.0 STRL STRW (GLOVE) IMPLANT
GOWN STRL REUS W/ TWL LRG LVL3 (GOWN DISPOSABLE) ×2 IMPLANT
GOWN STRL REUS W/TWL LRG LVL3 (GOWN DISPOSABLE) ×2
GOWN STRL REUS W/TWL XL LVL3 (GOWN DISPOSABLE) ×2 IMPLANT
LOOP VESSEL MAXI BLUE (MISCELLANEOUS) IMPLANT
NDL HYPO 25X1 1.5 SAFETY (NEEDLE) IMPLANT
NDL KEITH (NEEDLE) IMPLANT
NDL SAFETY ECLIP 18X1.5 (MISCELLANEOUS) IMPLANT
NEEDLE HYPO 25X1 1.5 SAFETY (NEEDLE) IMPLANT
NEEDLE KEITH (NEEDLE) IMPLANT
NS IRRIG 1000ML POUR BTL (IV SOLUTION) ×2 IMPLANT
PACK BASIN DAY SURGERY FS (CUSTOM PROCEDURE TRAY) ×2 IMPLANT
PAD CAST 3X4 CTTN HI CHSV (CAST SUPPLIES) ×2 IMPLANT
PAD CAST 4YDX4 CTTN HI CHSV (CAST SUPPLIES) IMPLANT
PADDING CAST ABS COTTON 3X4 (CAST SUPPLIES) IMPLANT
PADDING CAST ABS COTTON 4X4 ST (CAST SUPPLIES) ×2 IMPLANT
PADDING CAST COTTON 3X4 STRL (CAST SUPPLIES) ×2
PADDING CAST COTTON 4X4 STRL (CAST SUPPLIES)
SLEEVE SCD COMPRESS KNEE MED (STOCKING) IMPLANT
SPIKE FLUID TRANSFER (MISCELLANEOUS) IMPLANT
SPLINT PLASTER CAST FAST 5X30 (CAST SUPPLIES) IMPLANT
SPLINT PLASTER CAST XFAST 3X15 (CAST SUPPLIES) IMPLANT
SPLINT PLASTER CAST XFAST 4X15 (CAST SUPPLIES) IMPLANT
STOCKINETTE 4X48 STRL (DRAPES) ×2 IMPLANT
SUT CHROMIC 5 0 P 3 (SUTURE) IMPLANT
SUT ETHIBOND 3-0 V-5 (SUTURE) IMPLANT
SUT ETHILON 3 0 PS 1 (SUTURE) IMPLANT
SUT ETHILON 4 0 PS 2 18 (SUTURE) ×2 IMPLANT
SUT FIBERWIRE 2-0 18 17.9 3/8 (SUTURE) ×2
SUT FIBERWIRE 3-0 18 TAPR NDL (SUTURE)
SUT FIBERWIRE 4-0 18 DIAM BLUE (SUTURE)
SUT MERSILENE 2.0 SH NDLE (SUTURE) IMPLANT
SUT MERSILENE 4 0 P 3 (SUTURE) IMPLANT
SUT PROLENE 2 0 SH DA (SUTURE) IMPLANT
SUT PROLENE 6 0 P 1 18 (SUTURE) IMPLANT
SUT SILK 2 0 PERMA HAND 18 BK (SUTURE) IMPLANT
SUT SILK 4 0 PS 2 (SUTURE) IMPLANT
SUT VIC AB 3-0 PS1 18 (SUTURE)
SUT VIC AB 3-0 PS1 18XBRD (SUTURE) IMPLANT
SUT VIC AB 4-0 P-3 18XBRD (SUTURE) IMPLANT
SUT VIC AB 4-0 P3 18 (SUTURE)
SUT VICRYL 0 SH 27 (SUTURE) IMPLANT
SUT VICRYL 4-0 PS2 18IN ABS (SUTURE) ×2 IMPLANT
SUTURE FIBERWR 2-0 18 17.9 3/8 (SUTURE) ×2 IMPLANT
SUTURE FIBERWR 3-0 18 TAPR NDL (SUTURE) IMPLANT
SUTURE FIBERWR 4-0 18 DIA BLUE (SUTURE) IMPLANT
SYR BULB EAR ULCER 3OZ GRN STR (SYRINGE) ×2 IMPLANT
SYR CONTROL 10ML LL (SYRINGE) IMPLANT
TOWEL GREEN STERILE FF (TOWEL DISPOSABLE) ×4 IMPLANT
TUBE NG 5FR 35IN ENFIT (TUBING) IMPLANT
UNDERPAD 30X36 HEAVY ABSORB (UNDERPADS AND DIAPERS) ×2 IMPLANT

## 2022-06-30 NOTE — Anesthesia Procedure Notes (Signed)
Anesthesia Procedure Image    

## 2022-06-30 NOTE — Progress Notes (Signed)
Assisted Dr. Rose with right, axillary, ultrasound guided block. Side rails up, monitors on throughout procedure. See vital signs in flow sheet. Tolerated Procedure well. 

## 2022-06-30 NOTE — H&P (Signed)
Chloe Golden is an 77 y.o. female.   Chief Complaint: cmc arthritis HPI: 77 yo female with right thumb cmc arthritis.  Has tried splints, antiinflammatories and injections without lasting relief.  She wishes to have trapeziectomy and suspensionplasty.  Allergies:  Allergies  Allergen Reactions   Codeine Other (See Comments)    Constipation   Dicyclomine     TURNED HER SKIN BLUE   Ibuprofen Other (See Comments)    Ulcers    Sulfa Antibiotics     Yeast infections    Valium [Diazepam] Other (See Comments)    Alters mental status    Past Medical History:  Diagnosis Date   Acid reflux    Arthritis    Degenerative arthritis of cervical spine    Diabetes mellitus without complication (HCC)    Fibromyalgia    Functional gastrointestinal disorder    GERD (gastroesophageal reflux disease)    Glaucoma    Hiatal hernia    Hypertension    Neuropathy    OAB (overactive bladder)    OSA on CPAP    stopped 40mo ago   PAD (peripheral artery disease) (HCC)    Mild by arterial Dopplers April 2019   Rheumatoid arthritis(714.0)    Venous reflux     Past Surgical History:  Procedure Laterality Date   ABDOMINAL HYSTERECTOMY     BACTERIAL OVERGROWTH TEST N/A 11/09/2012   Procedure: BACTERIAL OVERGROWTH TEST;  Surgeon: SDanie Binder MD;  [facility];  Service: Endoscopy;  Laterality: N/A;  7:30   BALLOON DILATION N/A 07/07/2021   Procedure: BALLOON DILATION;  Surgeon: CEloise Harman DO;  [facility];  Service: Endoscopy;  Laterality: N/A;   BIOPSY N/A 10/28/2012   Procedure: BIOPSY;  Surgeon: SDanie Binder MD;  [facility];  Service: Endoscopy;  Laterality: N/A;  DUODENAL BIOPSY   BIOPSY  07/07/2021   Procedure: BIOPSY;  Surgeon: CEloise Harman DO;  [facility];  Service: Endoscopy;;   BIOPSY  10/20/2021   Procedure: BIOPSY;  Surgeon: CEloise Harman DO;  [facility];  Service: Endoscopy;;   BLADDER SURGERY      CARPAL TUNNEL RELEASE Bilateral    CHOLECYSTECTOMY     COLONOSCOPY WITH ESOPHAGOGASTRODUODENOSCOPY (EGD) N/A 10/28/2012   SBOF:BPZWCHENdiverticulosis in the descending colon and sigmoid colon/Small internal hemorrhoids   COLONOSCOPY WITH PROPOFOL Left 10/20/2021   Procedure: COLONOSCOPY WITH PROPOFOL;  Surgeon: CEloise Harman DO;  [facility];  Service: Endoscopy;  Laterality: Left;  7:30am   ESOPHAGOGASTRODUODENOSCOPY N/A 11/27/2016   Procedure: ESOPHAGOGASTRODUODENOSCOPY (EGD);  Surgeon: FDanie Binder MD;  [facility];  Service: Endoscopy;  Laterality: N/A;  830    ESOPHAGOGASTRODUODENOSCOPY (EGD) WITH ESOPHAGEAL DILATION N/A 10/28/2012   SIDP:OEUMPNTICERVIAL WEB, S/P DILATION/MILD Non-erosive gastritis & DUODENITIS   ESOPHAGOGASTRODUODENOSCOPY (EGD) WITH PROPOFOL N/A 07/07/2021   gastritis s/p biopsy, normal duodenum. dilation.  Negative H.pylori.   POLYPECTOMY  10/20/2021   Procedure: POLYPECTOMY;  Surgeon: CEloise Harman DO;  [facility];  Service: Endoscopy;;   SAVORY DILATION N/A 11/27/2016   Procedure: SAVORY DILATION;  Surgeon: FDanie Binder MD;  [facility];  Service: Endoscopy;  Laterality: N/A;   TOTAL KNEE ARTHROPLASTY Right     Family History: Family History  Problem Relation Age of Onset   Lung cancer Father    Diabetes Mellitus II Father    Crohn's disease Cousin    Hypertension  Mother    Cystic fibrosis Mother    Lung cancer Brother    Hypertension Brother    Lupus Niece    Colon cancer Neg Hx    Celiac disease Neg Hx    Gastric cancer Neg Hx    Esophageal cancer Neg Hx     Social History:   reports that she has never smoked. She has never used smokeless tobacco. She reports that she does not drink alcohol and does not use drugs.  Medications: Medications Prior to Admission  Medication Sig Dispense Refill   ALPRAZolam (XANAX) 0.5 MG tablet Take 0.5-1 mg by mouth See admin instructions. 0.5 mg  in the morning, 1 mg at bedtime     amLODipine (NORVASC) 5 MG tablet Take 5 mg by mouth daily.     baclofen (LIORESAL) 10 MG tablet Take 10 mg by mouth 2 (two) times daily.     Coenzyme Q10 (COQ-10) 100 MG CAPS Take 100 mg by mouth daily at 6 (six) AM.     conjugated estrogens (PREMARIN) vaginal cream Place 1 applicator vaginally See admin instructions. Twice a week as directed     donepezil (ARICEPT) 10 MG tablet Take 10 mg by mouth daily.     ezetimibe (ZETIA) 10 MG tablet Take 10 mg by mouth at bedtime.      furosemide (LASIX) 20 MG tablet Take 20 mg by mouth daily.     gabapentin (NEURONTIN) 100 MG capsule Take 100 mg by mouth 2 (two) times daily.     GEMTESA 75 MG TABS Take 75 mg by mouth daily.     hydroxychloroquine (PLAQUENIL) 200 MG tablet Take 400 mg by mouth daily.     JANUVIA 100 MG tablet Take 100 mg by mouth daily.     JARDIANCE 10 MG TABS tablet Take 10 mg by mouth daily.     leflunomide (ARAVA) 10 MG tablet Take 10 mg by mouth daily.     losartan-hydrochlorothiazide (HYZAAR) 100-12.5 MG tablet Take 1 tablet by mouth daily.     lubiprostone (AMITIZA) 8 MCG capsule Take 1 capsule (8 mcg total) by mouth 2 (two) times daily with a meal. 60 capsule 3   meclizine (ANTIVERT) 25 MG tablet Take 25 mg by mouth 3 (three) times daily as needed for dizziness.     Multiple Vitamins-Minerals (MULTIVITAMIN WITH MINERALS) tablet Take 1 tablet by mouth daily.     nebivolol (BYSTOLIC) 5 MG tablet Take 5 mg by mouth at bedtime.     Omega-3 Fatty Acids (FISH OIL) 1000 MG CAPS Take 1,000 mg by mouth daily.     ondansetron (ZOFRAN) 4 MG tablet Take 1 tablet (4 mg total) by mouth every 6 (six) hours. (Patient taking differently: Take 4 mg by mouth every 6 (six) hours as needed for nausea or vomiting.) 12 tablet 0   potassium chloride (KLOR-CON) 10 MEQ tablet Take 10 mEq by mouth daily.     RABEprazole (ACIPHEX) 20 MG tablet TAKE 1 TABLET (20 MG TOTAL) BY MOUTH IN THE MORNING AND AT BEDTIME. 60 tablet 5    rOPINIRole (REQUIP) 0.5 MG tablet Take 1.5 mg by mouth at bedtime.     simvastatin (ZOCOR) 20 MG tablet Take 20 mg by mouth every Monday, Wednesday, and Friday.     traZODone (DESYREL) 100 MG tablet Take 100 mg by mouth at bedtime.     TURMERIC CURCUMIN PO Take 1,000 mg by mouth daily.     ACCU-CHEK GUIDE test strip See admin instructions.  vortioxetine HBr (TRINTELLIX) 10 MG TABS tablet Take 10 mg by mouth daily.      Results for orders placed or performed during the hospital encounter of 06/30/22 (from the past 48 hour(s))  Glucose, capillary     Status: None   Collection Time: 06/30/22 12:21 PM  Result Value Ref Range   Glucose-Capillary 95 70 - 99 mg/dL    Comment: Glucose reference range applies only to samples taken after fasting for at least 8 hours.    No results found.    Blood pressure 133/76, pulse 79, temperature 97.9 F (36.6 C), temperature source Oral, height '5\' 4"'$  (1.626 m), weight 76.2 kg, SpO2 98 %.  General appearance: alert, cooperative, and appears stated age Head: Normocephalic, without obvious abnormality, atraumatic Neck: supple, symmetrical, trachea midline Extremities: Intact sensation and capillary refill all digits.  +epl/fpl/io.  No wounds.  Pulses: 2+ and symmetric Skin: Skin color, texture, turgor normal. No rashes or lesions Neurologic: Grossly normal Incision/Wound: none  Assessment/Plan Right thumb cmc arthritis.  Plan trapeziectomy and suspensionplasty.  Non operative and operative treatment options have been discussed with the patient and patient wishes to proceed with operative treatment. Risks, benefits, and alternatives of surgery have been discussed and the patient agrees with the plan of care.   Leanora Cover 06/30/2022, 12:52 PM

## 2022-06-30 NOTE — Anesthesia Procedure Notes (Signed)
Anesthesia Regional Block: Axillary brachial plexus block   Pre-Anesthetic Checklist: , timeout performed,  Correct Patient, Correct Site, Correct Laterality,  Correct Procedure, Correct Position, site marked,  Risks and benefits discussed,  Surgical consent,  Pre-op evaluation,  At surgeon's request and post-op pain management  Laterality: Right  Prep: chloraprep       Needles:  Injection technique: Single-shot  Needle Type: Echogenic Needle     Needle Length: 9cm      Additional Needles:   Procedures:,,,, ultrasound used (permanent image in chart),,    Narrative:  Start time: 06/30/2022 1:24 PM End time: 06/30/2022 1:34 PM Injection made incrementally with aspirations every 5 mL.  Performed by: Personally  Anesthesiologist: Myrtie Soman, MD  Additional Notes: Patient tolerated the procedure well without complications

## 2022-06-30 NOTE — Discharge Instructions (Addendum)
[facility] Instructions Hand Surgery  Wound Care: Keep your hand elevated above the level of your heart.  Do not allow it to dangle by your side.  Keep the dressing dry and do not remove it unless your doctor advises you to do so.  He will usually change it at the time of your post-op visit.  Moving your fingers is advised to stimulate circulation but will depend on the site of your surgery.  If you have a splint applied, your doctor will advise you regarding movement.  Activity: Do not drive or operate machinery today.  Rest today and then you may return to your normal activity and work as indicated by your physician.  Diet:  Drink liquids today or eat a light diet.  You may resume a regular diet tomorrow.    General expectations: Pain for two to three days. Fingers may become slightly swollen.  Call your doctor if any of the following occur: Severe pain not relieved by pain medication. Elevated temperature. Dressing soaked with blood. Inability to move fingers. White or bluish color to fingers.    Post Anesthesia Home Care Instructions  Activity: Get plenty of rest for the remainder of the day. A responsible individual must stay with you for 24 hours following the procedure.  For the next 24 hours, DO NOT: -Drive a car -Paediatric nurse -Drink alcoholic beverages -Take any medication unless instructed by your physician -Make any legal decisions or sign important papers.  Meals: Start with liquid foods such as gelatin or soup. Progress to regular foods as tolerated. Avoid greasy, spicy, heavy foods. If nausea and/or vomiting occur, drink only clear liquids until the nausea and/or vomiting subsides. Call your physician if vomiting continues.  Special Instructions/Symptoms: Your throat may feel dry or sore from the anesthesia or the breathing tube placed in your throat during surgery. If this causes discomfort, gargle with warm salt water. The discomfort should disappear  within 24 hours.   Regional Anesthesia Blocks  1. Numbness or the inability to move the "blocked" extremity may last from 3-48 hours after placement. The length of time depends on the medication injected and your individual response to the medication. If the numbness is not going away after 48 hours, call your surgeon.  2. The extremity that is blocked will need to be protected until the numbness is gone and the  Strength has returned. Because you cannot feel it, you will need to take extra care to avoid injury. Because it may be weak, you may have difficulty moving it or using it. You may not know what position it is in without looking at it while the block is in effect.  3. For blocks in the legs and feet, returning to weight bearing and walking needs to be done carefully. You will need to wait until the numbness is entirely gone and the strength has returned. You should be able to move your leg and foot normally before you try and bear weight or walk. You will need someone to be with you when you first try to ensure you do not fall and possibly risk injury.  4. Bruising and tenderness at the needle site are common side effects and will resolve in a few days.  5. Persistent numbness or new problems with movement should be communicated to the surgeon or the [place] [phone] [place] [phone]).  May take Tylenol after 6:15pm if needed.

## 2022-06-30 NOTE — Anesthesia Postprocedure Evaluation (Signed)
Anesthesia Post Note  Patient: Chloe Golden  Procedure(s) Performed: RIGHT THUMB TRAPEZIECTOMY AND SUSPENSIONPLASTY (Right: Thumb)     Patient location during evaluation: PACU Anesthesia Type: Regional and MAC Level of consciousness: awake and alert Pain management: pain level controlled Vital Signs Assessment: post-procedure vital signs reviewed and stable Respiratory status: spontaneous breathing, nonlabored ventilation, respiratory function stable and patient connected to nasal cannula oxygen Cardiovascular status: stable and blood pressure returned to baseline Postop Assessment: no apparent nausea or vomiting Anesthetic complications: no  No notable events documented.  Last Vitals:  Vitals:   06/30/22 1600 06/30/22 1620  BP: (!) 149/67 (!) 144/75  Pulse: 75 83  Resp: 13 15  Temp:  36.4 C  SpO2: 94% 90%    Last Pain:  Vitals:   06/30/22 1620  TempSrc:   PainSc: 0-No pain                 [name]

## 2022-06-30 NOTE — Op Note (Signed)
NAME: Chloe Golden MEDICAL RECORD NO: [id number] DATE OF BIRTH: 07/08/1945 FACILITY: [facility] LOCATION: [facility] PHYSICIAN: Tennis Must, MD   OPERATIVE REPORT   DATE OF PROCEDURE: 06/30/22    PREOPERATIVE DIAGNOSIS: Right thumb CMC osteoarthritis   POSTOPERATIVE DIAGNOSIS: Right thumb CMC osteoarthritis   PROCEDURE: 1.  Right thumb trapeziectomy 2.  Right thumb suspensioplasty   SURGEON:  Leanora Cover, M.D.   ASSISTANT: Leverne Humbles, [facility]   ANESTHESIA:  Regional with sedation   INTRAVENOUS FLUIDS:  Per anesthesia flow sheet.   ESTIMATED BLOOD LOSS:  Minimal.   COMPLICATIONS:  None.   SPECIMENS:  none   TOURNIQUET TIME:    Total Tourniquet Time Documented: Upper Arm (Right) - 60 minutes Total: Upper Arm (Right) - 60 minutes    DISPOSITION:  Stable to PACU.   INDICATIONS: 77 year old female with right thumb CMC osteoarthritis.  She is tried splints anti-inflammatories and injections without lasting relief.  She wishes to have trapeziectomy and suspensioplasty.  Risks, benefits and alternatives of surgery were discussed including the risks of blood loss, infection, damage to nerves, vessels, tendons, ligaments, bone for surgery, need for additional surgery, complications with wound healing, continued pain, stiffness.  She voiced understanding of these risks and elected to proceed.  OPERATIVE COURSE:  After being identified preoperatively by myself,  the patient and I agreed on the procedure and site of the procedure.  The surgical site was marked.  Surgical consent had been signed. Preoperative IV antibiotic prophylaxis was given. She was transferred to the operating room and placed on the operating table in supine position with the Right upper extremity on an arm board.  Sedation was induced by the anesthesiologist. A regional block had been performed by anesthesia in preoperative holding.    Right upper extremity was prepped and draped in normal  sterile orthopedic fashion.  A surgical pause was performed between the surgeons, anesthesia, and operating room staff and all were in agreement as to the patient, procedure, and site of procedure.  Tourniquet at the proximal aspect of the extremity was inflated to 250 mmHg after exsanguination of the arm with an Esmarch bandage.  Incision was made over the [facility] joint and carried into subcutaneous tissues by spreading technique.  Bipolar electrocautery was used to obtain hemostasis.  A branch of the superficial branch of radial nerve was identified and protected throughout the case.  The interval between the APL and EPB was made.  The deep branch of radial artery was identified and freed up and protected throughout the case.  The capsule was sharply incised.  The trapezium was identified.  This was confirmed on C arm.  The trapezium was then carefully freed up of soft tissue attachments using the knife and freer elevator.  It was removed with the rongeurs in a piecemeal fashion.  The FCR tendon was protected.  It went through a tight groove in the trapezium.  The volar half of the FCR tendon was then harvested from proximally, creating a distally based tendon flap.  The microlink guidewire was then advanced from the thumb metacarpal across the index finger metacarpal.  C-arm was used in AP and lateral projections to ensure appropriate position of the guidepin which was the case.  Incision was made on the dorsum of the hand and the guidepin retrieved.  This was then used to pass the microlink anchor.  The anchor was placed on the dorsum of the hand and tied over the index finger metacarpal while  a hemostat was placed between the thumb and index finger metacarpals to prevent overtightening.  There was good suspension of the thumb metacarpal.  The FCR tendon transfer was then passed through the APL tendon and back onto itself.  It was sutured down to itself and then sutured to the APL tendon with 3-0 Ethibond suture.   C-arm was used in AP lateral oblique projections to ensure appropriate suspension of the thumb metacarpal which was the case.  The wound was irrigated with sterile saline.  The capsule was repaired with 4-0 Vicryl in a figure-of-eight fashion.  Inverted interrupted Vicryl sutures were placed in subcutaneous tissues and skin was closed with 4-0 nylon in a horizontal mattress fashion.  The dorsal wound was closed also.  The wounds were dressed with sterile Xeroform and 4 x 4's and wrapped with a Kerlix bandage.  Thumb spica splint was placed and wrapped with Kerlix and Ace bandage.  The tourniquet was deflated at 60 minutes.  Fingertips were pink with brisk capillary refill after deflation of tourniquet.  The operative  drapes were broken down.  The patient was awoken from anesthesia safely.  She was transferred back to the stretcher and taken to PACU in stable condition.  I will see her back in the office in 1 week for postoperative followup.  I will give her a prescription for Norco 5/325 1-2 tabs PO q6 hours prn pain, dispense # 25 which she states she has taken in the past without problems.   Leanora Cover, MD Electronically signed, 06/30/22

## 2022-07-01 ENCOUNTER — Encounter (HOSPITAL_BASED_OUTPATIENT_CLINIC_OR_DEPARTMENT_OTHER): Payer: Self-pay | Admitting: Orthopedic Surgery

## 2022-07-01 NOTE — Transfer of Care (Signed)
Immediate Anesthesia Transfer of Care Note  Patient: Chloe Golden  Procedure(s) Performed: RIGHT THUMB TRAPEZIECTOMY AND SUSPENSIONPLASTY (Right: Thumb)  Patient Location: PACU  Anesthesia Type:MAC combined with regional for post-op pain  Level of Consciousness: awake, alert , oriented, and patient cooperative  Airway & Oxygen Therapy: Patient Spontanous Breathing and Patient connected to face mask oxygen  Post-op Assessment: Report given to RN and Post -op Vital signs reviewed and stable  Post vital signs: Reviewed and stable  Last Vitals:  Vitals Value Taken Time  BP 144/75 06/30/22 1620  Temp 36.4 C 06/30/22 1620  Pulse 83 06/30/22 1620  Resp 15 06/30/22 1620  SpO2 90 % 06/30/22 1620    Last Pain:  Vitals:   07/01/22 0853  TempSrc:   PainSc: 4       Patients Stated Pain Goal: 3 (72/90/21 1155)  Complications: No notable events documented.

## 2022-07-07 DIAGNOSIS — M25641 Stiffness of right hand, not elsewhere classified: Secondary | ICD-10-CM | POA: Diagnosis not present

## 2022-07-07 DIAGNOSIS — M180 Bilateral primary osteoarthritis of first carpometacarpal joints: Secondary | ICD-10-CM | POA: Diagnosis not present

## 2022-07-07 DIAGNOSIS — M79644 Pain in right finger(s): Secondary | ICD-10-CM | POA: Diagnosis not present

## 2022-07-07 DIAGNOSIS — Z4889 Encounter for other specified surgical aftercare: Secondary | ICD-10-CM | POA: Diagnosis not present

## 2022-07-08 ENCOUNTER — Encounter (INDEPENDENT_AMBULATORY_CARE_PROVIDER_SITE_OTHER): Payer: Self-pay | Admitting: Internal Medicine

## 2022-07-27 DIAGNOSIS — I700 Atherosclerosis of aorta: Secondary | ICD-10-CM | POA: Insufficient documentation

## 2022-07-27 DIAGNOSIS — I10 Essential (primary) hypertension: Secondary | ICD-10-CM | POA: Diagnosis not present

## 2022-07-27 DIAGNOSIS — G4730 Sleep apnea, unspecified: Secondary | ICD-10-CM | POA: Diagnosis not present

## 2022-07-27 DIAGNOSIS — R61 Generalized hyperhidrosis: Secondary | ICD-10-CM | POA: Diagnosis not present

## 2022-07-27 DIAGNOSIS — R296 Repeated falls: Secondary | ICD-10-CM | POA: Diagnosis not present

## 2022-07-27 DIAGNOSIS — Z79899 Other long term (current) drug therapy: Secondary | ICD-10-CM | POA: Diagnosis not present

## 2022-07-27 DIAGNOSIS — G4733 Obstructive sleep apnea (adult) (pediatric): Secondary | ICD-10-CM | POA: Insufficient documentation

## 2022-07-29 DIAGNOSIS — M25641 Stiffness of right hand, not elsewhere classified: Secondary | ICD-10-CM | POA: Diagnosis not present

## 2022-07-29 DIAGNOSIS — M79644 Pain in right finger(s): Secondary | ICD-10-CM | POA: Diagnosis not present

## 2022-07-29 DIAGNOSIS — M180 Bilateral primary osteoarthritis of first carpometacarpal joints: Secondary | ICD-10-CM | POA: Diagnosis not present

## 2022-08-04 ENCOUNTER — Ambulatory Visit: Payer: 59 | Admitting: Diagnostic Neuroimaging

## 2022-08-07 DIAGNOSIS — M79644 Pain in right finger(s): Secondary | ICD-10-CM | POA: Diagnosis not present

## 2022-08-07 DIAGNOSIS — M180 Bilateral primary osteoarthritis of first carpometacarpal joints: Secondary | ICD-10-CM | POA: Diagnosis not present

## 2022-08-07 DIAGNOSIS — M25641 Stiffness of right hand, not elsewhere classified: Secondary | ICD-10-CM | POA: Diagnosis not present

## 2022-08-12 DIAGNOSIS — M25641 Stiffness of right hand, not elsewhere classified: Secondary | ICD-10-CM | POA: Diagnosis not present

## 2022-08-12 DIAGNOSIS — M180 Bilateral primary osteoarthritis of first carpometacarpal joints: Secondary | ICD-10-CM | POA: Diagnosis not present

## 2022-08-12 DIAGNOSIS — M79644 Pain in right finger(s): Secondary | ICD-10-CM | POA: Diagnosis not present

## 2022-08-13 DIAGNOSIS — G4700 Insomnia, unspecified: Secondary | ICD-10-CM | POA: Diagnosis not present

## 2022-08-13 DIAGNOSIS — G4733 Obstructive sleep apnea (adult) (pediatric): Secondary | ICD-10-CM | POA: Diagnosis not present

## 2022-08-24 DIAGNOSIS — M180 Bilateral primary osteoarthritis of first carpometacarpal joints: Secondary | ICD-10-CM | POA: Diagnosis not present

## 2022-08-24 DIAGNOSIS — M25541 Pain in joints of right hand: Secondary | ICD-10-CM | POA: Diagnosis not present

## 2022-08-25 DIAGNOSIS — Z9189 Other specified personal risk factors, not elsewhere classified: Secondary | ICD-10-CM | POA: Diagnosis not present

## 2022-08-25 DIAGNOSIS — G2581 Restless legs syndrome: Secondary | ICD-10-CM | POA: Diagnosis not present

## 2022-08-25 DIAGNOSIS — G4700 Insomnia, unspecified: Secondary | ICD-10-CM | POA: Diagnosis not present

## 2022-08-25 DIAGNOSIS — G4733 Obstructive sleep apnea (adult) (pediatric): Secondary | ICD-10-CM | POA: Diagnosis not present

## 2022-08-26 DIAGNOSIS — G4733 Obstructive sleep apnea (adult) (pediatric): Secondary | ICD-10-CM | POA: Diagnosis not present

## 2022-08-26 DIAGNOSIS — Z7984 Long term (current) use of oral hypoglycemic drugs: Secondary | ICD-10-CM | POA: Diagnosis not present

## 2022-08-26 DIAGNOSIS — I739 Peripheral vascular disease, unspecified: Secondary | ICD-10-CM | POA: Diagnosis not present

## 2022-08-26 DIAGNOSIS — F418 Other specified anxiety disorders: Secondary | ICD-10-CM | POA: Insufficient documentation

## 2022-08-26 DIAGNOSIS — E1140 Type 2 diabetes mellitus with diabetic neuropathy, unspecified: Secondary | ICD-10-CM | POA: Diagnosis not present

## 2022-08-26 DIAGNOSIS — E119 Type 2 diabetes mellitus without complications: Secondary | ICD-10-CM | POA: Diagnosis not present

## 2022-08-26 DIAGNOSIS — M1990 Unspecified osteoarthritis, unspecified site: Secondary | ICD-10-CM | POA: Diagnosis not present

## 2022-08-26 DIAGNOSIS — E782 Mixed hyperlipidemia: Secondary | ICD-10-CM | POA: Diagnosis not present

## 2022-08-26 DIAGNOSIS — Z79899 Other long term (current) drug therapy: Secondary | ICD-10-CM | POA: Diagnosis not present

## 2022-08-26 DIAGNOSIS — G2581 Restless legs syndrome: Secondary | ICD-10-CM | POA: Diagnosis not present

## 2022-08-26 DIAGNOSIS — I10 Essential (primary) hypertension: Secondary | ICD-10-CM | POA: Diagnosis not present

## 2022-08-26 DIAGNOSIS — Z881 Allergy status to other antibiotic agents status: Secondary | ICD-10-CM | POA: Diagnosis not present

## 2022-08-26 DIAGNOSIS — F32A Depression, unspecified: Secondary | ICD-10-CM | POA: Diagnosis not present

## 2022-08-26 DIAGNOSIS — Z882 Allergy status to sulfonamides status: Secondary | ICD-10-CM | POA: Diagnosis not present

## 2022-08-26 DIAGNOSIS — M069 Rheumatoid arthritis, unspecified: Secondary | ICD-10-CM | POA: Diagnosis not present

## 2022-08-26 DIAGNOSIS — Z888 Allergy status to other drugs, medicaments and biological substances status: Secondary | ICD-10-CM | POA: Diagnosis not present

## 2022-08-26 DIAGNOSIS — Z886 Allergy status to analgesic agent status: Secondary | ICD-10-CM | POA: Diagnosis not present

## 2022-08-26 DIAGNOSIS — G4700 Insomnia, unspecified: Secondary | ICD-10-CM | POA: Diagnosis not present

## 2022-09-02 ENCOUNTER — Ambulatory Visit (INDEPENDENT_AMBULATORY_CARE_PROVIDER_SITE_OTHER): Payer: 59 | Admitting: Internal Medicine

## 2022-09-02 ENCOUNTER — Ambulatory Visit: Payer: Medicaid Other | Admitting: Gastroenterology

## 2022-09-02 ENCOUNTER — Encounter: Payer: Self-pay | Admitting: Internal Medicine

## 2022-09-02 VITALS — BP 109/73 | HR 108 | Temp 98.1°F | Ht 64.0 in | Wt 159.3 lb

## 2022-09-02 DIAGNOSIS — R1319 Other dysphagia: Secondary | ICD-10-CM | POA: Diagnosis not present

## 2022-09-02 DIAGNOSIS — R634 Abnormal weight loss: Secondary | ICD-10-CM

## 2022-09-02 DIAGNOSIS — K582 Mixed irritable bowel syndrome: Secondary | ICD-10-CM

## 2022-09-02 DIAGNOSIS — K219 Gastro-esophageal reflux disease without esophagitis: Secondary | ICD-10-CM | POA: Diagnosis not present

## 2022-09-02 DIAGNOSIS — R110 Nausea: Secondary | ICD-10-CM

## 2022-09-02 NOTE — Patient Instructions (Signed)
I will order a gastric emptying study to check you for gastroparesis which is delayed emptying of your stomach.  We will call with results.  Continue on omeprazole for your acid reflux.  For your constipation continue Amitiza as well as the herbal supplement you are taking.  Follow-up in 3 to 4 months.  It is always a pleasure seeing you.  Dr. Abbey Chatters

## 2022-09-02 NOTE — Progress Notes (Signed)
Referring Provider: Celene Squibb, MD Primary Care Physician:  Celene Squibb, MD Primary GI:  Dr. Abbey Chatters  Chief Complaint  Patient presents with   Gastroesophageal Reflux    Follow up on GERD, constipation. Reports gerd and constipation are both better.  Has concerns about weight loss. Reports since coming off of lyrica she has been losing weight. Eats 2 meals a day.     HPI:   Chloe Golden is a 77 y.o. female who presents to clinic today for follow-up visit.    History of IBS, mixed with constipation and diarrhea.  Underwent colonoscopy 10/20/2021 with multiple tubular adenomas removed, random colon biopsies negative for microscopic colitis.  Recommended 3-year recall  She has had issues of incontinence in the past.  She underwent anorectal manometry in 2014 at [facility].  This revealed pelvic floor dyssynergia.  Was recommended she go to physical therapy for this.  Recently seen by urology as well who again recommended pelvic floor PT.    Status post cholecystectomy.  Duodenal biopsies in the past negative for celiac disease.  After previous visit, I recommended she be more aggressive with taking Imodium scheduled.  She states her symptoms are vastly improved.  Has had 1 day of diarrhea in the last 2 weeks.  Also with chronic GERD and dysphagia. Dysphagia chronic, not improved status post esophageal dilation.  Esophageal dysmotility seen on previous esophagram.  Has been on Pantoprazole, Dexilant, now on Rabeprazole.  Today, GERD doing well on Rabeprazole. Bowels moving well. Taking Amitiza 18mg BID as well as an OTC stool softener but she is not sure which one.   Main concern for me today is ongoing weith loss. Has lost 14 lbs since December. Notes good appetite but not eating as much as she "get's full" easy. Notes stopping Lyrica prior to her weight loss. Notes occasional nausea.  Past Medical History:  Diagnosis Date   Acid reflux    Arthritis    Degenerative arthritis of  cervical spine    Diabetes mellitus without complication (HCC)    Fibromyalgia    Functional gastrointestinal disorder    GERD (gastroesophageal reflux disease)    Glaucoma    Hiatal hernia    Hypertension    Neuropathy    OAB (overactive bladder)    OSA on CPAP    stopped 660moago   PAD (peripheral artery disease) (HCC)    Mild by arterial Dopplers April 2019   Rheumatoid arthritis(714.0)    Venous reflux     Past Surgical History:  Procedure Laterality Date   ABDOMINAL HYSTERECTOMY     BACTERIAL OVERGROWTH TEST N/A 11/09/2012   Procedure: BACTERIAL OVERGROWTH TEST;  Surgeon: SaDanie BinderMD;  [facility];  Service: Endoscopy;  Laterality: N/A;  7:30   BALLOON DILATION N/A 07/07/2021   Procedure: BALLOON DILATION;  Surgeon: CaEloise HarmanDO;  [facility];  Service: Endoscopy;  Laterality: N/A;   BIOPSY N/A 10/28/2012   Procedure: BIOPSY;  Surgeon: SaDanie BinderMD;  [facility];  Service: Endoscopy;  Laterality: N/A;  DUODENAL BIOPSY   BIOPSY  07/07/2021   Procedure: BIOPSY;  Surgeon: CaEloise HarmanDO;  [facility];  Service: Endoscopy;;   BIOPSY  10/20/2021   Procedure: BIOPSY;  Surgeon: CaEloise HarmanDO;  [facility];  Service: Endoscopy;;   BLADDER SURGERY     CARPAL TUNNEL RELEASE Bilateral    CAGunter  Right 06/30/2022   Procedure: RIGHT THUMB TRAPEZIECTOMY AND SUSPENSIONPLASTY;  Surgeon: Leanora Cover, MD;  Location: [facility];  Service: Orthopedics;  Laterality: Right;  90 MIN AUX BLOCK   CHOLECYSTECTOMY     COLONOSCOPY WITH ESOPHAGOGASTRODUODENOSCOPY (EGD) N/A 10/28/2012   [id number] diverticulosis in the descending colon and sigmoid colon/Small internal hemorrhoids   COLONOSCOPY WITH PROPOFOL Left 10/20/2021   Procedure: COLONOSCOPY WITH PROPOFOL;  Surgeon: Eloise Harman, DO;  [facility];  Service: Endoscopy;  Laterality: Left;   7:30am   ESOPHAGOGASTRODUODENOSCOPY N/A 11/27/2016   Procedure: ESOPHAGOGASTRODUODENOSCOPY (EGD);  Surgeon: Danie Binder, MD;  [facility];  Service: Endoscopy;  Laterality: N/A;  830    ESOPHAGOGASTRODUODENOSCOPY (EGD) WITH ESOPHAGEAL DILATION N/A 10/28/2012   [id number] CERVIAL WEB, S/P DILATION/MILD Non-erosive gastritis & DUODENITIS   ESOPHAGOGASTRODUODENOSCOPY (EGD) WITH PROPOFOL N/A 07/07/2021   gastritis s/p biopsy, normal duodenum. dilation.  Negative H.pylori.   POLYPECTOMY  10/20/2021   Procedure: POLYPECTOMY;  Surgeon: Eloise Harman, DO;  [facility];  Service: Endoscopy;;   SAVORY DILATION N/A 11/27/2016   Procedure: SAVORY DILATION;  Surgeon: Danie Binder, MD;  [facility];  Service: Endoscopy;  Laterality: N/A;   TOTAL KNEE ARTHROPLASTY Right     Current Outpatient Medications  Medication Sig Dispense Refill   ACCU-CHEK GUIDE test strip See admin instructions.     ALPRAZolam (XANAX) 0.5 MG tablet Take 0.5-1 mg by mouth See admin instructions. 0.5 mg in the morning, 1 mg at bedtime     amLODipine (NORVASC) 5 MG tablet Take 5 mg by mouth daily.     baclofen (LIORESAL) 10 MG tablet Take 10 mg by mouth 2 (two) times daily.     Coenzyme Q10 (COQ-10) 100 MG CAPS Take 100 mg by mouth daily at 6 (six) AM.     conjugated estrogens (PREMARIN) vaginal cream Place 1 applicator vaginally See admin instructions. Twice a week as directed     Daridorexant HCl (QUVIVIQ) 50 MG TABS Take by mouth.     donepezil (ARICEPT) 10 MG tablet Take 10 mg by mouth daily.     ezetimibe (ZETIA) 10 MG tablet Take 10 mg by mouth at bedtime.      fluticasone (FLONASE) 50 MCG/ACT nasal spray Place into both nostrils daily.     furosemide (LASIX) 20 MG tablet Take 20 mg by mouth daily.     gabapentin (NEURONTIN) 100 MG capsule Take 100 mg by mouth 2 (two) times daily.     GEMTESA 75 MG TABS Take 75 mg by mouth daily.     HYDROcodone-acetaminophen (NORCO) 5-325  MG tablet 1-2 tabs po q6 hours prn pain 25 tablet 0   hydroxychloroquine (PLAQUENIL) 200 MG tablet Take 400 mg by mouth daily.     JANUVIA 100 MG tablet Take 100 mg by mouth daily.     JARDIANCE 10 MG TABS tablet Take 10 mg by mouth daily.     leflunomide (ARAVA) 10 MG tablet Take 10 mg by mouth daily.     loratadine (CLARITIN) 10 MG tablet Take 10 mg by mouth daily.     losartan-hydrochlorothiazide (HYZAAR) 100-12.5 MG tablet Take 1 tablet by mouth daily.     lubiprostone (AMITIZA) 8 MCG capsule Take 1 capsule (8 mcg total) by mouth 2 (two) times daily with a meal. 60 capsule 3   magnesium gluconate (MAGONATE) 500 MG tablet Take 500 mg by mouth 2 (two) times daily.  Multiple Vitamins-Minerals (MULTIVITAMIN WITH MINERALS) tablet Take 1 tablet by mouth daily.     nebivolol (BYSTOLIC) 5 MG tablet Take 5 mg by mouth at bedtime.     Omega-3 Fatty Acids (FISH OIL) 1000 MG CAPS Take 1,000 mg by mouth daily.     ondansetron (ZOFRAN) 4 MG tablet Take 1 tablet (4 mg total) by mouth every 6 (six) hours. (Patient taking differently: Take 4 mg by mouth every 6 (six) hours as needed for nausea or vomiting.) 12 tablet 0   pantoprazole (PROTONIX) 40 MG tablet Take 40 mg by mouth daily.     potassium chloride (KLOR-CON) 10 MEQ tablet Take 10 mEq by mouth daily.     RABEprazole (ACIPHEX) 20 MG tablet TAKE 1 TABLET (20 MG TOTAL) BY MOUTH IN THE MORNING AND AT BEDTIME. 60 tablet 5   rOPINIRole (REQUIP) 0.5 MG tablet Take 1.5 mg by mouth at bedtime.     simvastatin (ZOCOR) 20 MG tablet Take 20 mg by mouth every Monday, Wednesday, and Friday.     solifenacin (VESICARE) 5 MG tablet Take 5 mg by mouth daily. As needed     traZODone (DESYREL) 50 MG tablet Take 50 mg by mouth at bedtime.     TURMERIC CURCUMIN PO Take 1,000 mg by mouth daily.     vortioxetine HBr (TRINTELLIX) 10 MG TABS tablet Take 10 mg by mouth daily.     No current facility-administered medications for this visit.    Allergies as of 09/02/2022  - Review Complete 09/02/2022  Allergen Reaction Noted   Codeine Other (See Comments) 09/26/2012   Dicyclomine  01/05/2013   Ibuprofen Other (See Comments) 09/26/2012   Sulfa antibiotics  11/24/2016   Valium [diazepam] Other (See Comments) 09/26/2012    Family History  Problem Relation Age of Onset   Lung cancer Father    Diabetes Mellitus II Father    Crohn's disease Cousin    Hypertension Mother    Cystic fibrosis Mother    Lung cancer Brother    Hypertension Brother    Lupus Niece    Colon cancer Neg Hx    Celiac disease Neg Hx    Gastric cancer Neg Hx    Esophageal cancer Neg Hx     Social History   Socioeconomic History   Marital status: Single    Spouse name: Not on file   Number of children: Not on file   Years of education: Not on file   Highest education level: Not on file  Occupational History   Not on file  Tobacco Use   Smoking status: Never    Passive exposure: Past   Smokeless tobacco: Never  Vaping Use   Vaping Use: Never used  Substance and Sexual Activity   Alcohol use: No   Drug use: No   Sexual activity: Yes    Birth control/protection: Surgical    Comment: hyst  Other Topics Concern   Not on file  Social History Narrative   1 living   2 deceased   Social Determinants of Health   Financial Resource Strain: Not on file  Food Insecurity: Not on file  Transportation Needs: Not on file  Physical Activity: Not on file  Stress: Not on file  Social Connections: Not on file    Subjective: Review of Systems  Constitutional:  Negative for chills and fever.  HENT:  Negative for congestion and hearing loss.   Eyes:  Negative for blurred vision and double vision.  Respiratory:  Negative  for cough and shortness of breath.   Cardiovascular:  Negative for chest pain and palpitations.  Gastrointestinal:  Positive for diarrhea. Negative for abdominal pain, blood in stool, constipation, heartburn, melena and vomiting.       Fecal incontinence   Genitourinary:  Negative for dysuria and urgency.  Musculoskeletal:  Negative for joint pain and myalgias.  Skin:  Negative for itching and rash.  Neurological:  Negative for dizziness and headaches.  Psychiatric/Behavioral:  Negative for depression. The patient is not nervous/anxious.      Objective: BP 109/73 (BP Location: Left Arm, Patient Position: Sitting, Cuff Size: Large)   Pulse (!) 108   Temp 98.1 F (36.7 C) (Oral)   Ht '5\' 4"'$  (1.626 m)   Wt 159 lb 4.8 oz (72.3 kg)   BMI 27.34 kg/m  Physical Exam Constitutional:      Appearance: Normal appearance.  HENT:     Head: Normocephalic and atraumatic.  Eyes:     Extraocular Movements: Extraocular movements intact.     Conjunctiva/sclera: Conjunctivae normal.  Cardiovascular:     Rate and Rhythm: Normal rate and regular rhythm.  Pulmonary:     Effort: Pulmonary effort is normal.     Breath sounds: Normal breath sounds.  Abdominal:     General: Bowel sounds are normal.     Palpations: Abdomen is soft.  Musculoskeletal:        General: No swelling. Normal range of motion.     Cervical back: Normal range of motion and neck supple.  Skin:    General: Skin is warm and dry.     Coloration: Skin is not jaundiced.  Neurological:     General: No focal deficit present.     Mental Status: She is alert and oriented to person, place, and time.  Psychiatric:        Mood and Affect: Mood normal.        Behavior: Behavior normal.      Assessment: *Irritable bowel syndrome with constipation and diarrhea *Chronic GERD *Dysphagia-chronic, due to dysmotility *Weight loss *Nausea-intermittent  Plan: Patient's IBS appears to be well-controlled diet present with Amitiza twice daily as well as over-the-counter herbal gut health supplement.  Dysphagia chronic, not improved status post esophageal dilation.  Likely due to esophageal dysmotility which was seen on previous esophagram.  Continue conservative management for this.     Patient to avoid tough textures. All meats should be chopped finely. Eat slowly, take small bites, chew thoroughly, and drink plenty of liquids throughout meals.   GERD well-controlled on rabeprazole twice daily.  Will continue.  In regards to her nausea and weight loss, may have a component of diabetic gastroparesis.  Will order gastric emptying study today.  Follow-up 3-4 months  09/02/2022 3:07 PM   Disclaimer: This note was dictated with voice recognition software. Similar sounding words can inadvertently be transcribed and may not be corrected upon review.

## 2022-09-03 ENCOUNTER — Other Ambulatory Visit (INDEPENDENT_AMBULATORY_CARE_PROVIDER_SITE_OTHER): Payer: Self-pay | Admitting: Internal Medicine

## 2022-09-03 ENCOUNTER — Telehealth (INDEPENDENT_AMBULATORY_CARE_PROVIDER_SITE_OTHER): Payer: Self-pay | Admitting: Internal Medicine

## 2022-09-03 DIAGNOSIS — K219 Gastro-esophageal reflux disease without esophagitis: Secondary | ICD-10-CM

## 2022-09-03 NOTE — Telephone Encounter (Signed)
GES scheduled for 09/10/22 at 9:00AM. Hold stomach meds that morning. NPO that morning. Pt will be with them up to 4 hours. Pt contacted and made aware of appt.   Per [id number] or Prior Authorization is not required for the requested services. You are not required to submit a notification/prior authorization based on the information provided. The number above acknowledges your inquiry and our response. Please write this number down and refer to it for future inquiries. If you still wish to submit your request for review, please select the Continue with Submission button below.  Decision ID #: [id number]

## 2022-09-08 DIAGNOSIS — G4733 Obstructive sleep apnea (adult) (pediatric): Secondary | ICD-10-CM | POA: Diagnosis not present

## 2022-09-10 ENCOUNTER — Encounter (HOSPITAL_COMMUNITY)
Admission: RE | Admit: 2022-09-10 | Discharge: 2022-09-10 | Disposition: A | Discharge status: Home / Self Care | Payer: 59 | Source: Ambulatory Visit | Attending: Internal Medicine | Admitting: Internal Medicine

## 2022-09-10 DIAGNOSIS — K3184 Gastroparesis: Secondary | ICD-10-CM | POA: Diagnosis not present

## 2022-09-10 DIAGNOSIS — K219 Gastro-esophageal reflux disease without esophagitis: Secondary | ICD-10-CM | POA: Diagnosis not present

## 2022-09-10 MED ORDER — TECHNETIUM TC 99M SULFUR COLLOID
2.0000 | Freq: Once | INTRAVENOUS | Status: AC | PRN
  Administered 2022-09-10: 1.91 via INTRAVENOUS

## 2022-09-18 ENCOUNTER — Other Ambulatory Visit: Payer: Self-pay | Admitting: Gastroenterology

## 2022-09-21 DIAGNOSIS — M797 Fibromyalgia: Secondary | ICD-10-CM | POA: Diagnosis not present

## 2022-09-21 DIAGNOSIS — Z96651 Presence of right artificial knee joint: Secondary | ICD-10-CM | POA: Diagnosis not present

## 2022-09-21 DIAGNOSIS — K219 Gastro-esophageal reflux disease without esophagitis: Secondary | ICD-10-CM | POA: Diagnosis not present

## 2022-09-21 DIAGNOSIS — M1990 Unspecified osteoarthritis, unspecified site: Secondary | ICD-10-CM | POA: Diagnosis not present

## 2022-09-21 DIAGNOSIS — M79644 Pain in right finger(s): Secondary | ICD-10-CM | POA: Diagnosis not present

## 2022-09-21 DIAGNOSIS — Z79899 Other long term (current) drug therapy: Secondary | ICD-10-CM | POA: Diagnosis not present

## 2022-09-21 DIAGNOSIS — M0609 Rheumatoid arthritis without rheumatoid factor, multiple sites: Secondary | ICD-10-CM | POA: Diagnosis not present

## 2022-09-21 DIAGNOSIS — M3505 Sjogren syndrome with inflammatory arthritis: Secondary | ICD-10-CM | POA: Diagnosis not present

## 2022-09-23 ENCOUNTER — Telehealth: Payer: Self-pay

## 2022-09-23 NOTE — Telephone Encounter (Signed)
PA came for the pt's Lubiprostone 45mcg. Cover my meds documentation stating PA not required. Faxed the Rx and documentation from Cover My [facility]s to pharmacy @ [facility] in [place]

## 2022-09-24 DIAGNOSIS — R350 Frequency of micturition: Secondary | ICD-10-CM | POA: Diagnosis not present

## 2022-09-28 ENCOUNTER — Encounter: Payer: Self-pay | Admitting: Diagnostic Neuroimaging

## 2022-09-28 ENCOUNTER — Ambulatory Visit (INDEPENDENT_AMBULATORY_CARE_PROVIDER_SITE_OTHER): Payer: 59 | Admitting: Diagnostic Neuroimaging

## 2022-09-28 VITALS — BP 124/78 | HR 86 | Ht 64.0 in | Wt 158.0 lb

## 2022-09-28 DIAGNOSIS — R413 Other amnesia: Secondary | ICD-10-CM

## 2022-09-28 DIAGNOSIS — R269 Unspecified abnormalities of gait and mobility: Secondary | ICD-10-CM | POA: Diagnosis not present

## 2022-09-28 DIAGNOSIS — E1142 Type 2 diabetes mellitus with diabetic polyneuropathy: Secondary | ICD-10-CM

## 2022-09-28 NOTE — Progress Notes (Signed)
[facility]  PATIENT: LARONDA CASSARINO DOB: 01/31/1946  REFERRING CLINICIAN: Leone Payor, FNP HISTORY FROM: patient  REASON FOR VISIT: new consult   HISTORICAL  CHIEF COMPLAINT:  Chief Complaint  Patient presents with   Numbness    Rm 6 alone Pt is well, reports she had about 8 times last year, none this year. She hit her head about 3 times. She is having some tingling and burning in feet radiating up her legs since January. She also mentions tingling in L arm at night.     HISTORY OF PRESENT ILLNESS:   77 year old female here for evaluation of head injuries, falls, neuropathy.  Patient 3 patient was having increasing problems with balance and walking.  She had multiple falls, up to 8 times over 6 months.  She hit her head several times.  Balance issues related to 20-year history of neuropathy.  Also with fibromyalgia, rheumatoid arthritis and osteoarthritis.  Also with insomnia and polypharmacy.  Since her medications have been reduced she feels more clearheaded and balance is improved.  No ongoing sequelae from her head injuries.  Previously was having some issues with memory loss related to chronic pain and insomnia.  Patient was on room.  Able to maintain all of her ADLs.   REVIEW OF SYSTEMS: Full 14 system review of systems performed and negative with exception of: as per HPI.  ALLERGIES: Allergies  Allergen Reactions   Trazodone Other (See Comments)   Codeine Other (See Comments)    Constipation   Dicyclomine     TURNED HER SKIN BLUE   Ibuprofen Other (See Comments)    Ulcers    Sulfa Antibiotics     Yeast infections    Valium [Diazepam] Other (See Comments)    Alters mental status    HOME MEDICATIONS: Outpatient Medications Prior to Visit  Medication Sig Dispense Refill   ACCU-CHEK GUIDE test strip See admin instructions.     ALPRAZolam (XANAX) 0.5 MG tablet Take 0.5-1 mg by mouth See admin instructions. 0.5 mg in the morning, 1 mg at  bedtime     amLODipine (NORVASC) 5 MG tablet Take 5 mg by mouth daily.     baclofen (LIORESAL) 10 MG tablet Take 10 mg by mouth 2 (two) times daily.     cetirizine (ZYRTEC) 10 MG tablet Take 10 mg by mouth daily.     Coenzyme Q10 (COQ-10) 100 MG CAPS Take 100 mg by mouth daily at 6 (six) AM.     conjugated estrogens (PREMARIN) vaginal cream Place 1 applicator vaginally See admin instructions. Twice a week as directed     Daridorexant HCl (QUVIVIQ) 50 MG TABS Take by mouth.     estrogens, conjugated, (PREMARIN) 0.625 MG tablet Take 0.625 mg by mouth daily.     ezetimibe (ZETIA) 10 MG tablet Take 10 mg by mouth at bedtime.      fluticasone (FLONASE) 50 MCG/ACT nasal spray Place into both nostrils daily.     furosemide (LASIX) 20 MG tablet Take 20 mg by mouth daily.     gabapentin (NEURONTIN) 100 MG capsule Take 100 mg by mouth 2 (two) times daily.     GEMTESA 75 MG TABS Take 75 mg by mouth daily.     HYDROcodone-acetaminophen (NORCO) 5-325 MG tablet 1-2 tabs po q6 hours prn pain 25 tablet 0   hydroxychloroquine (PLAQUENIL) 200 MG tablet Take 400 mg by mouth daily.     JANUVIA 100 MG tablet Take 100 mg by mouth daily.  JARDIANCE 10 MG TABS tablet Take 10 mg by mouth daily.     leflunomide (ARAVA) 10 MG tablet Take 10 mg by mouth daily.     loratadine (CLARITIN) 10 MG tablet Take 10 mg by mouth daily.     losartan-hydrochlorothiazide (HYZAAR) 100-12.5 MG tablet Take 1 tablet by mouth daily.     lubiprostone (AMITIZA) 8 MCG capsule TAKE 1 CAPSULE (8 MCG TOTAL) BY MOUTH 2 (TWO) TIMES DAILY WITH A MEAL. 60 capsule 3   magnesium gluconate (MAGONATE) 500 MG tablet Take 500 mg by mouth 2 (two) times daily.     Multiple Vitamins-Minerals (MULTIVITAMIN WITH MINERALS) tablet Take 1 tablet by mouth daily.     nebivolol (BYSTOLIC) 5 MG tablet Take 5 mg by mouth at bedtime.     nitrofurantoin (MACRODANTIN) 100 MG capsule Take 1 capsule by mouth 2 (two) times daily.     Omega-3 Fatty Acids (FISH OIL) 1000  MG CAPS Take 1,000 mg by mouth daily.     ondansetron (ZOFRAN) 4 MG tablet Take 1 tablet (4 mg total) by mouth every 6 (six) hours. (Patient taking differently: Take 4 mg by mouth every 6 (six) hours as needed for nausea or vomiting.) 12 tablet 0   pantoprazole (PROTONIX) 40 MG tablet Take 1 tablet by mouth 2 (two) times daily before a meal.     potassium chloride (KLOR-CON) 10 MEQ tablet Take 10 mEq by mouth daily.     pregabalin (LYRICA) 50 MG capsule Take 50 mg by mouth 2 (two) times daily.     propranolol (INDERAL) 10 MG tablet Take 10 mg by mouth daily.     RABEprazole (ACIPHEX) 20 MG tablet TAKE 1 TABLET (20 MG TOTAL) BY MOUTH IN THE MORNING AND AT BEDTIME. 60 tablet 5   rOPINIRole (REQUIP) 0.5 MG tablet Take 1.5 mg by mouth at bedtime.     simvastatin (ZOCOR) 20 MG tablet Take 20 mg by mouth every Monday, Wednesday, and Friday.     traZODone (DESYREL) 50 MG tablet Take 50 mg by mouth at bedtime.     TURMERIC CURCUMIN PO Take 1,000 mg by mouth daily.     vortioxetine HBr (TRINTELLIX) 10 MG TABS tablet Take 10 mg by mouth daily.     vortioxetine HBr (TRINTELLIX) 10 MG TABS tablet Take 1 tablet by mouth daily.     donepezil (ARICEPT) 10 MG tablet Take 10 mg by mouth daily.     solifenacin (VESICARE) 5 MG tablet Take 5 mg by mouth daily. As needed (Patient not taking: Reported on 09/28/2022)     No facility-administered medications prior to visit.    PAST MEDICAL HISTORY: Past Medical History:  Diagnosis Date   Acid reflux    Arthritis    Degenerative arthritis of cervical spine    Diabetes mellitus without complication    Fibromyalgia    Functional gastrointestinal disorder    GERD (gastroesophageal reflux disease)    Glaucoma    Hiatal hernia    Hypertension    Neuropathy    OAB (overactive bladder)    OSA on CPAP    stopped 6mos ago   PAD (peripheral artery disease)    Mild by arterial Dopplers April 2019   Rheumatoid arthritis(714.0)    Venous reflux     PAST SURGICAL  HISTORY: Past Surgical History:  Procedure Laterality Date   ABDOMINAL HYSTERECTOMY     BACTERIAL OVERGROWTH TEST N/A 11/09/2012   Procedure: BACTERIAL OVERGROWTH TEST;  Surgeon: West Bali, MD;  [facility];  Service: Endoscopy;  Laterality: N/A;  7:30   BALLOON DILATION N/A 07/07/2021   Procedure: BALLOON DILATION;  Surgeon: Lanelle Bal, DO;  [facility];  Service: Endoscopy;  Laterality: N/A;   BIOPSY N/A 10/28/2012   Procedure: BIOPSY;  Surgeon: West Bali, MD;  [facility];  Service: Endoscopy;  Laterality: N/A;  DUODENAL BIOPSY   BIOPSY  07/07/2021   Procedure: BIOPSY;  Surgeon: Lanelle Bal, DO;  [facility];  Service: Endoscopy;;   BIOPSY  10/20/2021   Procedure: BIOPSY;  Surgeon: Lanelle Bal, DO;  [facility];  Service: Endoscopy;;   BLADDER SURGERY     CARPAL TUNNEL RELEASE Bilateral    CARPOMETACARPEL SUSPENSION PLASTY Right 06/30/2022   Procedure: RIGHT THUMB TRAPEZIECTOMY AND SUSPENSIONPLASTY;  Surgeon: Betha Loa, MD;  Location: [facility];  Service: Orthopedics;  Laterality: Right;  90 MIN AUX BLOCK   CHOLECYSTECTOMY     COLONOSCOPY WITH ESOPHAGOGASTRODUODENOSCOPY (EGD) N/A 10/28/2012   OEV:[id number] diverticulosis in the descending colon and sigmoid colon/Small internal hemorrhoids   COLONOSCOPY WITH PROPOFOL Left 10/20/2021   Procedure: COLONOSCOPY WITH PROPOFOL;  Surgeon: Lanelle Bal, DO;  [facility];  Service: Endoscopy;  Laterality: Left;  7:30am   ESOPHAGOGASTRODUODENOSCOPY N/A 11/27/2016   Procedure: ESOPHAGOGASTRODUODENOSCOPY (EGD);  Surgeon: West Bali, MD;  [facility];  Service: Endoscopy;  Laterality: N/A;  830    ESOPHAGOGASTRODUODENOSCOPY (EGD) WITH ESOPHAGEAL DILATION N/A 10/28/2012   HWE:[id number] CERVIAL WEB, S/P DILATION/MILD Non-erosive gastritis & DUODENITIS   ESOPHAGOGASTRODUODENOSCOPY (EGD) WITH PROPOFOL N/A 07/07/2021    gastritis s/p biopsy, normal duodenum. dilation.  Negative H.pylori.   POLYPECTOMY  10/20/2021   Procedure: POLYPECTOMY;  Surgeon: Lanelle Bal, DO;  [facility];  Service: Endoscopy;;   SAVORY DILATION N/A 11/27/2016   Procedure: SAVORY DILATION;  Surgeon: West Bali, MD;  [facility];  Service: Endoscopy;  Laterality: N/A;   TOTAL KNEE ARTHROPLASTY Right     FAMILY HISTORY: Family History  Problem Relation Age of Onset   Lung cancer Father    Diabetes Mellitus II Father    Crohn's disease Cousin    Hypertension Mother    Cystic fibrosis Mother    Lung cancer Brother    Hypertension Brother    Lupus Niece    Colon cancer Neg Hx    Celiac disease Neg Hx    Gastric cancer Neg Hx    Esophageal cancer Neg Hx     SOCIAL HISTORY: Social History   Socioeconomic History   Marital status: Single    Spouse name: Not on file   Number of children: Not on file   Years of education: Not on file   Highest education level: Not on file  Occupational History   Not on file  Tobacco Use   Smoking status: Never    Passive exposure: Past   Smokeless tobacco: Never  Vaping Use   Vaping Use: Never used  Substance and Sexual Activity   Alcohol use: No   Drug use: No   Sexual activity: Yes    Birth control/protection: Surgical    Comment: hyst  Other Topics Concern   Not on file  Social History Narrative   1 living   2 deceased   Social Determinants of Health   Financial Resource Strain: Not on file  Food Insecurity: Not on file  Transportation Needs: Not on file  Physical Activity: Not on file  Stress: Not on file  Social Connections: Not on file  Intimate Partner Violence: Not on file     PHYSICAL EXAM  GENERAL EXAM/CONSTITUTIONAL: Vitals:  Vitals:   09/28/22 1130  BP: 124/78  Pulse: 86  Weight: 158 lb (71.7 kg)  Height: 5\' 4"  (1.626 m)   Body mass index is 27.12 kg/m. Wt Readings from Last 3 Encounters:  09/28/22 158 lb  (71.7 kg)  09/02/22 159 lb 4.8 oz (72.3 kg)  06/30/22 167 lb 15.9 oz (76.2 kg)   Patient is in no distress; well developed, nourished and groomed; neck is supple  CARDIOVASCULAR: Examination of carotid arteries is normal; no carotid bruits Regular rate and rhythm, no murmurs Examination of peripheral vascular system by observation and palpation is normal  EYES: Ophthalmoscopic exam of optic discs and posterior segments is normal; no papilledema or hemorrhages No results found.  MUSCULOSKELETAL: Gait, strength, tone, movements noted in Neurologic exam below  NEUROLOGIC: MENTAL STATUS:      No data to display         awake, alert, oriented to person, place and time recent and remote memory intact normal attention and concentration language fluent, comprehension intact, naming intact fund of knowledge appropriate  CRANIAL NERVE:  2nd - no papilledema on fundoscopic exam 2nd, 3rd, 4th, 6th - pupils equal and reactive to light, visual fields full to confrontation, extraocular muscles intact, no nystagmus 5th - facial sensation symmetric 7th - facial strength symmetric 8th - hearing intact 9th - palate elevates symmetrically, uvula midline 11th - shoulder shrug symmetric 12th - tongue protrusion midline  MOTOR:  normal bulk and tone, full strength in the BUE, BLE  SENSORY:  normal and symmetric to light touch, pinprick, temperature, vibration; EXCEPT DECR PP IN FEET/ ANKLES  COORDINATION:  finger-nose-finger, fine finger movements normal  REFLEXES:  deep tendon reflexes TRACE and symmetric  GAIT/STATION:  narrow based gait     DIAGNOSTIC DATA (LABS, IMAGING, TESTING) - I reviewed patient records, labs, notes, testing and imaging myself where available.  Lab Results  Component Value Date   WBC 6.3 02/09/2022   HGB 14.6 02/09/2022   HCT 46.1 (H) 02/09/2022   MCV 93.7 02/09/2022   PLT 268 02/09/2022      Component Value Date/Time   NA 139 06/30/2022  1215   K 3.2 (L) 06/30/2022 1215   CL 101 06/30/2022 1215   CO2 27 06/30/2022 1215   GLUCOSE 88 06/30/2022 1215   BUN 13 06/30/2022 1215   CREATININE 0.85 06/30/2022 1215   CALCIUM 9.2 06/30/2022 1215   PROT 7.4 02/09/2022 1318   ALBUMIN 3.9 02/09/2022 1318   AST 23 02/09/2022 1318   ALT 29 02/09/2022 1318   ALKPHOS 83 02/09/2022 1318   BILITOT 0.3 02/09/2022 1318   GFRNONAA >60 06/30/2022 1215   GFRAA 82 (L) 09/26/2012 2027   No results found for: "CHOL", "HDL", "LDLCALC", "LDLDIRECT", "TRIG", "CHOLHDL" No results found for: "HGBA1C" No results found for: "VITAMINB12" No results found for: "TSH"     ASSESSMENT AND PLAN  77 y.o. year old female here with:   Dx:  1. Gait difficulty   2. Memory loss   3. Diabetic polyneuropathy associated with type 2 diabetes mellitus     PLAN:  RECURRENT FALLS (previously; due to neuropathy and polypharmacy) - now improved; monitor  NEUROPATHY (diabetic) - continue supportive care; gabapentin 100mg  twice a day   FIBROMYALGIA / RHEUMATOID ARTHRITIS / OSTEOARTHRITIS - continue supportive  care; follow up with Dr. Deanne CofferAryal  MEMORY LOSS  - due to chronic pain and insomnia; continue supportive care - ok to stop donepezil  Return for return to PCP.    Suanne MarkerVIKRAM R. Keimon Basaldua, MD 09/28/2022, 12:10 PM Certified in Neurology, Neurophysiology and Neuroimaging  [facility] [address], [address] [place], [place] [id number] [phone]

## 2022-09-28 NOTE — Patient Instructions (Addendum)
  RECURRENT FALLS (previously; due to neuropathy and polypharmacy) - now improved; monitor  NEUROPATHY (diabetic) - continue supportive care  FIBROMYALGIA / RHEUMATOID ARTHRITIS / OSTEOARTHRITIS - continue supportive care; follow up with Dr. Deanne Coffer  MEMORY LOSS  - due to chronic pain and insomnia; continue supportive care - ok to stop donepezil

## 2022-09-29 DIAGNOSIS — M25561 Pain in right knee: Secondary | ICD-10-CM | POA: Diagnosis not present

## 2022-09-29 DIAGNOSIS — M62561 Muscle wasting and atrophy, not elsewhere classified, right lower leg: Secondary | ICD-10-CM | POA: Diagnosis not present

## 2022-09-29 DIAGNOSIS — M25661 Stiffness of right knee, not elsewhere classified: Secondary | ICD-10-CM | POA: Diagnosis not present

## 2022-09-29 DIAGNOSIS — Z96651 Presence of right artificial knee joint: Secondary | ICD-10-CM | POA: Diagnosis not present

## 2022-10-05 DIAGNOSIS — M25661 Stiffness of right knee, not elsewhere classified: Secondary | ICD-10-CM | POA: Diagnosis not present

## 2022-10-05 DIAGNOSIS — Z96651 Presence of right artificial knee joint: Secondary | ICD-10-CM | POA: Diagnosis not present

## 2022-10-05 DIAGNOSIS — M62561 Muscle wasting and atrophy, not elsewhere classified, right lower leg: Secondary | ICD-10-CM | POA: Diagnosis not present

## 2022-10-05 DIAGNOSIS — M180 Bilateral primary osteoarthritis of first carpometacarpal joints: Secondary | ICD-10-CM | POA: Diagnosis not present

## 2022-10-05 DIAGNOSIS — M25561 Pain in right knee: Secondary | ICD-10-CM | POA: Diagnosis not present

## 2022-10-08 DIAGNOSIS — M25661 Stiffness of right knee, not elsewhere classified: Secondary | ICD-10-CM | POA: Diagnosis not present

## 2022-10-08 DIAGNOSIS — Z96651 Presence of right artificial knee joint: Secondary | ICD-10-CM | POA: Diagnosis not present

## 2022-10-08 DIAGNOSIS — M25561 Pain in right knee: Secondary | ICD-10-CM | POA: Diagnosis not present

## 2022-10-08 DIAGNOSIS — M62561 Muscle wasting and atrophy, not elsewhere classified, right lower leg: Secondary | ICD-10-CM | POA: Diagnosis not present

## 2022-10-14 DIAGNOSIS — N390 Urinary tract infection, site not specified: Secondary | ICD-10-CM | POA: Diagnosis not present

## 2022-10-14 DIAGNOSIS — Z01818 Encounter for other preprocedural examination: Secondary | ICD-10-CM | POA: Diagnosis not present

## 2022-10-14 DIAGNOSIS — G4733 Obstructive sleep apnea (adult) (pediatric): Secondary | ICD-10-CM | POA: Diagnosis not present

## 2022-10-14 DIAGNOSIS — M25561 Pain in right knee: Secondary | ICD-10-CM | POA: Diagnosis not present

## 2022-10-14 DIAGNOSIS — M25661 Stiffness of right knee, not elsewhere classified: Secondary | ICD-10-CM | POA: Diagnosis not present

## 2022-10-14 DIAGNOSIS — M62561 Muscle wasting and atrophy, not elsewhere classified, right lower leg: Secondary | ICD-10-CM | POA: Diagnosis not present

## 2022-10-14 DIAGNOSIS — Z96651 Presence of right artificial knee joint: Secondary | ICD-10-CM | POA: Diagnosis not present

## 2022-10-16 DIAGNOSIS — M25661 Stiffness of right knee, not elsewhere classified: Secondary | ICD-10-CM | POA: Diagnosis not present

## 2022-10-16 DIAGNOSIS — I10 Essential (primary) hypertension: Secondary | ICD-10-CM | POA: Diagnosis not present

## 2022-10-16 DIAGNOSIS — Z96651 Presence of right artificial knee joint: Secondary | ICD-10-CM | POA: Diagnosis not present

## 2022-10-16 DIAGNOSIS — E1165 Type 2 diabetes mellitus with hyperglycemia: Secondary | ICD-10-CM | POA: Diagnosis not present

## 2022-10-16 DIAGNOSIS — M62561 Muscle wasting and atrophy, not elsewhere classified, right lower leg: Secondary | ICD-10-CM | POA: Diagnosis not present

## 2022-10-16 DIAGNOSIS — M25561 Pain in right knee: Secondary | ICD-10-CM | POA: Diagnosis not present

## 2022-10-19 DIAGNOSIS — M25561 Pain in right knee: Secondary | ICD-10-CM | POA: Diagnosis not present

## 2022-10-19 DIAGNOSIS — M25661 Stiffness of right knee, not elsewhere classified: Secondary | ICD-10-CM | POA: Diagnosis not present

## 2022-10-19 DIAGNOSIS — Z96651 Presence of right artificial knee joint: Secondary | ICD-10-CM | POA: Diagnosis not present

## 2022-10-19 DIAGNOSIS — M62561 Muscle wasting and atrophy, not elsewhere classified, right lower leg: Secondary | ICD-10-CM | POA: Diagnosis not present

## 2022-10-21 DIAGNOSIS — Z0000 Encounter for general adult medical examination without abnormal findings: Secondary | ICD-10-CM | POA: Diagnosis not present

## 2022-10-22 DIAGNOSIS — R296 Repeated falls: Secondary | ICD-10-CM | POA: Diagnosis not present

## 2022-10-22 DIAGNOSIS — G4700 Insomnia, unspecified: Secondary | ICD-10-CM | POA: Diagnosis not present

## 2022-10-22 DIAGNOSIS — J302 Other seasonal allergic rhinitis: Secondary | ICD-10-CM | POA: Diagnosis not present

## 2022-10-22 DIAGNOSIS — E1140 Type 2 diabetes mellitus with diabetic neuropathy, unspecified: Secondary | ICD-10-CM | POA: Diagnosis not present

## 2022-10-22 DIAGNOSIS — G4730 Sleep apnea, unspecified: Secondary | ICD-10-CM | POA: Diagnosis not present

## 2022-10-22 DIAGNOSIS — E1165 Type 2 diabetes mellitus with hyperglycemia: Secondary | ICD-10-CM | POA: Diagnosis not present

## 2022-10-22 DIAGNOSIS — R61 Generalized hyperhidrosis: Secondary | ICD-10-CM | POA: Diagnosis not present

## 2022-10-22 DIAGNOSIS — M25661 Stiffness of right knee, not elsewhere classified: Secondary | ICD-10-CM | POA: Diagnosis not present

## 2022-10-22 DIAGNOSIS — M25561 Pain in right knee: Secondary | ICD-10-CM | POA: Diagnosis not present

## 2022-10-22 DIAGNOSIS — M62561 Muscle wasting and atrophy, not elsewhere classified, right lower leg: Secondary | ICD-10-CM | POA: Diagnosis not present

## 2022-10-22 DIAGNOSIS — Z96651 Presence of right artificial knee joint: Secondary | ICD-10-CM | POA: Diagnosis not present

## 2022-10-22 DIAGNOSIS — R251 Tremor, unspecified: Secondary | ICD-10-CM | POA: Diagnosis not present

## 2022-10-22 DIAGNOSIS — I10 Essential (primary) hypertension: Secondary | ICD-10-CM | POA: Diagnosis not present

## 2022-10-23 DIAGNOSIS — E119 Type 2 diabetes mellitus without complications: Secondary | ICD-10-CM | POA: Diagnosis not present

## 2022-10-23 DIAGNOSIS — H524 Presbyopia: Secondary | ICD-10-CM | POA: Diagnosis not present

## 2022-10-30 DIAGNOSIS — Z96651 Presence of right artificial knee joint: Secondary | ICD-10-CM | POA: Diagnosis not present

## 2022-10-30 DIAGNOSIS — M62561 Muscle wasting and atrophy, not elsewhere classified, right lower leg: Secondary | ICD-10-CM | POA: Diagnosis not present

## 2022-10-30 DIAGNOSIS — M25561 Pain in right knee: Secondary | ICD-10-CM | POA: Diagnosis not present

## 2022-10-30 DIAGNOSIS — M25661 Stiffness of right knee, not elsewhere classified: Secondary | ICD-10-CM | POA: Diagnosis not present

## 2022-11-02 ENCOUNTER — Telehealth: Payer: Self-pay

## 2022-11-02 NOTE — Telephone Encounter (Signed)
Received TOC records from [facility] for sleep apnea, inspire. Placed in sleep mailbox

## 2022-11-03 ENCOUNTER — Ambulatory Visit (INDEPENDENT_AMBULATORY_CARE_PROVIDER_SITE_OTHER): Payer: 59 | Admitting: Podiatry

## 2022-11-03 DIAGNOSIS — E1142 Type 2 diabetes mellitus with diabetic polyneuropathy: Secondary | ICD-10-CM | POA: Diagnosis not present

## 2022-11-03 MED ORDER — GABAPENTIN 100 MG PO CAPS: 100.0000 mg | ORAL_CAPSULE | Freq: Two times a day (BID) | ORAL | 3 refills | Status: DC

## 2022-11-03 NOTE — Progress Notes (Signed)
Chief Complaint  Patient presents with   Numbness    RM 22  Bilateral burning sensation at the bottom of both feet. Pt states sensations have increased. Pt is currently taking Gabapentin 100mg  bid.     HPI: 77 y.o. female presenting today for consultation about her peripheral neuropathy in her legs.  She notes it has been ongoing for the past couple years but has noticeably been spreading up her legs from her toes in the past 6 months.  She has seen other specialist and is currently taking gabapentin 100 mg twice daily.  She notes that her neuropathy symptoms are worse at bedtime.  She notes the 100 mg gabapentin is not giving her enough relief to be able to go to sleep.  She notes that this is affecting her ability to get restful sleep and then is tired the next day.  She states that at some point in the past she was taking 900 mg of gabapentin and stated that she felt like a zombie so she did stop taking it for a brief period at that time.  She also has tried Lyrica but had to many side effects and had to discontinue that medication.  She does not recall any other neuropathy medications that she is taken in the past  Past Medical History:  Diagnosis Date   Acid reflux    Arthritis    Degenerative arthritis of cervical spine    Diabetes mellitus without complication (HCC)    Fibromyalgia    Functional gastrointestinal disorder    GERD (gastroesophageal reflux disease)    Glaucoma    Hiatal hernia    Hypertension    Neuropathy    OAB (overactive bladder)    OSA on CPAP    stopped 6mos ago   PAD (peripheral artery disease) (HCC)    Mild by arterial Dopplers April 2019   Rheumatoid arthritis(714.0)    Venous reflux     Past Surgical History:  Procedure Laterality Date   ABDOMINAL HYSTERECTOMY     BACTERIAL OVERGROWTH TEST N/A 11/09/2012   Procedure: BACTERIAL OVERGROWTH TEST;  Surgeon: West Bali, MD;  [facility];  Service: Endoscopy;  Laterality: N/A;   7:30   BALLOON DILATION N/A 07/07/2021   Procedure: BALLOON DILATION;  Surgeon: Lanelle Bal, DO;  [facility];  Service: Endoscopy;  Laterality: N/A;   BIOPSY N/A 10/28/2012   Procedure: BIOPSY;  Surgeon: West Bali, MD;  [facility];  Service: Endoscopy;  Laterality: N/A;  DUODENAL BIOPSY   BIOPSY  07/07/2021   Procedure: BIOPSY;  Surgeon: Lanelle Bal, DO;  [facility];  Service: Endoscopy;;   BIOPSY  10/20/2021   Procedure: BIOPSY;  Surgeon: Lanelle Bal, DO;  [facility];  Service: Endoscopy;;   BLADDER SURGERY     CARPAL TUNNEL RELEASE Bilateral    CARPOMETACARPEL SUSPENSION PLASTY Right 06/30/2022   Procedure: RIGHT THUMB TRAPEZIECTOMY AND SUSPENSIONPLASTY;  Surgeon: Betha Loa, MD;  Location: [facility];  Service: Orthopedics;  Laterality: Right;  90 MIN AUX BLOCK   CHOLECYSTECTOMY     COLONOSCOPY WITH ESOPHAGOGASTRODUODENOSCOPY (EGD) N/A 10/28/2012   GEX:[id number] diverticulosis in the descending colon and sigmoid colon/Small internal hemorrhoids   COLONOSCOPY WITH PROPOFOL Left 10/20/2021   Procedure: COLONOSCOPY WITH PROPOFOL;  Surgeon: Lanelle Bal, DO;  [facility];  Service: Endoscopy;  Laterality: Left;  7:30am   ESOPHAGOGASTRODUODENOSCOPY N/A 11/27/2016  Procedure: ESOPHAGOGASTRODUODENOSCOPY (EGD);  Surgeon: West Bali, MD;  [facility];  Service: Endoscopy;  Laterality: N/A;  830    ESOPHAGOGASTRODUODENOSCOPY (EGD) WITH ESOPHAGEAL DILATION N/A 10/28/2012   [id number] CERVIAL WEB, S/P DILATION/MILD Non-erosive gastritis & DUODENITIS   ESOPHAGOGASTRODUODENOSCOPY (EGD) WITH PROPOFOL N/A 07/07/2021   gastritis s/p biopsy, normal duodenum. dilation.  Negative H.pylori.   POLYPECTOMY  10/20/2021   Procedure: POLYPECTOMY;  Surgeon: Lanelle Bal, DO;  [facility];  Service: Endoscopy;;   SAVORY DILATION N/A 11/27/2016   Procedure: SAVORY  DILATION;  Surgeon: West Bali, MD;  [facility];  Service: Endoscopy;  Laterality: N/A;   TOTAL KNEE ARTHROPLASTY Right     Allergies  Allergen Reactions   Trazodone Other (See Comments)   Codeine Other (See Comments)    Constipation   Dicyclomine     TURNED HER SKIN BLUE   Ibuprofen Other (See Comments)    Ulcers    Sulfa Antibiotics     Yeast infections    Valium [Diazepam] Other (See Comments)    Alters mental status    Review of Systems  Neurological:  Positive for tingling.       Tingling, burning, numbness in lower legs and feet (symmetrical, equal in severity).  Not noted in hands for forearms.      Physical Exam:  General: The patient is alert and oriented x3 in no acute distress.  Dermatology: Skin is warm, dry and supple bilateral lower extremities. Interspaces are clear of maceration and debris.    Vascular: Palpable pedal pulses bilaterally. Capillary refill within normal limits.  No appreciable edema.  No erythema or calor.  Neurological: Light touch sensation absent absent bilateral.  Protective sensation is diminished in the toes and forefoot bilateral.  Vibratory sensation is absent bilateral foot.  Musculoskeletal Exam: No pedal deformities noted  Assessment/Plan of Care: 1. Diabetic polyneuropathy associated with type 2 diabetes mellitus (HCC)     Meds ordered this encounter  Medications   gabapentin (NEURONTIN) 100 MG capsule    Sig: Take 1 capsule (100 mg total) by mouth 2 (two) times daily. Take one capsule by mouth in AM, and 3 capsules by mouth at bedtime as discussed at your visit.    Dispense:  120 capsule    Refill:  3    Discussed clinical findings with patient today. We discussed other options dosing and frequency, and different medications altogether, to try and improve her symptoms to make her more comfortable.  She was informed that the neuropathy is irreversible and that there is no cure.  Her A1c level is  well-maintained.  She asked that we take over the treatment of her neuropathy management.  She requested after our discussion to have her gabapentin dose adjusted.  Will have the patient take 100 mg in the morning, but will have her take 300 mg at bedtime.  Hopefully this will improve her ability to sleep and have a restful sleep so that it does not affect her activities of daily living the next day.    I will have her follow-up in approximately 1 month for recheck and either additional adjustment of medication or renewal of current dosing regimen, or attempt a new medication such as amitriptyline.   Clerance Lav, DPM, FACFAS [facility]     [address]  [place], [place] [address]                Office [phone]  Fax [phone]

## 2022-11-09 ENCOUNTER — Other Ambulatory Visit: Payer: Self-pay | Admitting: Internal Medicine

## 2022-11-09 NOTE — Progress Notes (Signed)
History of Present Illness: Chloe Golden is a 77 y.o. year old female here for f/u of refractory LUTS w/ UUI.  For summary of past GU Hx please see Dr Laverle Hobby note from July 2023.  She has recently had Botox instillation/injection in her bladder.  This has helped her symptoms immensely.  She wants to establish/keep her [place] relationship for treating symptomatic UTIs when they occur.  She has recently been on both sulfa and Keflex for what she feels like is an infection.  She did have side effects-GI-from the sulfa.  Currently has a little bit worse and urgency as well as dysuria.  No gross hematuria.  Past Medical History:  Diagnosis Date   Acid reflux    Arthritis    Degenerative arthritis of cervical spine    Diabetes mellitus without complication (HCC)    Fibromyalgia    Functional gastrointestinal disorder    GERD (gastroesophageal reflux disease)    Glaucoma    Hiatal hernia    Hypertension    Neuropathy    OAB (overactive bladder)    OSA on CPAP    stopped 6mos ago   PAD (peripheral artery disease) (HCC)    Mild by arterial Dopplers April 2019   Rheumatoid arthritis(714.0)    Venous reflux     Past Surgical History:  Procedure Laterality Date   ABDOMINAL HYSTERECTOMY     BACTERIAL OVERGROWTH TEST N/A 11/09/2012   Procedure: BACTERIAL OVERGROWTH TEST;  Surgeon: West Bali, MD;  Location: [facility];  Service: Endoscopy;  Laterality: N/A;  7:30   BALLOON DILATION N/A 07/07/2021   Procedure: BALLOON DILATION;  Surgeon: Lanelle Bal, DO;  [facility]: [facility];  Service: Endoscopy;  Laterality: N/A;   BIOPSY N/A 10/28/2012   Procedure: BIOPSY;  Surgeon: West Bali, MD;  [facility]: [facility];  Service: Endoscopy;  Laterality: N/A;  DUODENAL BIOPSY   BIOPSY  07/07/2021   Procedure: BIOPSY;  Surgeon: Lanelle Bal, DO;  [facility]: [facility];  Service: Endoscopy;;   BIOPSY  10/20/2021   Procedure: BIOPSY;  Surgeon: Lanelle Bal,  DO;  [facility]: [facility];  Service: Endoscopy;;   BLADDER SURGERY     CARPAL TUNNEL RELEASE Bilateral    CARPOMETACARPEL SUSPENSION PLASTY Right 06/30/2022   Procedure: RIGHT THUMB TRAPEZIECTOMY AND SUSPENSIONPLASTY;  Surgeon: Betha Loa, MD;  Location: [facility];  Service: Orthopedics;  Laterality: Right;  90 MIN AUX BLOCK   CHOLECYSTECTOMY     COLONOSCOPY WITH ESOPHAGOGASTRODUODENOSCOPY (EGD) N/A 10/28/2012   ZOX:[id number] diverticulosis in the descending colon and sigmoid colon/Small internal hemorrhoids   COLONOSCOPY WITH PROPOFOL Left 10/20/2021   Procedure: COLONOSCOPY WITH PROPOFOL;  Surgeon: Lanelle Bal, DO;  [facility]: [facility];  Service: Endoscopy;  Laterality: Left;  7:30am   ESOPHAGOGASTRODUODENOSCOPY N/A 11/27/2016   Procedure: ESOPHAGOGASTRODUODENOSCOPY (EGD);  Surgeon: West Bali, MD;  [facility];  Service: Endoscopy;  Laterality: N/A;  830    ESOPHAGOGASTRODUODENOSCOPY (EGD) WITH ESOPHAGEAL DILATION N/A 10/28/2012   JWJ:[id number] CERVIAL WEB, S/P DILATION/MILD Non-erosive gastritis & DUODENITIS   ESOPHAGOGASTRODUODENOSCOPY (EGD) WITH PROPOFOL N/A 07/07/2021   gastritis s/p biopsy, normal duodenum. dilation.  Negative H.pylori.   POLYPECTOMY  10/20/2021   Procedure: POLYPECTOMY;  Surgeon: Lanelle Bal, DO;  [facility];  Service: Endoscopy;;   SAVORY DILATION N/A 11/27/2016   Procedure: SAVORY DILATION;  Surgeon: West Bali, MD;  [facility]: [facility];  Service: Endoscopy;  Laterality:  N/A;   TOTAL KNEE ARTHROPLASTY Right     Home Medications:  (Not in a hospital admission)   Allergies:  Allergies  Allergen Reactions   Trazodone Other (See Comments)   Codeine Other (See Comments)    Constipation   Dicyclomine     TURNED HER SKIN BLUE   Ibuprofen Other (See Comments)    Ulcers    Sulfa Antibiotics     Yeast infections    Valium [Diazepam] Other (See Comments)    Alters mental status     Family History  Problem Relation Age of Onset   Lung cancer Father    Diabetes Mellitus II Father    Crohn's disease Cousin    Hypertension Mother    Cystic fibrosis Mother    Lung cancer Brother    Hypertension Brother    Lupus Niece    Colon cancer Neg Hx    Celiac disease Neg Hx    Gastric cancer Neg Hx    Esophageal cancer Neg Hx     Social History:  reports that she has never smoked. She has been exposed to tobacco smoke. She has never used smokeless tobacco. She reports that she does not drink alcohol and does not use drugs.  ROS: A complete review of systems was performed.  All systems are negative except for pertinent findings as noted.  Physical Exam:  Vital signs in last 24 hours: @VSRANGES @ General:  Alert and oriented, No acute distress HEENT: Normocephalic, atraumatic Neck: No JVD or lymphadenopathy Cardiovascular: Regular rate  Lungs: Normal inspiratory/expiratory excursion Extremities: No edema Neurologic: Grossly intact  I have reviewed prior pt notes  I have reviewed notes from referring/previous physicians-[facility]  I have reviewed urinalysis results-clear today  I have independently reviewed prior imaging  I have reviewed prior urine culture   Impression/Assessment:  OAB, relatively refractory but doing better after Botox treatment  Dysuria, frequency-it does not look like she has an infection  Plan:  I have recommended she take probiotics and AZO for now  Urine culture sent, we will call with results  Office visit as needed  Chelsea Aus 11/09/2022, 8:01 PM  Bertram Millard. Roselee Tayloe MD

## 2022-11-10 ENCOUNTER — Encounter: Payer: Self-pay | Admitting: Urology

## 2022-11-10 ENCOUNTER — Ambulatory Visit (INDEPENDENT_AMBULATORY_CARE_PROVIDER_SITE_OTHER): Payer: 59 | Admitting: Urology

## 2022-11-10 VITALS — BP 119/71 | HR 104

## 2022-11-10 DIAGNOSIS — N3941 Urge incontinence: Secondary | ICD-10-CM

## 2022-11-10 DIAGNOSIS — N3281 Overactive bladder: Secondary | ICD-10-CM

## 2022-11-10 DIAGNOSIS — R300 Dysuria: Secondary | ICD-10-CM

## 2022-11-10 DIAGNOSIS — R159 Full incontinence of feces: Secondary | ICD-10-CM

## 2022-11-10 DIAGNOSIS — Z87440 Personal history of urinary (tract) infections: Secondary | ICD-10-CM | POA: Diagnosis not present

## 2022-11-10 LAB — BLADDER SCAN AMB NON-IMAGING: Scan Result: 62

## 2022-11-11 ENCOUNTER — Telehealth: Payer: Self-pay

## 2022-11-11 LAB — URINALYSIS, ROUTINE W REFLEX MICROSCOPIC
Bilirubin, UA: NEGATIVE
Ketones, UA: NEGATIVE
Nitrite, UA: NEGATIVE
Protein,UA: NEGATIVE
RBC, UA: NEGATIVE
Specific Gravity, UA: 1.015 (ref 1.005–1.030)
Urobilinogen, Ur: 0.2 mg/dL (ref 0.2–1.0)
pH, UA: 6 (ref 5.0–7.5)

## 2022-11-11 LAB — MICROSCOPIC EXAMINATION: Bacteria, UA: NONE SEEN

## 2022-11-11 NOTE — Telephone Encounter (Signed)
Received records from ENT for pt - no referral, just records. Looks like patient has Chloe Golden so giving this to Dr. Vickey Huger to review.

## 2022-11-19 ENCOUNTER — Telehealth: Payer: Self-pay

## 2022-11-19 NOTE — Telephone Encounter (Signed)
Patient left a voice message.  Calling to be given the results from UTI testing.  Please advise.  Call back:  [phone]

## 2022-11-19 NOTE — Telephone Encounter (Signed)
Return call to patient. Patient states that she is still having burning and pressure.  Patient is made aware that the urine culture orders were put in after lab tech left for the day. Patient is aware to do a urine drop off for sxs of burning and pressure. Patient voiced understanding.

## 2022-11-19 NOTE — Telephone Encounter (Signed)
Her urine culture is still showing active. Can you check with Misty Stanley to make sure it was collected.

## 2022-11-20 ENCOUNTER — Ambulatory Visit: Payer: 59

## 2022-11-20 ENCOUNTER — Telehealth: Payer: Self-pay

## 2022-11-20 DIAGNOSIS — R300 Dysuria: Secondary | ICD-10-CM | POA: Diagnosis not present

## 2022-11-20 LAB — MICROSCOPIC EXAMINATION

## 2022-11-20 LAB — URINALYSIS, ROUTINE W REFLEX MICROSCOPIC
Bilirubin, UA: NEGATIVE
Ketones, UA: NEGATIVE
Nitrite, UA: NEGATIVE
Protein,UA: NEGATIVE
RBC, UA: NEGATIVE
Specific Gravity, UA: <1.005 — ABNORMAL LOW (ref 1.005–1.030)
Urobilinogen, Ur: 0.2 mg/dL (ref 0.2–1.0)
pH, UA: 6 (ref 5.0–7.5)

## 2022-11-20 MED ORDER — NITROFURANTOIN MONOHYD MACRO 100 MG PO CAPS: 100.0000 mg | ORAL_CAPSULE | Freq: Two times a day (BID) | ORAL | 0 refills | Status: AC

## 2022-11-20 NOTE — Telephone Encounter (Signed)
Patient left urine today and spoke to Dr. Ronne Binning about her urinalysis, he wanted patient to start Nitrofurantoin 100 mg bid for 7 days. I called patient  to let her know that I sent it in to her current pharmacy, in [place]. I advised her that if she still is having the burning, painful, frequent urination to please give [place] a call for further evaluation.

## 2022-11-23 DIAGNOSIS — R61 Generalized hyperhidrosis: Secondary | ICD-10-CM | POA: Diagnosis not present

## 2022-11-24 LAB — URINE CULTURE

## 2022-11-30 ENCOUNTER — Ambulatory Visit: Payer: 59 | Admitting: Podiatry

## 2022-12-03 DIAGNOSIS — I872 Venous insufficiency (chronic) (peripheral): Secondary | ICD-10-CM | POA: Insufficient documentation

## 2022-12-03 DIAGNOSIS — Z960 Presence of urogenital implants: Secondary | ICD-10-CM | POA: Insufficient documentation

## 2022-12-03 DIAGNOSIS — Z9181 History of falling: Secondary | ICD-10-CM | POA: Insufficient documentation

## 2022-12-03 DIAGNOSIS — R3915 Urgency of urination: Secondary | ICD-10-CM | POA: Insufficient documentation

## 2022-12-03 DIAGNOSIS — E559 Vitamin D deficiency, unspecified: Secondary | ICD-10-CM | POA: Insufficient documentation

## 2022-12-03 DIAGNOSIS — I998 Other disorder of circulatory system: Secondary | ICD-10-CM | POA: Insufficient documentation

## 2022-12-03 DIAGNOSIS — G4761 Periodic limb movement disorder: Secondary | ICD-10-CM | POA: Insufficient documentation

## 2022-12-03 DIAGNOSIS — I739 Peripheral vascular disease, unspecified: Secondary | ICD-10-CM | POA: Insufficient documentation

## 2022-12-03 DIAGNOSIS — G2581 Restless legs syndrome: Secondary | ICD-10-CM | POA: Insufficient documentation

## 2022-12-03 DIAGNOSIS — R351 Nocturia: Secondary | ICD-10-CM | POA: Insufficient documentation

## 2022-12-03 DIAGNOSIS — F4323 Adjustment disorder with mixed anxiety and depressed mood: Secondary | ICD-10-CM | POA: Insufficient documentation

## 2022-12-03 DIAGNOSIS — R350 Frequency of micturition: Secondary | ICD-10-CM | POA: Insufficient documentation

## 2022-12-03 NOTE — Progress Notes (Signed)
History of Present Illness: Chloe Golden is a 77 y.o. female who presents today for follow up visit at [facility]. - GU / Urogyn history: 1. OAB with urinary frequency, nocturia, urgency, and urge incontinence.  - Underwent InterStim implantation in January 2020.  She initially had good results with the InterStim.  She apparently had a return of her urinary symptoms and was placed on Myrbetriq.  Her InterStim was ultimately removed in May 2021 due to persistence of her urinary symptoms.   - Last bladder Botox was 04/17/2022 (per admin staff, who spoke to [facility]); patient found that to be helpful.  - Previously tried [employer]. - Currently taking Gemtesa 75 mg daily. She takes Vesicare PRN.  2. Stress urinary incontinence (resolved). TVT sling implanted in November 2013.  3. Prior history of recurrent UTIs.  - Normal cystoscopic evaluation by Dr. Pete Glatter on 07/30/2021. - She is at increased risk for UTI due to glucosuria secondary to Jardiance use, fecal incontinence, vaginal atrophy. - Previously treated with PRN antibiotics.  4. Stage 2 anterior vaginal prolapse (cystocele). As per Dr. Pete Glatter on 07/30/2021. 5. Vaginal atrophy. Uses topical vaginal estrogen cream (Premarin) 2x/week. 6. Fecal incontinence.    Urodynamic testing on 08/25/2021: "small capacity bladder with max capacity of 172 mL and increased sensation.  No bladder instability was seen.  No stress incontinence noted.  Voiding phase showed a peak pressure of 16 cm of water with complete emptying.  Her flow was intermittent and she did have increased EMG activity during voiding."  Urine culture results in past 12 months: - 12/30/2021: Negative - 11/20/2022: Positive for >100k E. Coli; treated with Macrobid - No additional urine culture results in past 12 months found per chart review.  At last visit with Dr. Retta Diones on 11/10/2022: PVR = 62 ml  Since last visit: - 11/20/2022: Urine culture  positive for >100k E. Coli; treated with Macrobid by Dr. Retta Diones  Today: She reports lower abdominal pressure, dysuria, increased urinary urgency, frequency.  She denies gross hematuria, straining to void, or sensations of incomplete emptying. She denies acute flank or abdominal pain. She denies fevers.   Denies wearing CPAP for her OSA.  Reports ongoing fecal incontinence. Followed by GI. States she takes probiotics. Denies fiber supplementation. Wears diapers.    Fall Screening: Do you usually have a device to assist in your mobility? No    Medications: Current Outpatient Medications  Medication Sig Dispense Refill   ACCU-CHEK GUIDE test strip See admin instructions.     ALPRAZolam (XANAX) 0.5 MG tablet Take 0.5-1 mg by mouth See admin instructions. 0.5 mg in the morning, 1 mg at bedtime     amLODipine (NORVASC) 5 MG tablet Take 5 mg by mouth daily.     amoxicillin-clavulanate (AUGMENTIN) 875-125 MG tablet Take 1 tablet by mouth every 12 (twelve) hours. 14 tablet 0   baclofen (LIORESAL) 10 MG tablet Take 10 mg by mouth 2 (two) times daily.     cetirizine (ZYRTEC) 10 MG tablet Take 10 mg by mouth daily.     Coenzyme Q10 (COQ-10) 100 MG CAPS Take 100 mg by mouth daily at 6 (six) AM.     conjugated estrogens (PREMARIN) vaginal cream Place 1 applicator vaginally See admin instructions. Twice a week as directed     Daridorexant HCl (QUVIVIQ) 50 MG TABS Take by mouth.     estrogens, conjugated, (PREMARIN) 0.625 MG tablet Take 0.625 mg by mouth daily.     ezetimibe (  ZETIA) 10 MG tablet Take 10 mg by mouth at bedtime.      fluticasone (FLONASE) 50 MCG/ACT nasal spray Place into both nostrils daily.     furosemide (LASIX) 20 MG tablet Take 20 mg by mouth daily.     gabapentin (NEURONTIN) 100 MG capsule Take 1 capsule (100 mg total) by mouth 2 (two) times daily. Take one capsule by mouth in AM, and 3 capsules by mouth at bedtime as discussed at your visit. 120 capsule 3   GEMTESA 75 MG  TABS Take 75 mg by mouth daily.     HYDROcodone-acetaminophen (NORCO) 5-325 MG tablet 1-2 tabs po q6 hours prn pain 25 tablet 0   hydroxychloroquine (PLAQUENIL) 200 MG tablet Take 400 mg by mouth daily.     JANUVIA 100 MG tablet Take 100 mg by mouth daily.     JARDIANCE 10 MG TABS tablet Take 10 mg by mouth daily.     leflunomide (ARAVA) 10 MG tablet Take 10 mg by mouth daily.     loratadine (CLARITIN) 10 MG tablet Take 10 mg by mouth daily.     losartan-hydrochlorothiazide (HYZAAR) 100-12.5 MG tablet Take 1 tablet by mouth daily.     lubiprostone (AMITIZA) 8 MCG capsule TAKE 1 CAPSULE (8 MCG TOTAL) BY MOUTH 2 (TWO) TIMES DAILY WITH A MEAL. 60 capsule 3   magnesium gluconate (MAGONATE) 500 MG tablet Take 500 mg by mouth 2 (two) times daily.     Multiple Vitamins-Minerals (MULTIVITAMIN WITH MINERALS) tablet Take 1 tablet by mouth daily.     nebivolol (BYSTOLIC) 5 MG tablet Take 5 mg by mouth at bedtime.     Omega-3 Fatty Acids (FISH OIL) 1000 MG CAPS Take 1,000 mg by mouth daily.     ondansetron (ZOFRAN) 4 MG tablet Take 1 tablet (4 mg total) by mouth every 6 (six) hours. (Patient taking differently: Take 4 mg by mouth every 6 (six) hours as needed for nausea or vomiting.) 12 tablet 0   potassium chloride (KLOR-CON) 10 MEQ tablet Take 10 mEq by mouth daily.     pregabalin (LYRICA) 50 MG capsule Take 50 mg by mouth 2 (two) times daily.     propranolol (INDERAL) 10 MG tablet Take 10 mg by mouth daily.     RABEprazole (ACIPHEX) 20 MG tablet TAKE 1 TABLET (20 MG TOTAL) BY MOUTH IN THE MORNING AND AT BEDTIME. 60 tablet 5   rOPINIRole (REQUIP) 0.5 MG tablet Take 1.5 mg by mouth at bedtime.     simvastatin (ZOCOR) 20 MG tablet Take 20 mg by mouth every Monday, Wednesday, and Friday.     traZODone (DESYREL) 50 MG tablet Take 50 mg by mouth at bedtime.     TURMERIC CURCUMIN PO Take 1,000 mg by mouth daily.     vortioxetine HBr (TRINTELLIX) 10 MG TABS tablet Take 10 mg by mouth daily.     vortioxetine  HBr (TRINTELLIX) 10 MG TABS tablet Take 1 tablet by mouth daily.     No current facility-administered medications for this visit.    Allergies: Allergies  Allergen Reactions   Trazodone Other (See Comments)   Codeine Other (See Comments)    Constipation   Dicyclomine     TURNED HER SKIN BLUE   Ibuprofen Other (See Comments)    Ulcers    Keflex [Cephalexin] Nausea Only   Sulfa Antibiotics     Yeast infections    Valium [Diazepam] Other (See Comments)    Alters mental status  Past Medical History:  Diagnosis Date   Acid reflux    Arthritis    Degenerative arthritis of cervical spine    Diabetes mellitus without complication (HCC)    Fibromyalgia    Functional gastrointestinal disorder    GERD (gastroesophageal reflux disease)    Glaucoma    Hiatal hernia    Hypertension    Neuropathy    OAB (overactive bladder)    OSA on CPAP    stopped 6mos ago   PAD (peripheral artery disease) (HCC)    Mild by arterial Dopplers April 2019   Rheumatoid arthritis(714.0)    Venous reflux    Past Surgical History:  Procedure Laterality Date   ABDOMINAL HYSTERECTOMY     BACTERIAL OVERGROWTH TEST N/A 11/09/2012   Procedure: BACTERIAL OVERGROWTH TEST;  Surgeon: West Bali, MD;  [facility]: [facility];  Service: Endoscopy;  Laterality: N/A;  7:30   BALLOON DILATION N/A 07/07/2021   Procedure: BALLOON DILATION;  Surgeon: Lanelle Bal, DO;  [facility]: [facility];  Service: Endoscopy;  Laterality: N/A;   BIOPSY N/A 10/28/2012   Procedure: BIOPSY;  Surgeon: West Bali, MD;  [facility]: [facility];  Service: Endoscopy;  Laterality: N/A;  DUODENAL BIOPSY   BIOPSY  07/07/2021   Procedure: BIOPSY;  Surgeon: Lanelle Bal, DO;  Location: [facility];  Service: Endoscopy;;   BIOPSY  10/20/2021   Procedure: BIOPSY;  Surgeon: Lanelle Bal, DO;  [facility];  Service: Endoscopy;;   BLADDER SURGERY     CARPAL TUNNEL RELEASE Bilateral    CARPOMETACARPEL  SUSPENSION PLASTY Right 06/30/2022   Procedure: RIGHT THUMB TRAPEZIECTOMY AND SUSPENSIONPLASTY;  Surgeon: Betha Loa, MD;  Location: [facility];  Service: Orthopedics;  Laterality: Right;  90 MIN AUX BLOCK   CHOLECYSTECTOMY     COLONOSCOPY WITH ESOPHAGOGASTRODUODENOSCOPY (EGD) N/A 10/28/2012   GNF:AOZHYQMV diverticulosis in the descending colon and sigmoid colon/Small internal hemorrhoids   COLONOSCOPY WITH PROPOFOL Left 10/20/2021   Procedure: COLONOSCOPY WITH PROPOFOL;  Surgeon: Lanelle Bal, DO;  [facility];  Service: Endoscopy;  Laterality: Left;  7:30am   ESOPHAGOGASTRODUODENOSCOPY N/A 11/27/2016   Procedure: ESOPHAGOGASTRODUODENOSCOPY (EGD);  Surgeon: West Bali, MD;  [facility]: [facility];  Service: Endoscopy;  Laterality: N/A;  830    ESOPHAGOGASTRODUODENOSCOPY (EGD) WITH ESOPHAGEAL DILATION N/A 10/28/2012   HQI:ONGEXBMW CERVIAL WEB, S/P DILATION/MILD Non-erosive gastritis & DUODENITIS   ESOPHAGOGASTRODUODENOSCOPY (EGD) WITH PROPOFOL N/A 07/07/2021   gastritis s/p biopsy, normal duodenum. dilation.  Negative H.pylori.   POLYPECTOMY  10/20/2021   Procedure: POLYPECTOMY;  Surgeon: Lanelle Bal, DO;  [facility];  Service: Endoscopy;;   SAVORY DILATION N/A 11/27/2016   Procedure: SAVORY DILATION;  Surgeon: West Bali, MD;  [facility];  Service: Endoscopy;  Laterality: N/A;   TOTAL KNEE ARTHROPLASTY Right    Family History  Problem Relation Age of Onset   Lung cancer Father    Diabetes Mellitus II Father    Crohn's disease Cousin    Hypertension Mother    Cystic fibrosis Mother    Lung cancer Brother    Hypertension Brother    Lupus Niece    Colon cancer Neg Hx    Celiac disease Neg Hx    Gastric cancer Neg Hx    Esophageal cancer Neg Hx    Social History   Socioeconomic History   Marital status: Single    Spouse name: Not on file  Number of children: Not on file   Years of education: Not on file    Highest education level: Not on file  Occupational History   Not on file  Tobacco Use   Smoking status: Never    Passive exposure: Past   Smokeless tobacco: Never  Vaping Use   Vaping Use: Never used  Substance and Sexual Activity   Alcohol use: No   Drug use: No   Sexual activity: Yes    Birth control/protection: Surgical    Comment: hyst  Other Topics Concern   Not on file  Social History Narrative   1 living   2 deceased   Social Determinants of Health   Financial Resource Strain: Not on file  Food Insecurity: Not on file  Transportation Needs: Not on file  Physical Activity: Not on file  Stress: Not on file  Social Connections: Not on file  Intimate Partner Violence: Not on file    Review of Systems Constitutional: Patient denies any unintentional weight loss or change in strength lntegumentary: Patient denies any rashes or pruritus Eyes: Patient denies dry eyes ENT: Patient denies dry mouth Cardiovascular: Patient denies chest pain or syncope Respiratory: Patient denies shortness of breath Gastrointestinal: As per HPI Musculoskeletal: Patient denies muscle cramps or weakness Neurologic: Patient denies convulsions or seizures Allergic/Immunologic: Patient denies recent allergic reaction(s) Hematologic/Lymphatic: Patient denies bleeding tendencies Endocrine: Patient denies heat/cold intolerance  GU: As per HPI.  OBJECTIVE Vitals:   12/04/22 1210  BP: 106/70  Pulse: (!) 101  Temp: 98.5 F (36.9 C)   There is no height or weight on file to calculate BMI.  Physical Examination  Constitutional: No obvious distress; patient is non-toxic appearing  Cardiovascular: No visible lower extremity edema.  Respiratory: The patient does not have audible wheezing/stridor; respirations do not appear labored  Gastrointestinal: Abdomen non-distended Musculoskeletal: Normal ROM of UEs  Skin: No obvious rashes/open sores  Neurologic: CN 2-12 grossly  intact Psychiatric: Answered questions appropriately with normal affect  Hematologic/Lymphatic/Immunologic: No obvious bruises or sites of spontaneous bleeding  UA: positive for 6-10 WBC/hpf, 0-2 RBC/hpf, bacteria (many) PVR: 9 ml  ASSESSMENT History of UTI - Plan: Urinalysis, Routine w reflex microscopic, BLADDER SCAN AMB NON-IMAGING, amoxicillin-clavulanate (AUGMENTIN) 875-125 MG tablet  OAB (overactive bladder) - Plan: Urinalysis, Routine w reflex microscopic, BLADDER SCAN AMB NON-IMAGING  Urge incontinence - Plan: Urinalysis, Routine w reflex microscopic, BLADDER SCAN AMB NON-IMAGING  Urinary urgency - Plan: Urinalysis, Routine w reflex microscopic, BLADDER SCAN AMB NON-IMAGING  Nocturia - Plan: Urinalysis, Routine w reflex microscopic, BLADDER SCAN AMB NON-IMAGING  Urinary frequency - Plan: Urinalysis, Routine w reflex microscopic, BLADDER SCAN AMB NON-IMAGING  History of recurrent UTI (urinary tract infection) - Plan: Urinalysis, Routine w reflex microscopic, BLADDER SCAN AMB NON-IMAGING, amoxicillin-clavulanate (AUGMENTIN) 875-125 MG tablet  Suburethral sling present - Plan: Urinalysis, Routine w reflex microscopic, BLADDER SCAN AMB NON-IMAGING  Incontinence of feces, unspecified fecal incontinence type - Plan: Urinalysis, Routine w reflex microscopic, BLADDER SCAN AMB NON-IMAGING  Dementia, unspecified dementia severity, unspecified dementia type, unspecified whether behavioral, psychotic, or mood disturbance or anxiety (HCC) - Plan: Urinalysis, Routine w reflex microscopic, BLADDER SCAN AMB NON-IMAGING  Neuropathy - Plan: Urinalysis, Routine w reflex microscopic, BLADDER SCAN AMB NON-IMAGING  Obstructive sleep apnea syndrome - Plan: Urinalysis, Routine w reflex microscopic, BLADDER SCAN AMB NON-IMAGING  Glucose found in urine on examination  UTI symptoms - Plan: amoxicillin-clavulanate (AUGMENTIN) 875-125 MG tablet  1. Acute UTI symptoms. Will check urine culture. Will  treat  empirically with Augmentin while awaiting culture results and sensitivities.  2. Prior history of recurrent UTIs.  We discussed the possible etiologies of recurrent UTls including ascending infection related to intercourse; vaginal atrophy; transmural infection that has been treated incompletely; urinary tract stones; incomplete bladder emptying with urinary stasis; kidney or bladder tumor; urethral diverticulum; and colonization of  vagina and urinary tract with pathologic, adherent organisms.   For UTI prevention we discussed options including: Adequate fluid intake (>1.5 liters/day) to flush out the urinary tract. - Go to the bathroom to urinate every 4-6 hours while awake to minimize urinary stasis / bacterial overgrowth in the bladder. - Proanthocyanidin (PAC) supplement 36 mg daily; must be soluble (insoluble form of PAC will be ineffective). Recommend Ellura. Vitamin C supplement Probiotic to maintain healthy vaginal microbiome - Topical vaginal estrogen for vaginal atrophy.  Advised pt not to use D-mannose for UTI prevention due to sugar content given patient's history of diabetes.  For management of acute UTI symptoms: - Patient was advised that if/when UTI-like symptoms occur they can call our office to speak with a nurse, who can place an order for urinalysis (with reflex to urine culture). They may then proceed to a [facility] to provide their urine sample. This is required for evaluation in order for their Urology provider to make informed treatment recommendation(s), which may or may not include an antibiotic prescription. - Discussed option to take over-the-counter Pyridium (commonly known under the AZO brand name) for bladder pain. No more than 3 days at a time.  3. Vaginal atrophy. Advised to continue topical vaginal estrogen cream (Premarin) 2x/week.  4. Fecal incontinence. Discussed this as a risk factor for UTIs. Advised fiber supplementation and ongoing  follow up with GI provider.  5. OAB with urinary frequency, nocturia, urgency, and urge incontinence.  - Patient requesting repeat bladder Botox. Will consult with Dr. Retta Diones or one of the other urology physicians to get that scheduled.  - OK to continue Gemtesa daily and Vesicare PRN.   Pt verbalized understanding and agreement. All questions were answered.  PLAN Advised the following: 1. Urine culture. 2. Take Augmentin for suspected UTI. 3. Continue topical vaginal estrogen cream (Premarin) 2x/week. 4. Maintain adequate hydration. 5. Start fiber supplementation daily. 6. Continue Gemtesa daily; may take Vesicare PRN.  7. Return in about 6 weeks (around 01/15/2023) for cystoscopy with Botox with Dr. Retta Diones for OAB.  Orders Placed This Encounter  Procedures   Urinalysis, Routine w reflex microscopic   BLADDER SCAN AMB NON-IMAGING    It has been explained that the patient is to follow regularly with their PCP in addition to all other providers involved in their care and to follow instructions provided by these respective offices. Patient advised to contact urology clinic if any urologic-pertaining questions, concerns, new symptoms or problems arise in the interim period.  Patient Instructions  Recommendations regarding UTI prevention / management:  When UTI symptoms occur: Call urology office to request order for urine culture. We recommend waiting for urine culture result prior to use of any antibiotics.  For bladder pain/ burning with urination: Over the counter Pyridium (phenazopyridine) as needed (commonly known under the "AZO" brand). No more than 3 days consecutively at a time due to risk for methemoglobinemia, liver function issues, and bone health damage with long term use of Pyridium.  Routine use for UTI prevention: - Topical vaginal estrogen for vaginal atrophy. Adequate fluid intake (>1.5 liters/day) to flush out the urinary tract. - Go to  the bathroom to  urinate every 4-6 hours while awake to minimize urinary stasis / bacterial overgrowth in the bladder. - Proanthocyanidin (PAC) supplement 36 mg daily; must be soluble (insoluble form of PAC will be ineffective). Recommended brand: Ellura. This is an over-the-counter supplement (often must be found/ purchased online) supplement derived from cranberries with concentrated active component: Proanthocyanidin (PAC) 36 mg daily. Decreases bacterial adherence to bladder lining. Not recommended for patients with interstitial cystitis due to acidity. - Vitamin C supplement to acidify urine to minimize bacterial growth. Not recommended for patients with interstitial cystitis due to acidity. - Probiotic to maintain healthy vaginal microbiome to suppress bacteria at urethral opening. Brand recommendations: Feminine Balance (highest concentration of lactobacillus) or Hyperbiotic Pro 15.  Note for patients with diabetes: You may read about D-mannose powder for UTI prevention. That is an over the-counter supplement which decreases bacterial adherence to bladder lining. I would NOT advise that for you as a person with diabetes due to its sugar content.       Comparison of Fiber Supplements The best way to consume 35 grams of fiber per day is through a healthy diet, but that may not always be possible. Fiber supplements are available without a prescription to help increase fiber consumption.  Comparison of powder fiber brands:   Metamucil Citrucel Benefiber Fibersure   Active ingredient Psyllium husk Methylcellulos e Wheat dextrin lnulin   Is active* natural? Natural Semi-synthetic Natural Natural   FDA approval for laxation? Yes Yes No No   Grams of active/day for laxation 2.5-30 grams 4-6 grams Not approved as laxative Not approved as laxative   Amount of active/dose 3.4 grams 2 grams 3 grams 5 grams   Required # of doses/day for laxation 1 2 Not approved as laxative Not approved as laxative   Soluble fiber/  Insoluble fiber? 70% soluble 100% soluble 100% soluble 100% soluble   Active holds water? Yes Yes No No    Active forms a gel? Yes No No No  Active bulks stools? Yes Yes No No  tive traps bile acids? Yes No No Inadequate data  Active is fermentable? Partially No Yes Yes  Helps lower blood cholesterol? Yes Minimally No No  Helps lower blood pressure? Yes Inadequate data Inadequate data Inadequate data  Helps lower blood sugar? Yes Inadequate Yes No data  Helps lower the risk of heart disease? Yes Inadequate data Inadequate data Inadequate data    Comparison of solid dose fiber brands:   Metamucil Citrucel FiberCon FiberChoice    Capsules Swallowable Swallowable      Caplets tablets    Active Psyllium husk Methylcellulose Calcium lnulin   ingredient   Polycarbophil    Is active* Natural Semi-synthetic Synthetic Natural   natural?       FDA approval Yes Yes Yes No   for laxation?       Grams of 2.5-30 grams 4-6 grams 4-6 grams 5 grams   active/day for       laxation       Amount of 0.525 grams 0.5 0.625 2 grams/tablet   active/dose /capsule= 5 grams/caplet = grams/caplet = 2 tablets/dose    capsules/dose 2 caplets/dose =2       caplets/dose    Required # of 1 4 4  n/a   doses/day for       laxation       Required # of 5 8 8  n/a   caplets/day for       laxation  Soluble fiber/ 70% soluble 100% soluble 100% soluble 100% soluble   Insoluble fiber?        Active holds water? Yes Yes Yes No  Active forms a gel? Yes No Yes No  Active bulks stools? Yes Yes Yes No  Active traps bile acids? Yes Yes Inadequate data Inadequate data  Active is fermentable? Partially No No Yes  Helps lower blood cholesterol? Yes Minimally No No    *Active refers to the active ingredient in the supplement. Resource: www.nationalfibercouncil.org   Electronically signed by:  Donnita Falls, FNP   12/04/22    1:21 PM

## 2022-12-04 ENCOUNTER — Telehealth: Payer: Self-pay

## 2022-12-04 ENCOUNTER — Ambulatory Visit (INDEPENDENT_AMBULATORY_CARE_PROVIDER_SITE_OTHER): Payer: 59 | Admitting: Urology

## 2022-12-04 ENCOUNTER — Encounter: Payer: Self-pay | Admitting: Urology

## 2022-12-04 VITALS — BP 106/70 | HR 101 | Temp 98.5°F

## 2022-12-04 DIAGNOSIS — R1030 Lower abdominal pain, unspecified: Secondary | ICD-10-CM

## 2022-12-04 DIAGNOSIS — F0390 Unspecified dementia without behavioral disturbance: Secondary | ICD-10-CM

## 2022-12-04 DIAGNOSIS — N3941 Urge incontinence: Secondary | ICD-10-CM | POA: Diagnosis not present

## 2022-12-04 DIAGNOSIS — R399 Unspecified symptoms and signs involving the genitourinary system: Secondary | ICD-10-CM

## 2022-12-04 DIAGNOSIS — N3281 Overactive bladder: Secondary | ICD-10-CM

## 2022-12-04 DIAGNOSIS — Z960 Presence of urogenital implants: Secondary | ICD-10-CM | POA: Diagnosis not present

## 2022-12-04 DIAGNOSIS — R3915 Urgency of urination: Secondary | ICD-10-CM

## 2022-12-04 DIAGNOSIS — R159 Full incontinence of feces: Secondary | ICD-10-CM

## 2022-12-04 DIAGNOSIS — Z87440 Personal history of urinary (tract) infections: Secondary | ICD-10-CM | POA: Diagnosis not present

## 2022-12-04 DIAGNOSIS — R81 Glycosuria: Secondary | ICD-10-CM | POA: Diagnosis not present

## 2022-12-04 DIAGNOSIS — R350 Frequency of micturition: Secondary | ICD-10-CM | POA: Diagnosis not present

## 2022-12-04 DIAGNOSIS — G4733 Obstructive sleep apnea (adult) (pediatric): Secondary | ICD-10-CM

## 2022-12-04 DIAGNOSIS — R351 Nocturia: Secondary | ICD-10-CM

## 2022-12-04 DIAGNOSIS — G629 Polyneuropathy, unspecified: Secondary | ICD-10-CM

## 2022-12-04 LAB — URINALYSIS, ROUTINE W REFLEX MICROSCOPIC
Bilirubin, UA: NEGATIVE
Ketones, UA: NEGATIVE
Nitrite, UA: NEGATIVE
Protein,UA: NEGATIVE
RBC, UA: NEGATIVE
Specific Gravity, UA: 1.02 (ref 1.005–1.030)
Urobilinogen, Ur: 0.2 mg/dL (ref 0.2–1.0)
pH, UA: 6.5 (ref 5.0–7.5)

## 2022-12-04 LAB — MICROSCOPIC EXAMINATION

## 2022-12-04 LAB — BLADDER SCAN AMB NON-IMAGING

## 2022-12-04 MED ORDER — AMOXICILLIN-POT CLAVULANATE 875-125 MG PO TABS: 1.0000 | ORAL_TABLET | Freq: Two times a day (BID) | ORAL | 0 refills | Status: DC

## 2022-12-04 NOTE — Patient Instructions (Signed)
Recommendations regarding UTI prevention / management:  When UTI symptoms occur: Call urology office to request order for urine culture. We recommend waiting for urine culture result prior to use of any antibiotics.  For bladder pain/ burning with urination: Over the counter Pyridium (phenazopyridine) as needed (commonly known under the "AZO" brand). No more than 3 days consecutively at a time due to risk for methemoglobinemia, liver function issues, and bone health damage with long term use of Pyridium.  Routine use for UTI prevention: - Topical vaginal estrogen for vaginal atrophy. Adequate fluid intake (>1.5 liters/day) to flush out the urinary tract. - Go to the bathroom to urinate every 4-6 hours while awake to minimize urinary stasis / bacterial overgrowth in the bladder. - Proanthocyanidin (PAC) supplement 36 mg daily; must be soluble (insoluble form of PAC will be ineffective). Recommended brand: Ellura. This is an over-the-counter supplement (often must be found/ purchased online) supplement derived from cranberries with concentrated active component: Proanthocyanidin (PAC) 36 mg daily. Decreases bacterial adherence to bladder lining. Not recommended for patients with interstitial cystitis due to acidity. - Vitamin C supplement to acidify urine to minimize bacterial growth. Not recommended for patients with interstitial cystitis due to acidity. - Probiotic to maintain healthy vaginal microbiome to suppress bacteria at urethral opening. Brand recommendations: Feminine Balance (highest concentration of lactobacillus) or Hyperbiotic Pro 15.  Note for patients with diabetes: You may read about D-mannose powder for UTI prevention. That is an over the-counter supplement which decreases bacterial adherence to bladder lining. I would NOT advise that for you as a person with diabetes due to its sugar content.       Comparison of Fiber Supplements The best way to consume 35 grams of fiber per  day is through a healthy diet, but that may not always be possible. Fiber supplements are available without a prescription to help increase fiber consumption.  Comparison of powder fiber brands:   Metamucil Citrucel Benefiber Fibersure   Active ingredient Psyllium husk Methylcellulos e Wheat dextrin lnulin   Is active* natural? Natural Semi-synthetic Natural Natural   FDA approval for laxation? Yes Yes No No   Grams of active/day for laxation 2.5-30 grams 4-6 grams Not approved as laxative Not approved as laxative   Amount of active/dose 3.4 grams 2 grams 3 grams 5 grams   Required # of doses/day for laxation 1 2 Not approved as laxative Not approved as laxative   Soluble fiber/ Insoluble fiber? 70% soluble 100% soluble 100% soluble 100% soluble   Active holds water? Yes Yes No No    Active forms a gel? Yes No No No  Active bulks stools? Yes Yes No No  tive traps bile acids? Yes No No Inadequate data  Active is fermentable? Partially No Yes Yes  Helps lower blood cholesterol? Yes Minimally No No  Helps lower blood pressure? Yes Inadequate data Inadequate data Inadequate data  Helps lower blood sugar? Yes Inadequate Yes No data  Helps lower the risk of heart disease? Yes Inadequate data Inadequate data Inadequate data    Comparison of solid dose fiber brands:   Metamucil Citrucel FiberCon FiberChoice    Capsules Swallowable Swallowable      Caplets tablets    Active Psyllium husk Methylcellulose Calcium lnulin   ingredient   Polycarbophil    Is active* Natural Semi-synthetic Synthetic Natural   natural?       FDA approval Yes Yes Yes No   for laxation?       Grams of  2.5-30 grams 4-6 grams 4-6 grams 5 grams   active/day for       laxation       Amount of 0.525 grams 0.5 0.625 2 grams/tablet   active/dose /capsule= 5 grams/caplet = grams/caplet = 2 tablets/dose    capsules/dose 2 caplets/dose =2       caplets/dose    Required # of 1 4 4  n/a   doses/day for        laxation       Required # of 5 8 8  n/a   caplets/day for       laxation       Soluble fiber/ 70% soluble 100% soluble 100% soluble 100% soluble   Insoluble fiber?        Active holds water? Yes Yes Yes No  Active forms a gel? Yes No Yes No  Active bulks stools? Yes Yes Yes No  Active traps bile acids? Yes Yes Inadequate data Inadequate data  Active is fermentable? Partially No No Yes  Helps lower blood cholesterol? Yes Minimally No No    *Active refers to the active ingredient in the supplement. Resource: www.nationalfibercouncil.org

## 2022-12-04 NOTE — Telephone Encounter (Signed)
Patient's last Botox was 04/17/2022. Her next appointment with you is in August. I wanted to know if the patient was ok to wait until then for Botox or if she would need to be double booked prior to July.   Thank you

## 2022-12-14 ENCOUNTER — Telehealth: Payer: Self-pay

## 2022-12-14 NOTE — Telephone Encounter (Signed)
-----   Message from Donnita Falls, FNP sent at 12/14/2022  8:30 AM EDT ----- Please let patient know that Dr. Retta Diones doesn't offer cysto with Botox so Dr. Ronne Binning will do that for her (they are taking over her care for Dr. Pete Glatter).  Please schedule that at around 01/15/2023. Thank you!        ----- Message ----- From: Malen Gauze, MD Sent: 12/11/2022  10:41 PM EDT To: Donnita Falls, FNP  yes ----- Message ----- From: Donnita Falls, FNP Sent: 12/08/2022   4:29 PM EDT To: Malen Gauze, MD  Are you able to offer cysto w/ Botox for this patient? She had it previously by Dr. Pete Glatter.    ----- Message ----- From: Marcine Matar, MD Sent: 12/08/2022   4:23 PM EDT To: Donnita Falls, FNP  OK--I dont do but see if Luisa Hart will ----- Message ----- From: Donnita Falls, FNP Sent: 12/04/2022   1:22 PM EDT To: Marcine Matar, MD  Patient would like to schedule cysto w/ Botox with you in the near future. Are you able to accommodate that in your remaining time frame? If not, okay to ask one of the other [facility] physicians?

## 2022-12-14 NOTE — Telephone Encounter (Signed)
Open in error

## 2022-12-16 ENCOUNTER — Emergency Department (HOSPITAL_COMMUNITY): Payer: 59

## 2022-12-16 ENCOUNTER — Other Ambulatory Visit: Payer: Self-pay

## 2022-12-16 ENCOUNTER — Encounter (HOSPITAL_COMMUNITY): Payer: Self-pay

## 2022-12-16 ENCOUNTER — Emergency Department (HOSPITAL_COMMUNITY)
Admission: EM | Admit: 2022-12-16 | Discharge: 2022-12-16 | Disposition: A | Discharge status: Home / Self Care | Payer: 59 | Attending: Emergency Medicine | Admitting: Emergency Medicine

## 2022-12-16 DIAGNOSIS — Z79899 Other long term (current) drug therapy: Secondary | ICD-10-CM | POA: Insufficient documentation

## 2022-12-16 DIAGNOSIS — Z7984 Long term (current) use of oral hypoglycemic drugs: Secondary | ICD-10-CM | POA: Insufficient documentation

## 2022-12-16 DIAGNOSIS — M25552 Pain in left hip: Secondary | ICD-10-CM | POA: Diagnosis not present

## 2022-12-16 DIAGNOSIS — M545 Low back pain, unspecified: Secondary | ICD-10-CM | POA: Diagnosis not present

## 2022-12-16 DIAGNOSIS — M79604 Pain in right leg: Secondary | ICD-10-CM | POA: Insufficient documentation

## 2022-12-16 DIAGNOSIS — I10 Essential (primary) hypertension: Secondary | ICD-10-CM | POA: Insufficient documentation

## 2022-12-16 DIAGNOSIS — Z043 Encounter for examination and observation following other accident: Secondary | ICD-10-CM | POA: Diagnosis not present

## 2022-12-16 DIAGNOSIS — M25571 Pain in right ankle and joints of right foot: Secondary | ICD-10-CM | POA: Insufficient documentation

## 2022-12-16 DIAGNOSIS — R519 Headache, unspecified: Secondary | ICD-10-CM | POA: Diagnosis not present

## 2022-12-16 DIAGNOSIS — S99811A Other specified injuries of right ankle, initial encounter: Secondary | ICD-10-CM | POA: Diagnosis not present

## 2022-12-16 DIAGNOSIS — M7731 Calcaneal spur, right foot: Secondary | ICD-10-CM | POA: Diagnosis not present

## 2022-12-16 DIAGNOSIS — I700 Atherosclerosis of aorta: Secondary | ICD-10-CM | POA: Diagnosis not present

## 2022-12-16 DIAGNOSIS — E1140 Type 2 diabetes mellitus with diabetic neuropathy, unspecified: Secondary | ICD-10-CM | POA: Insufficient documentation

## 2022-12-16 DIAGNOSIS — W010XXA Fall on same level from slipping, tripping and stumbling without subsequent striking against object, initial encounter: Secondary | ICD-10-CM | POA: Insufficient documentation

## 2022-12-16 DIAGNOSIS — M5136 Other intervertebral disc degeneration, lumbar region: Secondary | ICD-10-CM | POA: Diagnosis not present

## 2022-12-16 DIAGNOSIS — M160 Bilateral primary osteoarthritis of hip: Secondary | ICD-10-CM | POA: Diagnosis not present

## 2022-12-16 DIAGNOSIS — M85852 Other specified disorders of bone density and structure, left thigh: Secondary | ICD-10-CM | POA: Diagnosis not present

## 2022-12-16 DIAGNOSIS — M549 Dorsalgia, unspecified: Secondary | ICD-10-CM | POA: Diagnosis not present

## 2022-12-16 DIAGNOSIS — Z96651 Presence of right artificial knee joint: Secondary | ICD-10-CM | POA: Diagnosis not present

## 2022-12-16 DIAGNOSIS — R296 Repeated falls: Secondary | ICD-10-CM | POA: Diagnosis not present

## 2022-12-16 DIAGNOSIS — M4319 Spondylolisthesis, multiple sites in spine: Secondary | ICD-10-CM | POA: Diagnosis not present

## 2022-12-16 DIAGNOSIS — Z471 Aftercare following joint replacement surgery: Secondary | ICD-10-CM | POA: Diagnosis not present

## 2022-12-16 MED ORDER — ACETAMINOPHEN 325 MG PO TABS
650.0000 mg | ORAL_TABLET | Freq: Once | ORAL | Status: AC
  Administered 2022-12-16: 650 mg via ORAL
  Filled 2022-12-16: qty 2

## 2022-12-16 NOTE — Discharge Instructions (Addendum)
Use the ankle brace and a walker to help support your ankle while it heals and follow-up with orthopedics. Tylenol is over-the-counter medication as needed for discomfort, keep it elevated to help with swelling as needed.  Come back to the ER for any new or worsening symptoms.

## 2022-12-16 NOTE — ED Provider Notes (Signed)
Patient signed out to me pending imaging results.  Patient here for injury sustained in fall that occurred 4 days ago.  Describes mechanical fall.  Head imaging of the left hip, right tib-fib, L-spine, right ankle with CT of C-spine and head.  Most of her pain is within the right ankle.  See previous provider note for complete H&P.  CT imaging results pending at time of signout.  CT results reviewed by me, no evidence of acute intracranial process, CT C-spine also without fracture or traumatic listhesis  Discussed findings with the patient, given walker and ankle brace.  She is agreeable to Tylenol at home.  Will follow-up with orthopedics.  CT Cervical Spine Wo Contrast  Result Date: 12/16/2022 CLINICAL DATA:  Multiple falls EXAM: CT HEAD WITHOUT CONTRAST CT CERVICAL SPINE WITHOUT CONTRAST TECHNIQUE: Multidetector CT imaging of the head and cervical spine was performed following the standard protocol without intravenous contrast. Multiplanar CT image reconstructions of the cervical spine were also generated. RADIATION DOSE REDUCTION: This exam was performed according to the departmental dose-optimization program which includes automated exposure control, adjustment of the mA and/or kV according to patient size and/or use of iterative reconstruction technique. COMPARISON:  02/09/2022 CT head 01/05/2022 CT cervical spine FINDINGS: CT HEAD FINDINGS Brain: No evidence of acute infarct, hemorrhage, mass, mass effect, or midline shift. No hydrocephalus or extra-axial fluid collection. Vascular: No hyperdense vessel. Skull: Negative for fracture or focal lesion. Sinuses/Orbits: No acute finding. Other: The mastoid air cells are well aerated. CT CERVICAL SPINE FINDINGS Alignment: No traumatic listhesis. Skull base and vertebrae: No acute fracture or suspicious osseous lesion. Soft tissues and spinal canal: No prevertebral fluid or swelling. No visible canal hematoma. Disc levels: Degenerative changes in the  cervical spine. No significant spinal canal stenosis. Upper chest: Negative. IMPRESSION: 1. No acute intracranial process. 2. No acute fracture or traumatic listhesis in the cervical spine. Electronically Signed   By: Wiliam Ke M.D.   On: 12/16/2022 19:31   CT Head Wo Contrast  Result Date: 12/16/2022 CLINICAL DATA:  Multiple falls EXAM: CT HEAD WITHOUT CONTRAST CT CERVICAL SPINE WITHOUT CONTRAST TECHNIQUE: Multidetector CT imaging of the head and cervical spine was performed following the standard protocol without intravenous contrast. Multiplanar CT image reconstructions of the cervical spine were also generated. RADIATION DOSE REDUCTION: This exam was performed according to the departmental dose-optimization program which includes automated exposure control, adjustment of the mA and/or kV according to patient size and/or use of iterative reconstruction technique. COMPARISON:  02/09/2022 CT head 01/05/2022 CT cervical spine FINDINGS: CT HEAD FINDINGS Brain: No evidence of acute infarct, hemorrhage, mass, mass effect, or midline shift. No hydrocephalus or extra-axial fluid collection. Vascular: No hyperdense vessel. Skull: Negative for fracture or focal lesion. Sinuses/Orbits: No acute finding. Other: The mastoid air cells are well aerated. CT CERVICAL SPINE FINDINGS Alignment: No traumatic listhesis. Skull base and vertebrae: No acute fracture or suspicious osseous lesion. Soft tissues and spinal canal: No prevertebral fluid or swelling. No visible canal hematoma. Disc levels: Degenerative changes in the cervical spine. No significant spinal canal stenosis. Upper chest: Negative. IMPRESSION: 1. No acute intracranial process. 2. No acute fracture or traumatic listhesis in the cervical spine. Electronically Signed   By: Wiliam Ke M.D.   On: 12/16/2022 19:31   DG Ankle Complete Right  Result Date: 12/16/2022 CLINICAL DATA:  Pain after fall EXAM: RIGHT ANKLE - COMPLETE 3 VIEW COMPARISON:  None  Available. FINDINGS: No fracture or dislocation. Preserved joint spaces  and bone mineralization. Small well corticated plantar and Achilles calcaneal spurs. Mild soft tissue swelling. IMPRESSION: Mild soft tissue swelling about the ankle.  Calcaneal spurs. Electronically Signed   By: Karen Kays M.D.   On: 12/16/2022 19:00   DG Tibia/Fibula Right  Result Date: 12/16/2022 CLINICAL DATA:  Fall multiple times in the last 10 days. Last fall was 4 days ago. EXAM: RIGHT TIBIA AND FIBULA - 2 VIEW COMPARISON:  None Available. FINDINGS: Mild decreased bone mineralization. Status post total right knee arthroplasty. No perihardware lucency is seen to indicate hardware failure or loosening. No knee joint effusion. Mild chronic enthesopathic change at the quadriceps insertion on the patella. No acute fracture or dislocation. Vascular phleboliths overlie the anterior shin soft tissues. IMPRESSION: Status post total right knee arthroplasty without evidence of hardware failure. No acute fracture. Electronically Signed   By: Neita Garnet M.D.   On: 12/16/2022 17:04   DG Hip Unilat W or Wo Pelvis 2-3 Views Left  Result Date: 12/16/2022 CLINICAL DATA:  Left hip pain. EXAM: DG HIP (WITH OR WITHOUT PELVIS) 2-3V LEFT COMPARISON:  Radiograph dated 01/05/2022 FINDINGS: There is no acute fracture or dislocation. Mild osteopenia and mild arthritic changes of the hips. The soft tissues are unremarkable. IMPRESSION: 1. No acute fracture or dislocation. 2. Mild arthritic changes of the hips. Electronically Signed   By: Elgie Collard M.D.   On: 12/16/2022 17:03   DG Lumbar Spine Complete  Result Date: 12/16/2022 CLINICAL DATA:  back pain EXAM: LUMBAR SPINE - COMPLETE 4 VIEW COMPARISON:  None Available. FINDINGS: Five lumbar type vertebral bodies. Grade 1 anterolisthesis of L5 on S1. Lower lumbar spine predominant degenerative disc disease with disc space loss at L3-L4, L4-L5, and L5-S1. Vertebral body heights are maintained. No  definite evidence of a pars defect. Aortic atherosclerotic calcifications. Assessment of the sacrum is limited due to overlying bowel gas. IMPRESSION: 1. Lower lumbar spine predominant degenerative disc disease with disc space loss at L3-L4, L4-L5 L5-S1. 2. Grade 1 anterolisthesis of L5 on S1 with likely moderate to severe bilateral neural foraminal stenosis at this level. Electronically Signed   By: Lorenza Cambridge M.D.   On: 12/16/2022 16:59        Pauline Aus, PA-C 12/16/22 1948    Terrilee Files, MD 12/17/22 [phone]

## 2022-12-16 NOTE — ED Provider Notes (Signed)
[place] EMERGENCY DEPARTMENT AT [facility] Provider Note   CSN: [id number] Arrival date & time: 12/16/22  1527     History  Chief Complaint  Patient presents with   Marletta Lor    Chloe Golden is a 77 y.o. female.  History of diabetes, rheumatoid arthritis, hypertension, fibromyalgia, neuropathy and overactive bladder.  Presents ER today complaining of right ankle pain primarily but also right leg pain and low back pain after fall on Friday.  She states the past 10 days she is fallen twice, both times mechanical in nature, states she tripped going to the bathroom.  She states on Friday she fell very hard, she fell into the wall and fell flat on the ground, unsure if she hit her head, having headaches since she fell down, no dizziness, no syncope, no nausea or vomiting, no vision changes.    She states the primary reason she came in is because she cannot get comfortable with her right ankle, has not been able to drive due to the pain in her ankle, has pain with any movement.  No known changes in sensation.  She is not on blood thinners.   Fall       Home Medications Prior to Admission medications   Medication Sig Start Date End Date Taking? Authorizing Provider  ACCU-CHEK GUIDE test strip See admin instructions. 12/12/20   [provider]  ALPRAZolam Prudy Feeler) 0.5 MG tablet Take 0.5-1 mg by mouth See admin instructions. 0.5 mg in the morning, 1 mg at bedtime    [provider]  amLODipine (NORVASC) 5 MG tablet Take 5 mg by mouth daily. 09/20/21   [provider]  amoxicillin-clavulanate (AUGMENTIN) 875-125 MG tablet Take 1 tablet by mouth every 12 (twelve) hours. 12/04/22   Donnita Falls, FNP  baclofen (LIORESAL) 10 MG tablet Take 10 mg by mouth 2 (two) times daily. 05/04/20   [provider]  cetirizine (ZYRTEC) 10 MG tablet Take 10 mg by mouth daily. 04/17/22   [provider]  Coenzyme Q10 (COQ-10) 100 MG CAPS Take 100 mg by  mouth daily at 6 (six) AM.    [provider]  conjugated estrogens (PREMARIN) vaginal cream Place 1 applicator vaginally See admin instructions. Twice a week as directed    [provider]  Daridorexant HCl (QUVIVIQ) 50 MG TABS Take by mouth.    [provider]  estrogens, conjugated, (PREMARIN) 0.625 MG tablet Take 0.625 mg by mouth daily.    [provider]  ezetimibe (ZETIA) 10 MG tablet Take 10 mg by mouth at bedtime.  10/20/16   [provider]  fluticasone (FLONASE) 50 MCG/ACT nasal spray Place into both nostrils daily.    [provider]  furosemide (LASIX) 20 MG tablet Take 20 mg by mouth daily. 05/04/20   [provider]  gabapentin (NEURONTIN) 100 MG capsule Take 1 capsule (100 mg total) by mouth 2 (two) times daily. Take one capsule by mouth in AM, and 3 capsules by mouth at bedtime as discussed at your visit. 11/03/22   McCaughan, Dia D, DPM  GEMTESA 75 MG TABS Take 75 mg by mouth daily. 01/01/21   [provider]  HYDROcodone-acetaminophen (NORCO) 5-325 MG tablet 1-2 tabs po q6 hours prn pain 06/30/22   Betha Loa, MD  hydroxychloroquine (PLAQUENIL) 200 MG tablet Take 400 mg by mouth daily. 01/02/21   [provider]  JANUVIA 100 MG tablet Take 100 mg by mouth daily. 05/04/20   [provider]  JARDIANCE 10 MG TABS tablet Take 10 mg by mouth daily. 05/03/21   [provider]  leflunomide (ARAVA) 10 MG tablet Take 10 mg by mouth daily. 09/22/21   [provider]  loratadine (CLARITIN) 10 MG tablet Take 10 mg by mouth daily.    [provider]  losartan-hydrochlorothiazide (HYZAAR) 100-12.5 MG tablet Take 1 tablet by mouth daily.    [provider]  lubiprostone (AMITIZA) 8 MCG capsule TAKE 1 CAPSULE (8 MCG TOTAL) BY MOUTH 2 (TWO) TIMES DAILY WITH A MEAL. 09/23/22   Lanelle Bal, DO  magnesium gluconate (MAGONATE) 500 MG tablet Take 500 mg by mouth 2 (two) times  daily.    [provider]  Multiple Vitamins-Minerals (MULTIVITAMIN WITH MINERALS) tablet Take 1 tablet by mouth daily.    [provider]  nebivolol (BYSTOLIC) 5 MG tablet Take 5 mg by mouth at bedtime.    [provider]  Omega-3 Fatty Acids (FISH OIL) 1000 MG CAPS Take 1,000 mg by mouth daily.    [provider]  ondansetron (ZOFRAN) 4 MG tablet Take 1 tablet (4 mg total) by mouth every 6 (six) hours. Patient taking differently: Take 4 mg by mouth every 6 (six) hours as needed for nausea or vomiting. 05/13/21   Al Decant, PA-C  potassium chloride (KLOR-CON) 10 MEQ tablet Take 10 mEq by mouth daily. 09/20/21   [provider]  pregabalin (LYRICA) 50 MG capsule Take 50 mg by mouth 2 (two) times daily.    [provider]  propranolol (INDERAL) 10 MG tablet Take 10 mg by mouth daily.    [provider]  RABEprazole (ACIPHEX) 20 MG tablet TAKE 1 TABLET (20 MG TOTAL) BY MOUTH IN THE MORNING AND AT BEDTIME. 11/10/22 05/09/23  Lanelle Bal, DO  rOPINIRole (REQUIP) 0.5 MG tablet Take 1.5 mg by mouth at bedtime. 05/04/20   [provider]  simvastatin (ZOCOR) 20 MG tablet Take 20 mg by mouth every Monday, Wednesday, and Friday. 05/03/21   [provider]  traZODone (DESYREL) 50 MG tablet Take 50 mg by mouth at bedtime. 04/15/17   [provider]  TURMERIC CURCUMIN PO Take 1,000 mg by mouth daily.    [provider]  vortioxetine HBr (TRINTELLIX) 10 MG TABS tablet Take 10 mg by mouth daily.    [provider]  vortioxetine HBr (TRINTELLIX) 10 MG TABS tablet Take 1 tablet by mouth daily. 07/07/21   [provider]      Allergies    Trazodone, Codeine, Dicyclomine, Ibuprofen, Keflex [cephalexin], Sulfa antibiotics, and Valium [diazepam]    Review of Systems   Review of Systems  Physical Exam Updated Vital Signs BP 113/61 (BP Location: Right Arm)   Pulse 85   Temp 98.4 F  (36.9 C) (Oral)   Resp 17   Ht 5\' 4"  (1.626 m)   Wt 66.7 kg   SpO2 95%   BMI 25.23 kg/m  Physical Exam Vitals and nursing note reviewed.  Constitutional:      General: She is not in acute distress.    Appearance: She is well-developed.  HENT:     Head: Normocephalic and atraumatic.     Mouth/Throat:     Mouth: Mucous membranes are moist.  Eyes:     Conjunctiva/sclera: Conjunctivae normal.  Cardiovascular:     Rate and Rhythm: Normal rate and regular rhythm.     Heart sounds: No murmur heard. Pulmonary:     Effort:  Pulmonary effort is normal. No respiratory distress.     Breath sounds: Normal breath sounds.  Abdominal:     Palpations: Abdomen is soft.     Tenderness: There is no abdominal tenderness.  Musculoskeletal:        General: No swelling.     Cervical back: Neck supple. Tenderness present.     Comments: Tenderness right lateral ankle, patient has full intact range of motion to the ankle, DP and PT pulses are intact.  Skin:    General: Skin is warm and dry.     Capillary Refill: Capillary refill takes less than 2 seconds.  Neurological:     General: No focal deficit present.     Mental Status: She is alert and oriented to person, place, and time.  Psychiatric:        Mood and Affect: Mood normal.     ED Results / Procedures / Treatments   Labs (all labs ordered are listed, but only abnormal results are displayed) Labs Reviewed - No data to display  EKG None  Radiology DG Ankle Complete Right  Result Date: 12/16/2022 CLINICAL DATA:  Pain after fall EXAM: RIGHT ANKLE - COMPLETE 3 VIEW COMPARISON:  None Available. FINDINGS: No fracture or dislocation. Preserved joint spaces and bone mineralization. Small well corticated plantar and Achilles calcaneal spurs. Mild soft tissue swelling. IMPRESSION: Mild soft tissue swelling about the ankle.  Calcaneal spurs. Electronically Signed   By: Karen Kays M.D.   On: 12/16/2022 19:00   DG Tibia/Fibula Right  Result  Date: 12/16/2022 CLINICAL DATA:  Fall multiple times in the last 10 days. Last fall was 4 days ago. EXAM: RIGHT TIBIA AND FIBULA - 2 VIEW COMPARISON:  None Available. FINDINGS: Mild decreased bone mineralization. Status post total right knee arthroplasty. No perihardware lucency is seen to indicate hardware failure or loosening. No knee joint effusion. Mild chronic enthesopathic change at the quadriceps insertion on the patella. No acute fracture or dislocation. Vascular phleboliths overlie the anterior shin soft tissues. IMPRESSION: Status post total right knee arthroplasty without evidence of hardware failure. No acute fracture. Electronically Signed   By: Neita Garnet M.D.   On: 12/16/2022 17:04   DG Hip Unilat W or Wo Pelvis 2-3 Views Left  Result Date: 12/16/2022 CLINICAL DATA:  Left hip pain. EXAM: DG HIP (WITH OR WITHOUT PELVIS) 2-3V LEFT COMPARISON:  Radiograph dated 01/05/2022 FINDINGS: There is no acute fracture or dislocation. Mild osteopenia and mild arthritic changes of the hips. The soft tissues are unremarkable. IMPRESSION: 1. No acute fracture or dislocation. 2. Mild arthritic changes of the hips. Electronically Signed   By: Elgie Collard M.D.   On: 12/16/2022 17:03   DG Lumbar Spine Complete  Result Date: 12/16/2022 CLINICAL DATA:  back pain EXAM: LUMBAR SPINE - COMPLETE 4 VIEW COMPARISON:  None Available. FINDINGS: Five lumbar type vertebral bodies. Grade 1 anterolisthesis of L5 on S1. Lower lumbar spine predominant degenerative disc disease with disc space loss at L3-L4, L4-L5, and L5-S1. Vertebral body heights are maintained. No definite evidence of a pars defect. Aortic atherosclerotic calcifications. Assessment of the sacrum is limited due to overlying bowel gas. IMPRESSION: 1. Lower lumbar spine predominant degenerative disc disease with disc space loss at L3-L4, L4-L5 L5-S1. 2. Grade 1 anterolisthesis of L5 on S1 with likely moderate to severe bilateral neural foraminal stenosis  at this level. Electronically Signed   By: Lorenza Cambridge M.D.   On: 12/16/2022 16:59    Procedures Procedures  Medications Ordered in ED Medications  acetaminophen (TYLENOL) tablet 650 mg (has no administration in time range)    ED Course/ Medical Decision Making/ A&P                             Medical Decision Making This patient presents to the ED for concern of ankle pain, low back pain and headaches after fall 5 days ago, this involves an extensive number of treatment options, and is a complaint that carries with it a high risk of complications and morbidity.  The differential diagnosis includes, sprain, strain, contusion, concussion, intracranial hemorrhage, other   Co morbidities that complicate the patient evaluation Diabetes, hypertension, neuropathy   Additional history obtained:  Additional history obtained from EMR External records from outside source obtained and reviewed including prior notes     Imaging Studies ordered:  I ordered imaging studies including x-ray tib-fib right, left hip and lumbar spine I independently visualized and interpreted imaging which showed degenerative changes with no acute fracture dislocations I agree with the radiologist interpretation     Problem List / ED Course / Critical interventions / Medication management  Patient had a fall about 5 days ago, states she fell very hard having primarily right ankle pain since that time but she has been able to bear some weight and ambulate but states she cannot drive for do her normal activities due to the discomfort, states she has a lot of discomfort even laying in bed trying to get comfortable.  Initial x-rays were ordered from triage show no fractures or dislocations but do have some degenerative changes, will get a dedicated ankle films as that is where patient's truly hurting and due to headache since the fall and her age we will get neuroimaging without a subdural hemorrhage or other  intracranial process.  She has no focal neurologic findings on exam.  The remainder of the imaging is still pending.  I discussed with patient if these are normal we will plan to put her in an ankle brace, have her use a walker and to follow-up with orthopedics.  Advised to come back to the ER for new or worsening symptoms. I ordered medication including Tylenol for pain signed out to oncoming team.  I have reviewed the patients home medicines and have made adjustments as needed      Amount and/or Complexity of Data Reviewed Radiology: ordered.           Final Clinical Impression(s) / ED Diagnoses Final diagnoses:  Acute right ankle pain  Acute left-sided low back pain without sciatica    Rx / DC Orders ED Discharge Orders     None         Josem Kaufmann 12/16/22 1917    Terrilee Files, MD 12/17/22 [phone]

## 2022-12-16 NOTE — ED Triage Notes (Signed)
Pt come in today stating she has fell multiple times in the last 10 days. Patients last fall was Saturday 6/22. Patient states she was running to the bathroom and tripped over her feet. Patient states she has neuropathy and her feet sometimes "burn and hurt really bad."

## 2022-12-23 ENCOUNTER — Telehealth: Payer: Self-pay

## 2022-12-23 NOTE — Telephone Encounter (Signed)
Transition Care Management Unsuccessful Follow-up Telephone Call  Date of discharge and from where:  12/16/2022 [facility]  Attempts:  2nd Attempt  Reason for unsuccessful TCM follow-up call:  Left voice message  Ric Rosenberg [place]  [facility] Population Health Community Resource Care Guide   [email or web address]  ?? [phone]   Website: [email or web address]      

## 2022-12-23 NOTE — Telephone Encounter (Signed)
Transition Care Management Unsuccessful Follow-up Telephone Call  Date of discharge and from where:  12/16/2022 [facility]  Attempts:  1st Attempt  Reason for unsuccessful TCM follow-up call:  No answer/busy  Clayvon Parlett [facility]  [facility] Population Health Community Resource Care Guide   [email or web address]  ?? [phone]   Website: [email or web address]  [email or web address]

## 2022-12-29 ENCOUNTER — Ambulatory Visit (INDEPENDENT_AMBULATORY_CARE_PROVIDER_SITE_OTHER): Payer: 59 | Admitting: Podiatry

## 2022-12-29 VITALS — BP 148/81 | HR 77

## 2022-12-29 DIAGNOSIS — S93491D Sprain of other ligament of right ankle, subsequent encounter: Secondary | ICD-10-CM

## 2022-12-29 DIAGNOSIS — E1142 Type 2 diabetes mellitus with diabetic polyneuropathy: Secondary | ICD-10-CM | POA: Diagnosis not present

## 2022-12-29 MED ORDER — AMITRIPTYLINE HCL 25 MG PO TABS: 25.0000 mg | ORAL_TABLET | Freq: Every day | ORAL | 0 refills | Status: DC

## 2022-12-29 NOTE — Progress Notes (Unsigned)
Chief Complaint  Patient presents with   Peripheral Neuropathy    "They burn so bad.  They wake me up at night.  The pain starts at 6:00 pm.  I can hardly wear shoes because they make my feet swell bad.  I've fallen three times in least than 3 weeks.  I sprained my ankle, I been wearing a boot."   HPI: 77 y.o. female presents today for follow-up of her peripheral neuropathy.  She states that she is not doing any better.  She notes that she has had a couple falls and during one of her recent fall she sprained her right ankle.  She did get evaluated in the emergency room for this.  States that no fracture was noted.  They dispensed an ankle brace, which she is not wearing today, but she does not like how it fits.  In the past she has not tolerated the higher doses of gabapentin so increasing the dose at this time would not seem beneficial for her.  She is open to other options today.  She is also interested in having a nerve conduction study performed to see if there are any other causes of her neuropathy that could be treatable because she states that this is getting unbearable.  Past Medical History:  Diagnosis Date   Acid reflux    Arthritis    Degenerative arthritis of cervical spine    Diabetes mellitus without complication (HCC)    Fibromyalgia    Functional gastrointestinal disorder    GERD (gastroesophageal reflux disease)    Glaucoma    Hiatal hernia    Hypertension    Neuropathy    OAB (overactive bladder)    OSA on CPAP    stopped 6mos ago   PAD (peripheral artery disease) (HCC)    Mild by arterial Dopplers April 2019   Rheumatoid arthritis(714.0)    Venous reflux     Past Surgical History:  Procedure Laterality Date   ABDOMINAL HYSTERECTOMY     BACTERIAL OVERGROWTH TEST N/A 11/09/2012   Procedure: BACTERIAL OVERGROWTH TEST;  Surgeon: West Bali, MD;  [facility];  Service: Endoscopy;  Laterality: N/A;  7:30   BALLOON DILATION N/A 07/07/2021    Procedure: BALLOON DILATION;  Surgeon: Lanelle Bal, DO;  [facility];  Service: Endoscopy;  Laterality: N/A;   BIOPSY N/A 10/28/2012   Procedure: BIOPSY;  Surgeon: West Bali, MD;  [facility];  Service: Endoscopy;  Laterality: N/A;  DUODENAL BIOPSY   BIOPSY  07/07/2021   Procedure: BIOPSY;  Surgeon: Lanelle Bal, DO;  [facility];  Service: Endoscopy;;   BIOPSY  10/20/2021   Procedure: BIOPSY;  Surgeon: Lanelle Bal, DO;  [facility];  Service: Endoscopy;;   BLADDER SURGERY     CARPAL TUNNEL RELEASE Bilateral    CARPOMETACARPEL SUSPENSION PLASTY Right 06/30/2022   Procedure: RIGHT THUMB TRAPEZIECTOMY AND SUSPENSIONPLASTY;  Surgeon: Betha Loa, MD;  Location: [facility];  Service: Orthopedics;  Laterality: Right;  90 MIN AUX BLOCK   CHOLECYSTECTOMY     COLONOSCOPY WITH ESOPHAGOGASTRODUODENOSCOPY (EGD) N/A 10/28/2012   [id number] diverticulosis in the descending colon and sigmoid colon/Small internal hemorrhoids   COLONOSCOPY WITH PROPOFOL Left 10/20/2021   Procedure: COLONOSCOPY WITH PROPOFOL;  Surgeon: Lanelle Bal, DO;  [facility];  Service: Endoscopy;  Laterality: Left;  7:30am   ESOPHAGOGASTRODUODENOSCOPY N/A 11/27/2016   Procedure: ESOPHAGOGASTRODUODENOSCOPY (EGD);  Surgeon: Darrick Penna,  Darleene Cleaver, MD;  [facility];  Service: Endoscopy;  Laterality: N/A;  830    ESOPHAGOGASTRODUODENOSCOPY (EGD) WITH ESOPHAGEAL DILATION N/A 10/28/2012   [id number] CERVIAL WEB, S/P DILATION/MILD Non-erosive gastritis & DUODENITIS   ESOPHAGOGASTRODUODENOSCOPY (EGD) WITH PROPOFOL N/A 07/07/2021   gastritis s/p biopsy, normal duodenum. dilation.  Negative H.pylori.   POLYPECTOMY  10/20/2021   Procedure: POLYPECTOMY;  Surgeon: Lanelle Bal, DO;  [facility];  Service: Endoscopy;;   SAVORY DILATION N/A 11/27/2016   Procedure: SAVORY DILATION;  Surgeon: West Bali, MD;  [facility];  Service: Endoscopy;  Laterality: N/A;   TOTAL KNEE ARTHROPLASTY Right     Allergies  Allergen Reactions   Trazodone Other (See Comments)   Codeine Other (See Comments)    Constipation   Dicyclomine     TURNED HER SKIN BLUE   Ibuprofen Other (See Comments)    Ulcers    Keflex [Cephalexin] Nausea Only   Sulfa Antibiotics     Yeast infections    Valium [Diazepam] Other (See Comments)    Alters mental status    Physical Exam: Vitals:   12/29/22 1112  BP: (!) 148/81  Pulse: 77   General: The patient is alert and oriented x3 in no acute distress.  Dermatology: Skin is warm, dry and supple bilateral lower extremities. Interspaces are clear of maceration and debris.    Vascular: Palpable pedal pulses bilaterally. Capillary refill within normal limits.  Mild localized edema to bilateral ankles with the lateral right ankle slightly worse due to recent sprain.  Neurological: Light touch sensation absent to bilateral feet.  Protective sensation diminished to bilateral feet.  Vibratory sensation is absent to bilateral first MPJ.  Temperature sensation is absent bilateral forefoot.  Negative Tinel's sign with percussion of the posterior tibial nerve bilateral.  Musculoskeletal Exam: Mild pain on palpation of the peroneal tendons on the right as well as the anterior talofibular ligament right ankle  Assessment/Plan of Care: 1. Diabetic polyneuropathy associated with type 2 diabetes mellitus (HCC)   2. Sprain of anterior talofibular ligament of right ankle, subsequent encounter      Meds ordered this encounter  Medications   amitriptyline (ELAVIL) 25 MG tablet    Sig: Take 1 tablet (25 mg total) by mouth at bedtime. Take one tablet by mouth with dinner daily.    Dispense:  30 tablet    Refill:  0   Discussed clinical findings with patient today.  Will have the patient discontinue the gabapentin at this time.  Will have her start on amitriptyline 25 mg at dinnertime,  once daily only.  Will have her try this for 30 days and reevaluate.  Counseled patient on this medication and risks of taking med and possible side effects.  Patient will continue with her ankle brace for continued support.  She can also try an elastic ankle sleeve for compression and mild support.  Patient encouraged to follow-up with PCP regarding her elevated blood pressure.   Clerance Lav, DPM, FACFAS [facility]     [address] [place], [place] [address]                Office [phone]  Fax [phone]

## 2022-12-30 ENCOUNTER — Ambulatory Visit: Payer: 59 | Admitting: Internal Medicine

## 2022-12-30 DIAGNOSIS — R300 Dysuria: Secondary | ICD-10-CM | POA: Diagnosis not present

## 2022-12-31 ENCOUNTER — Ambulatory Visit: Payer: 59 | Admitting: Internal Medicine

## 2023-01-06 ENCOUNTER — Ambulatory Visit (INDEPENDENT_AMBULATORY_CARE_PROVIDER_SITE_OTHER): Payer: 59 | Admitting: Internal Medicine

## 2023-01-06 ENCOUNTER — Encounter: Payer: Self-pay | Admitting: Internal Medicine

## 2023-01-06 VITALS — BP 94/51 | HR 81 | Temp 97.9°F | Ht 64.0 in | Wt 148.8 lb

## 2023-01-06 DIAGNOSIS — K582 Mixed irritable bowel syndrome: Secondary | ICD-10-CM

## 2023-01-06 DIAGNOSIS — K219 Gastro-esophageal reflux disease without esophagitis: Secondary | ICD-10-CM

## 2023-01-06 DIAGNOSIS — M0609 Rheumatoid arthritis without rheumatoid factor, multiple sites: Secondary | ICD-10-CM | POA: Diagnosis not present

## 2023-01-06 DIAGNOSIS — K3184 Gastroparesis: Secondary | ICD-10-CM | POA: Diagnosis not present

## 2023-01-06 DIAGNOSIS — R634 Abnormal weight loss: Secondary | ICD-10-CM | POA: Diagnosis not present

## 2023-01-06 DIAGNOSIS — Z79899 Other long term (current) drug therapy: Secondary | ICD-10-CM | POA: Diagnosis not present

## 2023-01-06 NOTE — Progress Notes (Signed)
Referring Provider: Benita Stabile, MD Primary Care Physician:  Benita Stabile, MD Primary GI:  Dr. Marletta Lor  Chief Complaint  Patient presents with   Constipation    Follow up on constipation. Doing well with amitiza. Has BM daily.    Gastroesophageal Reflux    Follow up on GERD. Takes aciphex bid and has been doing good unless she eats a lot of tomatoes.     HPI:   Chloe Golden is a 77 y.o. female who presents to clinic today for follow-up visit.    History of IBS, mixed with constipation and diarrhea.  Underwent colonoscopy 10/20/2021 with multiple tubular adenomas removed, random colon biopsies negative for microscopic colitis.  Recommended 3-year recall  She has had issues of incontinence in the past.  She underwent anorectal manometry in 2014 at [facility].  This revealed pelvic floor dyssynergia.  Was recommended she go to physical therapy for this.  Previously seen by urology as well who again recommended pelvic floor PT.    Status post cholecystectomy.  Duodenal biopsies in the past negative for celiac disease.  Also with chronic GERD and dysphagia. Dysphagia chronic, not improved status post esophageal dilation.  Esophageal dysmotility seen on previous esophagram.  Has been on Pantoprazole, Dexilant, now on Rabeprazole.  Today, GERD doing well on Rabeprazole. Bowels moving well. Taking Amitiza as well as an OTC stool softener but she is not sure which one.   Continues to lose weight.  Down to 148 pounds today.  Gastric imaging study with delayed gastric imaging consistent with gastroparesis.  States her appetite is good.  Denies any nausea or vomiting.  Past Medical History:  Diagnosis Date   Acid reflux    Arthritis    Degenerative arthritis of cervical spine    Diabetes mellitus without complication (HCC)    Fibromyalgia    Functional gastrointestinal disorder    GERD (gastroesophageal reflux disease)    Glaucoma    Hiatal hernia    Hypertension    Neuropathy     OAB (overactive bladder)    OSA on CPAP    stopped 6mos ago   PAD (peripheral artery disease) (HCC)    Mild by arterial Dopplers April 2019   Rheumatoid arthritis(714.0)    Venous reflux     Past Surgical History:  Procedure Laterality Date   ABDOMINAL HYSTERECTOMY     BACTERIAL OVERGROWTH TEST N/A 11/09/2012   Procedure: BACTERIAL OVERGROWTH TEST;  Surgeon: West Bali, MD;  [facility];  Service: Endoscopy;  Laterality: N/A;  7:30   BALLOON DILATION N/A 07/07/2021   Procedure: BALLOON DILATION;  Surgeon: Lanelle Bal, DO;  [facility];  Service: Endoscopy;  Laterality: N/A;   BIOPSY N/A 10/28/2012   Procedure: BIOPSY;  Surgeon: West Bali, MD;  [facility];  Service: Endoscopy;  Laterality: N/A;  DUODENAL BIOPSY   BIOPSY  07/07/2021   Procedure: BIOPSY;  Surgeon: Lanelle Bal, DO;  [facility];  Service: Endoscopy;;   BIOPSY  10/20/2021   Procedure: BIOPSY;  Surgeon: Lanelle Bal, DO;  [facility];  Service: Endoscopy;;   BLADDER SURGERY     CARPAL TUNNEL RELEASE Bilateral    CARPOMETACARPEL SUSPENSION PLASTY Right 06/30/2022   Procedure: RIGHT THUMB TRAPEZIECTOMY AND SUSPENSIONPLASTY;  Surgeon: Betha Loa, MD;  Location: [facility];  Service: Orthopedics;  Laterality: Right;  90 MIN AUX BLOCK   CHOLECYSTECTOMY  COLONOSCOPY WITH ESOPHAGOGASTRODUODENOSCOPY (EGD) N/A 10/28/2012   UEA:[id number] diverticulosis in the descending colon and sigmoid colon/Small internal hemorrhoids   COLONOSCOPY WITH PROPOFOL Left 10/20/2021   Procedure: COLONOSCOPY WITH PROPOFOL;  Surgeon: Lanelle Bal, DO;  [facility];  Service: Endoscopy;  Laterality: Left;  7:30am   ESOPHAGOGASTRODUODENOSCOPY N/A 11/27/2016   Procedure: ESOPHAGOGASTRODUODENOSCOPY (EGD);  Surgeon: West Bali, MD;  [facility];  Service: Endoscopy;  Laterality: N/A;  830    ESOPHAGOGASTRODUODENOSCOPY  (EGD) WITH ESOPHAGEAL DILATION N/A 10/28/2012   JYN:WGNFAOZH CERVIAL WEB, S/P DILATION/MILD Non-erosive gastritis & DUODENITIS   ESOPHAGOGASTRODUODENOSCOPY (EGD) WITH PROPOFOL N/A 07/07/2021   gastritis s/p biopsy, normal duodenum. dilation.  Negative H.pylori.   POLYPECTOMY  10/20/2021   Procedure: POLYPECTOMY;  Surgeon: Lanelle Bal, DO;  [facility];  Service: Endoscopy;;   SAVORY DILATION N/A 11/27/2016   Procedure: SAVORY DILATION;  Surgeon: West Bali, MD;  [facility];  Service: Endoscopy;  Laterality: N/A;   TOTAL KNEE ARTHROPLASTY Right     Current Outpatient Medications  Medication Sig Dispense Refill   ACCU-CHEK GUIDE test strip See admin instructions.     ALPRAZolam (XANAX) 0.5 MG tablet Take 0.5-1 mg by mouth See admin instructions. 0.5 mg in the morning, 1 mg at bedtime     amitriptyline (ELAVIL) 25 MG tablet Take 1 tablet (25 mg total) by mouth at bedtime. Take one tablet by mouth with dinner daily. 30 tablet 0   amLODipine (NORVASC) 5 MG tablet Take 5 mg by mouth daily.     baclofen (LIORESAL) 10 MG tablet Take 10 mg by mouth 2 (two) times daily.     cetirizine (ZYRTEC) 10 MG tablet Take 10 mg by mouth daily.     Coenzyme Q10 (COQ-10) 100 MG CAPS Take 100 mg by mouth daily at 6 (six) AM.     conjugated estrogens (PREMARIN) vaginal cream Place 1 applicator vaginally See admin instructions. Twice a week as directed     fluticasone (FLONASE) 50 MCG/ACT nasal spray Place into both nostrils daily.     furosemide (LASIX) 20 MG tablet Take 20 mg by mouth daily.     GEMTESA 75 MG TABS Take 75 mg by mouth daily.     hydroxychloroquine (PLAQUENIL) 200 MG tablet Take 200 mg by mouth daily.     JANUVIA 100 MG tablet Take 100 mg by mouth daily.     JARDIANCE 10 MG TABS tablet Take 10 mg by mouth daily.     leflunomide (ARAVA) 10 MG tablet Take 10 mg by mouth daily.     losartan-hydrochlorothiazide (HYZAAR) 100-12.5 MG tablet Take 1 tablet by mouth  daily.     lubiprostone (AMITIZA) 8 MCG capsule TAKE 1 CAPSULE (8 MCG TOTAL) BY MOUTH 2 (TWO) TIMES DAILY WITH A MEAL. 60 capsule 3   magnesium gluconate (MAGONATE) 500 MG tablet Take 500 mg by mouth 2 (two) times daily.     memantine (NAMENDA) 5 MG tablet Take 5 mg by mouth 2 (two) times daily.     Multiple Vitamins-Minerals (MULTIVITAMIN WITH MINERALS) tablet Take 1 tablet by mouth daily.     Omega-3 Fatty Acids (FISH OIL) 1000 MG CAPS Take 1,000 mg by mouth daily.     propranolol (INDERAL) 10 MG tablet Take 10 mg by mouth daily.     RABEprazole (ACIPHEX) 20 MG tablet TAKE 1 TABLET (20 MG TOTAL) BY MOUTH IN THE MORNING AND AT BEDTIME. 60 tablet 5  rOPINIRole (REQUIP) 0.5 MG tablet Take 1.5 mg by mouth at bedtime.     simvastatin (ZOCOR) 20 MG tablet Take 20 mg by mouth every Monday, Wednesday, and Friday.     traZODone (DESYREL) 50 MG tablet Take 50 mg by mouth at bedtime.     vortioxetine HBr (TRINTELLIX) 10 MG TABS tablet Take 10 mg by mouth daily.     No current facility-administered medications for this visit.    Allergies as of 01/06/2023 - Review Complete 01/06/2023  Allergen Reaction Noted   Trazodone Other (See Comments) 07/14/2022   Codeine Other (See Comments) 09/26/2012   Dicyclomine  01/05/2013   Ibuprofen Other (See Comments) 09/26/2012   Keflex [cephalexin] Nausea Only 12/04/2022   Sulfa antibiotics  11/24/2016   Valium [diazepam] Other (See Comments) 09/26/2012    Family History  Problem Relation Age of Onset   Lung cancer Father    Diabetes Mellitus II Father    Crohn's disease Cousin    Hypertension Mother    Cystic fibrosis Mother    Lung cancer Brother    Hypertension Brother    Lupus Niece    Colon cancer Neg Hx    Celiac disease Neg Hx    Gastric cancer Neg Hx    Esophageal cancer Neg Hx     Social History   Socioeconomic History   Marital status: Single    Spouse name: Not on file   Number of children: Not on file   Years of education: Not on  file   Highest education level: Not on file  Occupational History   Not on file  Tobacco Use   Smoking status: Never    Passive exposure: Past   Smokeless tobacco: Never  Vaping Use   Vaping status: Never Used  Substance and Sexual Activity   Alcohol use: No   Drug use: No   Sexual activity: Yes    Birth control/protection: Surgical    Comment: hyst  Other Topics Concern   Not on file  Social History Narrative   1 living   2 deceased   Social Determinants of Health   Financial Resource Strain: Not on file  Food Insecurity: Not on file  Transportation Needs: Not on file  Physical Activity: Not on file  Stress: Not on file  Social Connections: Not on file    Subjective: Review of Systems  Constitutional:  Negative for chills and fever.  HENT:  Negative for congestion and hearing loss.   Eyes:  Negative for blurred vision and double vision.  Respiratory:  Negative for cough and shortness of breath.   Cardiovascular:  Negative for chest pain and palpitations.  Gastrointestinal:  Positive for diarrhea. Negative for abdominal pain, blood in stool, constipation, heartburn, melena and vomiting.       Fecal incontinence  Genitourinary:  Negative for dysuria and urgency.  Musculoskeletal:  Negative for joint pain and myalgias.  Skin:  Negative for itching and rash.  Neurological:  Negative for dizziness and headaches.  Psychiatric/Behavioral:  Negative for depression. The patient is not nervous/anxious.      Objective: BP (!) 94/51 (BP Location: Left Arm, Patient Position: Sitting, Cuff Size: Normal)   Pulse 81   Temp 97.9 F (36.6 C) (Oral)   Ht 5\' 4"  (1.626 m)   Wt 148 lb 12.8 oz (67.5 kg)   BMI 25.54 kg/m  Physical Exam Constitutional:      Appearance: Normal appearance.  HENT:     Head: Normocephalic and atraumatic.  Eyes:     Extraocular Movements: Extraocular movements intact.     Conjunctiva/sclera: Conjunctivae normal.  Cardiovascular:     Rate and  Rhythm: Normal rate and regular rhythm.  Pulmonary:     Effort: Pulmonary effort is normal.     Breath sounds: Normal breath sounds.  Abdominal:     General: Bowel sounds are normal.     Palpations: Abdomen is soft.  Musculoskeletal:        General: No swelling. Normal range of motion.     Cervical back: Normal range of motion and neck supple.  Skin:    General: Skin is warm and dry.     Coloration: Skin is not jaundiced.  Neurological:     General: No focal deficit present.     Mental Status: She is alert and oriented to person, place, and time.  Psychiatric:        Mood and Affect: Mood normal.        Behavior: Behavior normal.      Assessment: *Irritable bowel syndrome with constipation and diarrhea *Chronic GERD *Dysphagia-chronic, due to dysmotility *Weight loss *Gastroparesis  Plan: Patient's IBS appears to be well-controlled diet present with Amitiza twice daily as well as over-the-counter herbal gut health supplement.  Dysphagia chronic, not improved status post esophageal dilation.  Likely due to esophageal dysmotility which was seen on previous esophagram.  Continue conservative management for this.    Patient to avoid tough textures. All meats should be chopped finely. Eat slowly, take small bites, chew thoroughly, and drink plenty of liquids throughout meals.   GERD well-controlled on rabeprazole twice daily.  Will continue.  Discussed gastroparesis in depth with patient today: 4-6 small meals daily Low fat diet Low fiber diet (avoid raw fruits and vegetables).   Follow-up 3-4 months  01/06/2023 10:35 AM   Disclaimer: This note was dictated with voice recognition software. Similar sounding words can inadvertently be transcribed and may not be corrected upon review.

## 2023-01-06 NOTE — Patient Instructions (Addendum)
I am happy to hear that you are doing well.  Continue on omeprazole for your acid reflux.   For your constipation continue Amitiza as well as the herbal supplement you are taking.  Continue to watch her weight.  It sounds like your appetite is good.  We will keep an eye on this.   Follow-up in 3 to 4 months.   It is always a pleasure seeing you.   Dr. Marletta Lor

## 2023-01-07 DIAGNOSIS — M180 Bilateral primary osteoarthritis of first carpometacarpal joints: Secondary | ICD-10-CM | POA: Diagnosis not present

## 2023-01-20 ENCOUNTER — Ambulatory Visit: Payer: 59 | Admitting: Gastroenterology

## 2023-01-25 ENCOUNTER — Ambulatory Visit (INDEPENDENT_AMBULATORY_CARE_PROVIDER_SITE_OTHER): Payer: 59 | Admitting: Podiatry

## 2023-01-25 DIAGNOSIS — G6289 Other specified polyneuropathies: Secondary | ICD-10-CM | POA: Diagnosis not present

## 2023-01-25 DIAGNOSIS — S93401S Sprain of unspecified ligament of right ankle, sequela: Secondary | ICD-10-CM | POA: Diagnosis not present

## 2023-01-25 MED ORDER — AMITRIPTYLINE HCL 25 MG PO TABS: 25.0000 mg | ORAL_TABLET | Freq: Every day | ORAL | 3 refills | Status: DC

## 2023-01-25 NOTE — Progress Notes (Signed)
Chief Complaint  Patient presents with   Peripheral Neuropathy    Pt stated that she still gets constant pain it has not got ant better since the last time she was here.   HPI: 77 y.o. female presents today for follow-up of bilateral peripheral neuropathy.  She notes that the amitriptyline is helping better than the gabapentin was.  She is wanting a refill long-term for the amitriptyline 25 mg nightly.  She is also having residual pain in the upper outer aspect of the right leg following her previous fall/ankle sprain.  She is open to going to physical therapy.  She notes that her EMG still has not been scheduled.  Past Medical History:  Diagnosis Date   Acid reflux    Arthritis    Degenerative arthritis of cervical spine    Diabetes mellitus without complication (HCC)    Fibromyalgia    Functional gastrointestinal disorder    GERD (gastroesophageal reflux disease)    Glaucoma    Hiatal hernia    Hypertension    Neuropathy    OAB (overactive bladder)    OSA on CPAP    stopped 6mos ago   PAD (peripheral artery disease) (HCC)    Mild by arterial Dopplers April 2019   Rheumatoid arthritis(714.0)    Venous reflux     Past Surgical History:  Procedure Laterality Date   ABDOMINAL HYSTERECTOMY     BACTERIAL OVERGROWTH TEST N/A 11/09/2012   Procedure: BACTERIAL OVERGROWTH TEST;  Surgeon: West Bali, MD;  [facility];  Service: Endoscopy;  Laterality: N/A;  7:30   BALLOON DILATION N/A 07/07/2021   Procedure: BALLOON DILATION;  Surgeon: Lanelle Bal, DO;  [facility];  Service: Endoscopy;  Laterality: N/A;   BIOPSY N/A 10/28/2012   Procedure: BIOPSY;  Surgeon: West Bali, MD;  [facility];  Service: Endoscopy;  Laterality: N/A;  DUODENAL BIOPSY   BIOPSY  07/07/2021   Procedure: BIOPSY;  Surgeon: Lanelle Bal, DO;  [facility];  Service: Endoscopy;;   BIOPSY  10/20/2021   Procedure: BIOPSY;  Surgeon: Lanelle Bal, DO;  [facility];  Service: Endoscopy;;   BLADDER SURGERY     CARPAL TUNNEL RELEASE Bilateral    CARPOMETACARPEL SUSPENSION PLASTY Right 06/30/2022   Procedure: RIGHT THUMB TRAPEZIECTOMY AND SUSPENSIONPLASTY;  Surgeon: Betha Loa, MD;  Location: [facility];  Service: Orthopedics;  Laterality: Right;  90 MIN AUX BLOCK   CHOLECYSTECTOMY     COLONOSCOPY WITH ESOPHAGOGASTRODUODENOSCOPY (EGD) N/A 10/28/2012   YNW:GNFAOZHY diverticulosis in the descending colon and sigmoid colon/Small internal hemorrhoids   COLONOSCOPY WITH PROPOFOL Left 10/20/2021   Procedure: COLONOSCOPY WITH PROPOFOL;  Surgeon: Lanelle Bal, DO;  [facility];  Service: Endoscopy;  Laterality: Left;  7:30am   ESOPHAGOGASTRODUODENOSCOPY N/A 11/27/2016   Procedure: ESOPHAGOGASTRODUODENOSCOPY (EGD);  Surgeon: West Bali, MD;  [facility];  Service: Endoscopy;  Laterality: N/A;  830    ESOPHAGOGASTRODUODENOSCOPY (EGD) WITH ESOPHAGEAL DILATION N/A 10/28/2012   QMV:HQIONGEX CERVIAL WEB, S/P DILATION/MILD Non-erosive gastritis & DUODENITIS   ESOPHAGOGASTRODUODENOSCOPY (EGD) WITH PROPOFOL N/A 07/07/2021   gastritis s/p biopsy, normal duodenum. dilation.  Negative H.pylori.   POLYPECTOMY  10/20/2021   Procedure: POLYPECTOMY;  Surgeon: Lanelle Bal, DO;  [facility];  Service: Endoscopy;;   SAVORY DILATION N/A 11/27/2016   Procedure: SAVORY DILATION;  Surgeon: West Bali, MD;  [facility];  Service: Endoscopy;  Laterality: N/A;   TOTAL KNEE ARTHROPLASTY Right     Allergies  Allergen Reactions   Trazodone Other (See Comments)   Codeine Other (See Comments)    Constipation   Dicyclomine     TURNED HER SKIN BLUE   Ibuprofen Other (See Comments)    Ulcers    Keflex [Cephalexin] Nausea Only   Sulfa Antibiotics     Yeast infections    Valium [Diazepam] Other (See Comments)    Alters mental status    Physical Exam: There  were no vitals filed for this visit.  General: The patient is alert and oriented x3 in no acute distress.  Dermatology: Skin is warm, dry and supple bilateral lower extremities. Interspaces are clear of maceration and debris.    Vascular: Palpable pedal pulses bilaterally. Capillary refill within normal limits.   No erythema or calor.  Neurological: Light touch sensation diminished bilateral feet.  Protective sensation diminished to the forefoot but not the rear foot.  Musculoskeletal Exam: There is pain on palpation to the lateral musculature in the right leg as well as the anterolateral portion of the tibialis anterior muscle groups.  No pain on palpation of the peroneal tendons around the lateral malleolus.  There is small amount of localized edema to the lateral right ankle.  No crepitus on ankle range of motion noted.   Assessment/Plan of Care: 1. Mild ankle sprain, right, sequela   2. Other polyneuropathy      Meds ordered this encounter  Medications   amitriptyline (ELAVIL) 25 MG tablet    Sig: Take 1 tablet (25 mg total) by mouth at bedtime. Take one tablet by mouth with dinner daily.    Dispense:  90 tablet    Refill:  3   AMB REFERRAL TO PHYSICAL THERAPY  Discussed clinical findings with patient today.  Patient's amitriptyline was renewed today.  Will do 90-day refills for total of 360 days dosing.  She will continue to take this in the evening.  Will keep her at 25 mg for now.    Will check into the referral for her nerve conduction study.  She is wanting answers to see if there is something that is treatable or curable regarding her neuropathy pain.  She states that this is progressively worsening and would like to find some better resolution.  Patient will benefit from physical therapy for the residual effects from her right lateral ankle sprain/fall.  Order written and dispensed to patient today.  She prefers to go to [facility] in [place].   Clerance Lav,  DPM, FACFAS [facility]     [address] [place], [place] [address]                Office [phone]  Fax [phone]

## 2023-01-27 ENCOUNTER — Other Ambulatory Visit: Payer: 59 | Admitting: Urology

## 2023-02-01 DIAGNOSIS — M180 Bilateral primary osteoarthritis of first carpometacarpal joints: Secondary | ICD-10-CM | POA: Diagnosis not present

## 2023-02-03 ENCOUNTER — Other Ambulatory Visit: Payer: 59 | Admitting: Urology

## 2023-02-05 DIAGNOSIS — R2689 Other abnormalities of gait and mobility: Secondary | ICD-10-CM | POA: Diagnosis not present

## 2023-02-05 DIAGNOSIS — M62571 Muscle wasting and atrophy, not elsewhere classified, right ankle and foot: Secondary | ICD-10-CM | POA: Diagnosis not present

## 2023-02-05 DIAGNOSIS — M25671 Stiffness of right ankle, not elsewhere classified: Secondary | ICD-10-CM | POA: Diagnosis not present

## 2023-02-05 DIAGNOSIS — M25571 Pain in right ankle and joints of right foot: Secondary | ICD-10-CM | POA: Diagnosis not present

## 2023-02-10 DIAGNOSIS — Z79899 Other long term (current) drug therapy: Secondary | ICD-10-CM | POA: Diagnosis not present

## 2023-02-10 DIAGNOSIS — M25571 Pain in right ankle and joints of right foot: Secondary | ICD-10-CM | POA: Diagnosis not present

## 2023-02-10 DIAGNOSIS — R2689 Other abnormalities of gait and mobility: Secondary | ICD-10-CM | POA: Diagnosis not present

## 2023-02-10 DIAGNOSIS — M25671 Stiffness of right ankle, not elsewhere classified: Secondary | ICD-10-CM | POA: Diagnosis not present

## 2023-02-10 DIAGNOSIS — F0394 Unspecified dementia, unspecified severity, with anxiety: Secondary | ICD-10-CM | POA: Diagnosis not present

## 2023-02-10 DIAGNOSIS — M62571 Muscle wasting and atrophy, not elsewhere classified, right ankle and foot: Secondary | ICD-10-CM | POA: Diagnosis not present

## 2023-02-17 DIAGNOSIS — M25571 Pain in right ankle and joints of right foot: Secondary | ICD-10-CM | POA: Diagnosis not present

## 2023-02-17 DIAGNOSIS — R2689 Other abnormalities of gait and mobility: Secondary | ICD-10-CM | POA: Diagnosis not present

## 2023-02-17 DIAGNOSIS — M25671 Stiffness of right ankle, not elsewhere classified: Secondary | ICD-10-CM | POA: Diagnosis not present

## 2023-02-17 DIAGNOSIS — M62571 Muscle wasting and atrophy, not elsewhere classified, right ankle and foot: Secondary | ICD-10-CM | POA: Diagnosis not present

## 2023-02-24 ENCOUNTER — Encounter: Payer: Self-pay | Admitting: Diagnostic Neuroimaging

## 2023-02-24 ENCOUNTER — Ambulatory Visit (INDEPENDENT_AMBULATORY_CARE_PROVIDER_SITE_OTHER): Payer: 59 | Admitting: Diagnostic Neuroimaging

## 2023-02-24 VITALS — BP 119/74 | HR 85 | Ht 64.0 in | Wt 149.0 lb

## 2023-02-24 DIAGNOSIS — R413 Other amnesia: Secondary | ICD-10-CM

## 2023-02-24 DIAGNOSIS — F32A Depression, unspecified: Secondary | ICD-10-CM | POA: Diagnosis not present

## 2023-02-24 DIAGNOSIS — R269 Unspecified abnormalities of gait and mobility: Secondary | ICD-10-CM | POA: Diagnosis not present

## 2023-02-24 DIAGNOSIS — F419 Anxiety disorder, unspecified: Secondary | ICD-10-CM

## 2023-02-24 NOTE — Patient Instructions (Addendum)
  MEMORY LOSS (likely due to chronic pain, insomnia, depression, anxiety) - check ATN panel - hold off on memantine and donepezil - safety / supervision issues reviewed - daily physical activity / exercise (at least 15-30 minutes) - eat more plants / vegetables - increase social activities, brain stimulation, games, puzzles, hobbies, crafts, arts, music - aim for at least 7-8 hours sleep per night (or more) - avoid smoking and alcohol - caution with medications, finances, driving - recommend referral to psychiatry / psychology   RECURRENT FALLS (previously; due to neuropathy and polypharmacy) - recommend PT evaluation; use cane / walker at all times   NEUROPATHY (diabetic) - continue supportive care; gabapentin and amitriptyline   FIBROMYALGIA / RHEUMATOID ARTHRITIS / OSTEOARTHRITIS - continue supportive care; follow up with Dr. Deanne Coffer

## 2023-02-24 NOTE — Progress Notes (Signed)
[facility]  PATIENT: Chloe Golden DOB: 1945-10-08  REFERRING CLINICIAN: Leone Payor, FNP HISTORY FROM: patient  REASON FOR VISIT: follow up   HISTORICAL  CHIEF COMPLAINT:  Chief Complaint  Patient presents with   New Problem     Rm 6, here alone  Pt is here for Dementia. Pt states she has fallen several times, states she feels depressed. Pt states she has difficulty getting words out, states she is forgetting things she has been told to do.     HISTORY OF PRESENT ILLNESS:   UPDATE (02/24/23, VRP): Since last visit, doing about the same. Continues with memory / attention issues. Still feeling nervous / anxious. More balance issues. More depression issues.  PRIOR HPI (09/28/22, VRP): 77 year old female here for evaluation of head injuries, falls, neuropathy.  Patient 3 patient was having increasing problems with balance and walking.  She had multiple falls, up to 8 times over 6 months.  She hit her head several times.  Balance issues related to 20-year history of neuropathy.  Also with fibromyalgia, rheumatoid arthritis and osteoarthritis.  Also with insomnia and polypharmacy.  Since her medications have been reduced she feels more clearheaded and balance is improved.  No ongoing sequelae from her head injuries.  Previously was having some issues with memory loss related to chronic pain and insomnia.  Patient was on room.  Able to maintain all of her ADLs.   REVIEW OF SYSTEMS: Full 14 system review of systems performed and negative with exception of: as per HPI.  ALLERGIES: Allergies  Allergen Reactions   Trazodone Other (See Comments)   Codeine Other (See Comments)    Constipation   Dicyclomine     TURNED HER SKIN BLUE   Ibuprofen Other (See Comments)    Ulcers    Keflex [Cephalexin] Nausea Only   Sulfa Antibiotics     Yeast infections    Valium [Diazepam] Other (See Comments)    Alters mental status    HOME MEDICATIONS: Outpatient Medications  Prior to Visit  Medication Sig Dispense Refill   ACCU-CHEK GUIDE test strip See admin instructions.     ALPRAZolam (XANAX) 0.5 MG tablet Take 0.5-1 mg by mouth See admin instructions. 0.5 mg in the morning, 1 mg at bedtime     amitriptyline (ELAVIL) 25 MG tablet Take 1 tablet (25 mg total) by mouth at bedtime. Take one tablet by mouth with dinner daily. 90 tablet 3   amLODipine (NORVASC) 5 MG tablet Take 5 mg by mouth daily.     baclofen (LIORESAL) 10 MG tablet Take 10 mg by mouth 2 (two) times daily.     cetirizine (ZYRTEC) 10 MG tablet Take 10 mg by mouth daily.     Coenzyme Q10 (COQ-10) 100 MG CAPS Take 100 mg by mouth daily at 6 (six) AM.     conjugated estrogens (PREMARIN) vaginal cream Place 1 applicator vaginally See admin instructions. Twice a week as directed     Daridorexant HCl (QUVIVIQ) 50 MG TABS Take 50 mg by mouth Nightly.     fluticasone (FLONASE) 50 MCG/ACT nasal spray Place into both nostrils daily.     furosemide (LASIX) 20 MG tablet Take 20 mg by mouth daily.     GEMTESA 75 MG TABS Take 75 mg by mouth daily.     hydroxychloroquine (PLAQUENIL) 200 MG tablet Take 200 mg by mouth daily.     JANUVIA 100 MG tablet Take 100 mg by mouth daily.     JARDIANCE  10 MG TABS tablet Take 10 mg by mouth daily.     leflunomide (ARAVA) 10 MG tablet Take 10 mg by mouth daily.     losartan-hydrochlorothiazide (HYZAAR) 100-12.5 MG tablet Take 1 tablet by mouth daily.     lubiprostone (AMITIZA) 8 MCG capsule TAKE 1 CAPSULE (8 MCG TOTAL) BY MOUTH 2 (TWO) TIMES DAILY WITH A MEAL. 60 capsule 3   magnesium gluconate (MAGONATE) 500 MG tablet Take 500 mg by mouth 2 (two) times daily.     Multiple Vitamins-Minerals (MULTIVITAMIN WITH MINERALS) tablet Take 1 tablet by mouth daily.     Omega-3 Fatty Acids (FISH OIL) 1000 MG CAPS Take 1,000 mg by mouth daily.     propranolol (INDERAL) 10 MG tablet Take 10 mg by mouth daily.     RABEprazole (ACIPHEX) 20 MG tablet TAKE 1 TABLET (20 MG TOTAL) BY MOUTH IN  THE MORNING AND AT BEDTIME. 60 tablet 5   rOPINIRole (REQUIP) 0.5 MG tablet Take 1.5 mg by mouth at bedtime.     simvastatin (ZOCOR) 20 MG tablet Take 20 mg by mouth every Monday, Wednesday, and Friday.     traZODone (DESYREL) 50 MG tablet Take 50 mg by mouth at bedtime.     vortioxetine HBr (TRINTELLIX) 10 MG TABS tablet Take 10 mg by mouth daily.     donepezil (ARICEPT) 10 MG tablet Take 10 mg by mouth daily.     gabapentin (NEURONTIN) 100 MG capsule Take 100 mg by mouth 3 (three) times daily.     imipramine (TOFRANIL) 10 MG tablet Take 10 mg by mouth at bedtime.     imipramine (TOFRANIL) 25 MG tablet Take 25 mg by mouth at bedtime.     memantine (NAMENDA) 5 MG tablet Take 5 mg by mouth 2 (two) times daily.     oxyCODONE-acetaminophen (PERCOCET/ROXICET) 5-325 MG tablet Take 2 tablets by mouth every 6 (six) hours as needed.     No facility-administered medications prior to visit.    PAST MEDICAL HISTORY: Past Medical History:  Diagnosis Date   Acid reflux    Arthritis    Degenerative arthritis of cervical spine    Diabetes mellitus without complication (HCC)    Fibromyalgia    Functional gastrointestinal disorder    GERD (gastroesophageal reflux disease)    Glaucoma    Hiatal hernia    Hypertension    Neuropathy    OAB (overactive bladder)    OSA on CPAP    stopped 6mos ago   PAD (peripheral artery disease) (HCC)    Mild by arterial Dopplers April 2019   Rheumatoid arthritis(714.0)    Venous reflux     PAST SURGICAL HISTORY: Past Surgical History:  Procedure Laterality Date   ABDOMINAL HYSTERECTOMY     BACTERIAL OVERGROWTH TEST N/A 11/09/2012   Procedure: BACTERIAL OVERGROWTH TEST;  Surgeon: West Bali, MD;  [facility];  Service: Endoscopy;  Laterality: N/A;  7:30   BALLOON DILATION N/A 07/07/2021   Procedure: BALLOON DILATION;  Surgeon: Lanelle Bal, DO;  [facility];  Service: Endoscopy;  Laterality: N/A;   BIOPSY N/A 10/28/2012    Procedure: BIOPSY;  Surgeon: West Bali, MD;  [facility];  Service: Endoscopy;  Laterality: N/A;  DUODENAL BIOPSY   BIOPSY  07/07/2021   Procedure: BIOPSY;  Surgeon: Lanelle Bal, DO;  [facility];  Service: Endoscopy;;   BIOPSY  10/20/2021   Procedure: BIOPSY;  Surgeon: Lanelle Bal, DO;  [facility];  Service: Endoscopy;;   BLADDER SURGERY     CARPAL TUNNEL RELEASE Bilateral    CARPOMETACARPEL SUSPENSION PLASTY Right 06/30/2022   Procedure: RIGHT THUMB TRAPEZIECTOMY AND SUSPENSIONPLASTY;  Surgeon: Betha Loa, MD;  Location: [facility];  Service: Orthopedics;  Laterality: Right;  90 MIN AUX BLOCK   CHOLECYSTECTOMY     COLONOSCOPY WITH ESOPHAGOGASTRODUODENOSCOPY (EGD) N/A 10/28/2012   ZOX:WRUEAVWU diverticulosis in the descending colon and sigmoid colon/Small internal hemorrhoids   COLONOSCOPY WITH PROPOFOL Left 10/20/2021   Procedure: COLONOSCOPY WITH PROPOFOL;  Surgeon: Lanelle Bal, DO;  [facility];  Service: Endoscopy;  Laterality: Left;  7:30am   ESOPHAGOGASTRODUODENOSCOPY N/A 11/27/2016   Procedure: ESOPHAGOGASTRODUODENOSCOPY (EGD);  Surgeon: West Bali, MD;  [facility];  Service: Endoscopy;  Laterality: N/A;  830    ESOPHAGOGASTRODUODENOSCOPY (EGD) WITH ESOPHAGEAL DILATION N/A 10/28/2012   JWJ:[id number] CERVIAL WEB, S/P DILATION/MILD Non-erosive gastritis & DUODENITIS   ESOPHAGOGASTRODUODENOSCOPY (EGD) WITH PROPOFOL N/A 07/07/2021   gastritis s/p biopsy, normal duodenum. dilation.  Negative H.pylori.   POLYPECTOMY  10/20/2021   Procedure: POLYPECTOMY;  Surgeon: Lanelle Bal, DO;  [facility];  Service: Endoscopy;;   SAVORY DILATION N/A 11/27/2016   Procedure: SAVORY DILATION;  Surgeon: West Bali, MD;  [facility];  Service: Endoscopy;  Laterality: N/A;   TOTAL KNEE ARTHROPLASTY Right     FAMILY HISTORY: Family History  Problem Relation Age of  Onset   Lung cancer Father    Diabetes Mellitus II Father    Crohn's disease Cousin    Hypertension Mother    Cystic fibrosis Mother    Lung cancer Brother    Hypertension Brother    Lupus Niece    Colon cancer Neg Hx    Celiac disease Neg Hx    Gastric cancer Neg Hx    Esophageal cancer Neg Hx     SOCIAL HISTORY: Social History   Socioeconomic History   Marital status: Single    Spouse name: Not on file   Number of children: Not on file   Years of education: Not on file   Highest education level: Not on file  Occupational History   Not on file  Tobacco Use   Smoking status: Never    Passive exposure: Past   Smokeless tobacco: Never  Vaping Use   Vaping status: Never Used  Substance and Sexual Activity   Alcohol use: No   Drug use: No   Sexual activity: Yes    Birth control/protection: Surgical    Comment: hyst  Other Topics Concern   Not on file  Social History Narrative   1 living   2 deceased   Social Determinants of Health   Financial Resource Strain: Not on file  Food Insecurity: Not on file  Transportation Needs: Not on file  Physical Activity: Not on file  Stress: Not on file  Social Connections: Not on file  Intimate Partner Violence: Not on file     PHYSICAL EXAM  GENERAL EXAM/CONSTITUTIONAL: Vitals:  Vitals:   02/24/23 1118  BP: 119/74  Pulse: 85  Weight: 149 lb (67.6 kg)  Height: 5\' 4"  (1.626 m)   Body mass index is 25.58 kg/m. Wt Readings from Last 3 Encounters:  02/24/23 149 lb (67.6 kg)  01/06/23 148 lb 12.8 oz (67.5 kg)  12/16/22 147 lb (66.7 kg)   Patient is in no distress; well developed, nourished and groomed; neck is supple  CARDIOVASCULAR: Examination of carotid arteries is normal; no carotid bruits Regular rate and rhythm, no murmurs Examination of peripheral vascular system by observation and palpation is normal  EYES: Ophthalmoscopic exam of optic discs and posterior segments is normal; no papilledema or  hemorrhages No results found.  MUSCULOSKELETAL: Gait, strength, tone, movements noted in Neurologic exam below  NEUROLOGIC: MENTAL STATUS:     02/24/2023   11:22 AM  MMSE - Mini Mental State Exam  Orientation to time 5  Orientation to Place 5  Registration 3  Attention/ Calculation 5  Recall 3  Language- name 2 objects 2  Language- repeat 1  Language- follow 3 step command 3  Language- read & follow direction 1  Write a sentence 1  Copy design 0  Total score 29   awake, alert, oriented to person, place and time recent and remote memory intact normal attention and concentration language fluent, comprehension intact, naming intact fund of knowledge appropriate  CRANIAL NERVE:  2nd - no papilledema on fundoscopic exam 2nd, 3rd, 4th, 6th - pupils equal and reactive to light, visual fields full to confrontation, extraocular muscles intact, no nystagmus 5th - facial sensation symmetric 7th - facial strength symmetric 8th - hearing intact 9th - palate elevates symmetrically, uvula midline 11th - shoulder shrug symmetric 12th - tongue protrusion midline  MOTOR:  normal bulk and tone, full strength in the BUE, BLE  SENSORY:  normal and symmetric to light touch, pinprick, temperature, vibration; EXCEPT DECR PP IN FEET/ ANKLES  COORDINATION:  finger-nose-finger, fine finger movements normal  REFLEXES:  deep tendon reflexes TRACE and symmetric  GAIT/STATION:  SHORT STEPS; SLOW, STOOPED POSTURE, UNSTEADY     DIAGNOSTIC DATA (LABS, IMAGING, TESTING) - I reviewed patient records, labs, notes, testing and imaging myself where available.  Lab Results  Component Value Date   WBC 6.3 02/09/2022   HGB 14.6 02/09/2022   HCT 46.1 (H) 02/09/2022   MCV 93.7 02/09/2022   PLT 268 02/09/2022      Component Value Date/Time   NA 139 06/30/2022 1215   K 3.2 (L) 06/30/2022 1215   CL 101 06/30/2022 1215   CO2 27 06/30/2022 1215   GLUCOSE 88 06/30/2022 1215   BUN 13  06/30/2022 1215   CREATININE 0.85 06/30/2022 1215   CALCIUM 9.2 06/30/2022 1215   PROT 7.4 02/09/2022 1318   ALBUMIN 3.9 02/09/2022 1318   AST 23 02/09/2022 1318   ALT 29 02/09/2022 1318   ALKPHOS 83 02/09/2022 1318   BILITOT 0.3 02/09/2022 1318   GFRNONAA >60 06/30/2022 1215   GFRAA 82 (L) 09/26/2012 2027   No results found for: "CHOL", "HDL", "LDLCALC", "LDLDIRECT", "TRIG", "CHOLHDL" No results found for: "HGBA1C" No results found for: "VITAMINB12" No results found for: "TSH"    12/16/22 CT head / cervical spine 1. No acute intracranial process. 2. No acute fracture or traumatic listhesis in the cervical spine.   ASSESSMENT AND PLAN  77 y.o. year old female here with:   Dx:  1. Memory loss   2. Gait difficulty   3. Anxiety   4. Depression, unspecified depression type     PLAN:  MEMORY LOSS (likely due to chronic pain, insomnia, depression, anxiety) - check ATN panel - hold off on memantine and donepezil - safety / supervision issues reviewed - daily physical activity / exercise (at least 15-30 minutes) - eat more plants / vegetables - increase social activities, brain stimulation, games, puzzles, hobbies, crafts, arts, music - aim for at least  7-8 hours sleep per night (or more) - avoid smoking and alcohol - caution with medications, finances, driving - recommend referral to psychiatry / psychology   RECURRENT FALLS (previously; due to neuropathy and polypharmacy) - recommend PT evaluation; use cane / walker at all times   NEUROPATHY (diabetic) - continue supportive care; gabapentin and amitriptyline   FIBROMYALGIA / RHEUMATOID ARTHRITIS / OSTEOARTHRITIS - continue supportive care; follow up with Dr. Deanne Coffer   Orders Placed This Encounter  Procedures   Ambulatory referral to Psychiatry   Return for pending test results, pending if symptoms worsen or fail to improve, return to PCP.    Suanne Marker, MD 02/24/2023, 12:24 PM Certified in  Neurology, Neurophysiology and Neuroimaging  [facility] [address], [address] [place], [place] [address] [phone]

## 2023-02-26 DIAGNOSIS — M25671 Stiffness of right ankle, not elsewhere classified: Secondary | ICD-10-CM | POA: Diagnosis not present

## 2023-02-26 DIAGNOSIS — R2689 Other abnormalities of gait and mobility: Secondary | ICD-10-CM | POA: Diagnosis not present

## 2023-02-26 DIAGNOSIS — M62571 Muscle wasting and atrophy, not elsewhere classified, right ankle and foot: Secondary | ICD-10-CM | POA: Diagnosis not present

## 2023-02-26 DIAGNOSIS — M25571 Pain in right ankle and joints of right foot: Secondary | ICD-10-CM | POA: Diagnosis not present

## 2023-03-01 ENCOUNTER — Telehealth: Payer: Self-pay | Admitting: Diagnostic Neuroimaging

## 2023-03-01 NOTE — Telephone Encounter (Signed)
Referral for psychiatry fax to [facility].  Phone: [phone], Fax: [phone].

## 2023-03-03 DIAGNOSIS — R2689 Other abnormalities of gait and mobility: Secondary | ICD-10-CM | POA: Diagnosis not present

## 2023-03-03 DIAGNOSIS — M25671 Stiffness of right ankle, not elsewhere classified: Secondary | ICD-10-CM | POA: Diagnosis not present

## 2023-03-03 DIAGNOSIS — M25571 Pain in right ankle and joints of right foot: Secondary | ICD-10-CM | POA: Diagnosis not present

## 2023-03-03 DIAGNOSIS — M62571 Muscle wasting and atrophy, not elsewhere classified, right ankle and foot: Secondary | ICD-10-CM | POA: Diagnosis not present

## 2023-03-05 DIAGNOSIS — R2689 Other abnormalities of gait and mobility: Secondary | ICD-10-CM | POA: Diagnosis not present

## 2023-03-05 DIAGNOSIS — M25571 Pain in right ankle and joints of right foot: Secondary | ICD-10-CM | POA: Diagnosis not present

## 2023-03-05 DIAGNOSIS — M25671 Stiffness of right ankle, not elsewhere classified: Secondary | ICD-10-CM | POA: Diagnosis not present

## 2023-03-05 DIAGNOSIS — M62571 Muscle wasting and atrophy, not elsewhere classified, right ankle and foot: Secondary | ICD-10-CM | POA: Diagnosis not present

## 2023-03-09 DIAGNOSIS — M25671 Stiffness of right ankle, not elsewhere classified: Secondary | ICD-10-CM | POA: Diagnosis not present

## 2023-03-09 DIAGNOSIS — M25571 Pain in right ankle and joints of right foot: Secondary | ICD-10-CM | POA: Diagnosis not present

## 2023-03-09 DIAGNOSIS — R2689 Other abnormalities of gait and mobility: Secondary | ICD-10-CM | POA: Diagnosis not present

## 2023-03-09 DIAGNOSIS — M62571 Muscle wasting and atrophy, not elsewhere classified, right ankle and foot: Secondary | ICD-10-CM | POA: Diagnosis not present

## 2023-03-12 DIAGNOSIS — M62571 Muscle wasting and atrophy, not elsewhere classified, right ankle and foot: Secondary | ICD-10-CM | POA: Diagnosis not present

## 2023-03-12 DIAGNOSIS — M25671 Stiffness of right ankle, not elsewhere classified: Secondary | ICD-10-CM | POA: Diagnosis not present

## 2023-03-12 DIAGNOSIS — R2689 Other abnormalities of gait and mobility: Secondary | ICD-10-CM | POA: Diagnosis not present

## 2023-03-12 DIAGNOSIS — M25571 Pain in right ankle and joints of right foot: Secondary | ICD-10-CM | POA: Diagnosis not present

## 2023-03-16 DIAGNOSIS — M25571 Pain in right ankle and joints of right foot: Secondary | ICD-10-CM | POA: Diagnosis not present

## 2023-03-16 DIAGNOSIS — M25671 Stiffness of right ankle, not elsewhere classified: Secondary | ICD-10-CM | POA: Diagnosis not present

## 2023-03-16 DIAGNOSIS — R2689 Other abnormalities of gait and mobility: Secondary | ICD-10-CM | POA: Diagnosis not present

## 2023-03-16 DIAGNOSIS — M62571 Muscle wasting and atrophy, not elsewhere classified, right ankle and foot: Secondary | ICD-10-CM | POA: Diagnosis not present

## 2023-03-18 DIAGNOSIS — M62571 Muscle wasting and atrophy, not elsewhere classified, right ankle and foot: Secondary | ICD-10-CM | POA: Diagnosis not present

## 2023-03-18 DIAGNOSIS — R2689 Other abnormalities of gait and mobility: Secondary | ICD-10-CM | POA: Diagnosis not present

## 2023-03-18 DIAGNOSIS — M25571 Pain in right ankle and joints of right foot: Secondary | ICD-10-CM | POA: Diagnosis not present

## 2023-03-18 DIAGNOSIS — M25671 Stiffness of right ankle, not elsewhere classified: Secondary | ICD-10-CM | POA: Diagnosis not present

## 2023-03-23 ENCOUNTER — Ambulatory Visit: Payer: 59 | Admitting: Podiatry

## 2023-03-23 DIAGNOSIS — Z79899 Other long term (current) drug therapy: Secondary | ICD-10-CM | POA: Diagnosis not present

## 2023-03-23 DIAGNOSIS — G6289 Other specified polyneuropathies: Secondary | ICD-10-CM

## 2023-03-23 DIAGNOSIS — K219 Gastro-esophageal reflux disease without esophagitis: Secondary | ICD-10-CM | POA: Diagnosis not present

## 2023-03-23 DIAGNOSIS — R269 Unspecified abnormalities of gait and mobility: Secondary | ICD-10-CM

## 2023-03-23 DIAGNOSIS — M797 Fibromyalgia: Secondary | ICD-10-CM | POA: Diagnosis not present

## 2023-03-23 DIAGNOSIS — M79644 Pain in right finger(s): Secondary | ICD-10-CM | POA: Diagnosis not present

## 2023-03-23 DIAGNOSIS — M1990 Unspecified osteoarthritis, unspecified site: Secondary | ICD-10-CM | POA: Diagnosis not present

## 2023-03-23 DIAGNOSIS — M3505 Sjogren syndrome with inflammatory arthritis: Secondary | ICD-10-CM | POA: Diagnosis not present

## 2023-03-23 DIAGNOSIS — M0609 Rheumatoid arthritis without rheumatoid factor, multiple sites: Secondary | ICD-10-CM | POA: Diagnosis not present

## 2023-03-23 NOTE — Progress Notes (Unsigned)
Chief Complaint  Patient presents with   ANKLE AND NEUROPATHY    STATES "DOING GOOD", HAS 2 MORE DAYS OF PT,    HPI: 77 y.o. female presents today after attending physical therapy for the right ankle pain.  She states she has a couple visits left and feels that she is doing much better with regard to that discomfort.  She symptoms feels that her right lateral ankle will roll outward when walking.  She notes that she has been waiting since the summer to have her EMG/NCV performed to find the cause of her unsteady gait and muscle weakness in the lower extremity.  Apologized to the patient noting that we do not have anyone in that office due to short staffing to schedule these for patients at this point in time.  Patient has an appointment with rheumatology this afternoon.  Past Medical History:  Diagnosis Date   Acid reflux    Arthritis    Degenerative arthritis of cervical spine    Diabetes mellitus without complication (HCC)    Fibromyalgia    Functional gastrointestinal disorder    GERD (gastroesophageal reflux disease)    Glaucoma    Hiatal hernia    Hypertension    Neuropathy    OAB (overactive bladder)    OSA on CPAP    stopped 6mos ago   PAD (peripheral artery disease) (HCC)    Mild by arterial Dopplers April 2019   Rheumatoid arthritis(714.0)    Venous reflux     Past Surgical History:  Procedure Laterality Date   ABDOMINAL HYSTERECTOMY     BACTERIAL OVERGROWTH TEST N/A 11/09/2012   Procedure: BACTERIAL OVERGROWTH TEST;  Surgeon: West Bali, MD;  [facility];  Service: Endoscopy;  Laterality: N/A;  7:30   BALLOON DILATION N/A 07/07/2021   Procedure: BALLOON DILATION;  Surgeon: Lanelle Bal, DO;  [facility];  Service: Endoscopy;  Laterality: N/A;   BIOPSY N/A 10/28/2012   Procedure: BIOPSY;  Surgeon: West Bali, MD;  [facility];  Service: Endoscopy;  Laterality: N/A;  DUODENAL BIOPSY   BIOPSY  07/07/2021   Procedure:  BIOPSY;  Surgeon: Lanelle Bal, DO;  [facility];  Service: Endoscopy;;   BIOPSY  10/20/2021   Procedure: BIOPSY;  Surgeon: Lanelle Bal, DO;  [facility];  Service: Endoscopy;;   BLADDER SURGERY     CARPAL TUNNEL RELEASE Bilateral    CARPOMETACARPEL SUSPENSION PLASTY Right 06/30/2022   Procedure: RIGHT THUMB TRAPEZIECTOMY AND SUSPENSIONPLASTY;  Surgeon: Betha Loa, MD;  Location: [facility];  Service: Orthopedics;  Laterality: Right;  90 MIN AUX BLOCK   CHOLECYSTECTOMY     COLONOSCOPY WITH ESOPHAGOGASTRODUODENOSCOPY (EGD) N/A 10/28/2012   ZOX:WRUEAVWU diverticulosis in the descending colon and sigmoid colon/Small internal hemorrhoids   COLONOSCOPY WITH PROPOFOL Left 10/20/2021   Procedure: COLONOSCOPY WITH PROPOFOL;  Surgeon: Lanelle Bal, DO;  [facility];  Service: Endoscopy;  Laterality: Left;  7:30am   ESOPHAGOGASTRODUODENOSCOPY N/A 11/27/2016   Procedure: ESOPHAGOGASTRODUODENOSCOPY (EGD);  Surgeon: West Bali, MD;  [facility];  Service: Endoscopy;  Laterality: N/A;  830    ESOPHAGOGASTRODUODENOSCOPY (EGD) WITH ESOPHAGEAL DILATION N/A 10/28/2012   JWJ:XBJYNWGN CERVIAL WEB, S/P DILATION/MILD Non-erosive gastritis & DUODENITIS   ESOPHAGOGASTRODUODENOSCOPY (EGD) WITH PROPOFOL N/A 07/07/2021   gastritis s/p biopsy, normal duodenum. dilation.  Negative H.pylori.   POLYPECTOMY  10/20/2021   Procedure: POLYPECTOMY;  Surgeon: Earnest Bailey  K, DO;  [facility];  Service: Endoscopy;;   SAVORY DILATION N/A 11/27/2016   Procedure: SAVORY DILATION;  Surgeon: West Bali, MD;  [facility];  Service: Endoscopy;  Laterality: N/A;   TOTAL KNEE ARTHROPLASTY Right     Allergies  Allergen Reactions   Trazodone Other (See Comments)   Codeine Other (See Comments)    Constipation   Dicyclomine     TURNED HER SKIN BLUE   Ibuprofen Other (See Comments)    Ulcers    Keflex [Cephalexin]  Nausea Only   Sulfa Antibiotics     Yeast infections    Valium [Diazepam] Other (See Comments)    Alters mental status   Physical Exam: General: The patient is alert and oriented x3 in no acute distress.  Dermatology: Skin is warm, dry and supple bilateral lower extremities. Interspaces are clear of maceration and debris.    Vascular: Palpable pedal pulses bilaterally. Capillary refill within normal limits.  No appreciable edema.  No erythema or calor.  Neurological: Light touch sensation grossly intact bilateral feet.   Musculoskeletal Exam: Minimal pain on palpation of the right anterolateral ankle joint.  No crepitus with range of motion of the ankle.  No pain with subtalar range of motion. patient does have an unsteady gait on weightbearing and ambulation.  She does have a slight C-shaped foot on the right which typically places more force and pressure towards the lateral aspect of the foot.  Assessment/Plan of Care: 1. Other polyneuropathy   2. Gait disturbance     AMB REFERRAL TO NEUROLOGY  Discussed clinical findings with patient today.  Will place an order for neurology consult today.  Hopefully this may be able to facilitate her being seen and also be evaluated for EMG/NCV in the near future.  Apologized once again to the patient for the delay.  As she will finish out her current physical therapy and may discharge at that time.  A small lateral wedge was placed under the insole of her right shoe to see if this is substantial enough support to keep her ankle from feeling like it is going to give out a roll outward.  Clerance Lav, DPM, FACFAS [facility]     [address] [place], [place] [address]                Office [phone]  Fax [phone]

## 2023-03-24 DIAGNOSIS — M25671 Stiffness of right ankle, not elsewhere classified: Secondary | ICD-10-CM | POA: Diagnosis not present

## 2023-03-24 DIAGNOSIS — M62571 Muscle wasting and atrophy, not elsewhere classified, right ankle and foot: Secondary | ICD-10-CM | POA: Diagnosis not present

## 2023-03-24 DIAGNOSIS — M25571 Pain in right ankle and joints of right foot: Secondary | ICD-10-CM | POA: Diagnosis not present

## 2023-03-24 DIAGNOSIS — R2689 Other abnormalities of gait and mobility: Secondary | ICD-10-CM | POA: Diagnosis not present

## 2023-03-26 DIAGNOSIS — M62571 Muscle wasting and atrophy, not elsewhere classified, right ankle and foot: Secondary | ICD-10-CM | POA: Diagnosis not present

## 2023-03-26 DIAGNOSIS — M25571 Pain in right ankle and joints of right foot: Secondary | ICD-10-CM | POA: Diagnosis not present

## 2023-03-26 DIAGNOSIS — M25671 Stiffness of right ankle, not elsewhere classified: Secondary | ICD-10-CM | POA: Diagnosis not present

## 2023-03-26 DIAGNOSIS — R2689 Other abnormalities of gait and mobility: Secondary | ICD-10-CM | POA: Diagnosis not present

## 2023-04-07 DIAGNOSIS — M180 Bilateral primary osteoarthritis of first carpometacarpal joints: Secondary | ICD-10-CM | POA: Diagnosis not present

## 2023-04-07 DIAGNOSIS — M25532 Pain in left wrist: Secondary | ICD-10-CM | POA: Diagnosis not present

## 2023-04-14 ENCOUNTER — Other Ambulatory Visit (HOSPITAL_COMMUNITY): Payer: Self-pay | Admitting: Internal Medicine

## 2023-04-14 DIAGNOSIS — Z1231 Encounter for screening mammogram for malignant neoplasm of breast: Secondary | ICD-10-CM

## 2023-04-21 DIAGNOSIS — E1165 Type 2 diabetes mellitus with hyperglycemia: Secondary | ICD-10-CM | POA: Diagnosis not present

## 2023-04-21 DIAGNOSIS — I10 Essential (primary) hypertension: Secondary | ICD-10-CM | POA: Diagnosis not present

## 2023-04-27 DIAGNOSIS — I10 Essential (primary) hypertension: Secondary | ICD-10-CM | POA: Diagnosis not present

## 2023-04-27 DIAGNOSIS — E1165 Type 2 diabetes mellitus with hyperglycemia: Secondary | ICD-10-CM | POA: Diagnosis not present

## 2023-04-27 DIAGNOSIS — Z23 Encounter for immunization: Secondary | ICD-10-CM | POA: Diagnosis not present

## 2023-04-27 DIAGNOSIS — R251 Tremor, unspecified: Secondary | ICD-10-CM | POA: Diagnosis not present

## 2023-04-27 DIAGNOSIS — G4730 Sleep apnea, unspecified: Secondary | ICD-10-CM | POA: Diagnosis not present

## 2023-04-27 DIAGNOSIS — R296 Repeated falls: Secondary | ICD-10-CM | POA: Diagnosis not present

## 2023-04-27 DIAGNOSIS — R944 Abnormal results of kidney function studies: Secondary | ICD-10-CM | POA: Diagnosis not present

## 2023-04-27 DIAGNOSIS — J302 Other seasonal allergic rhinitis: Secondary | ICD-10-CM | POA: Diagnosis not present

## 2023-04-29 ENCOUNTER — Other Ambulatory Visit: Payer: Self-pay | Admitting: Internal Medicine

## 2023-04-29 DIAGNOSIS — K219 Gastro-esophageal reflux disease without esophagitis: Secondary | ICD-10-CM

## 2023-04-29 DIAGNOSIS — K582 Mixed irritable bowel syndrome: Secondary | ICD-10-CM

## 2023-05-05 ENCOUNTER — Other Ambulatory Visit: Payer: Self-pay

## 2023-05-05 DIAGNOSIS — K5900 Constipation, unspecified: Secondary | ICD-10-CM

## 2023-05-05 DIAGNOSIS — K582 Mixed irritable bowel syndrome: Secondary | ICD-10-CM

## 2023-05-05 MED ORDER — LUBIPROSTONE 8 MCG PO CAPS: 8.0000 ug | ORAL_CAPSULE | Freq: Two times a day (BID) | ORAL | 3 refills | Status: DC

## 2023-05-14 ENCOUNTER — Other Ambulatory Visit (HOSPITAL_COMMUNITY): Payer: Self-pay | Admitting: Family Medicine

## 2023-05-14 DIAGNOSIS — Z13820 Encounter for screening for osteoporosis: Secondary | ICD-10-CM

## 2023-05-24 ENCOUNTER — Ambulatory Visit (HOSPITAL_COMMUNITY)
Admission: RE | Admit: 2023-05-24 | Discharge: 2023-05-24 | Disposition: A | Discharge status: Home / Self Care | Payer: 59 | Source: Ambulatory Visit | Attending: Internal Medicine | Admitting: Internal Medicine

## 2023-05-24 ENCOUNTER — Other Ambulatory Visit (HOSPITAL_COMMUNITY): Payer: 59

## 2023-05-24 DIAGNOSIS — Z1231 Encounter for screening mammogram for malignant neoplasm of breast: Secondary | ICD-10-CM | POA: Diagnosis not present

## 2023-05-28 ENCOUNTER — Ambulatory Visit (HOSPITAL_COMMUNITY)
Admission: RE | Admit: 2023-05-28 | Discharge: 2023-05-28 | Disposition: A | Discharge status: Home / Self Care | Payer: 59 | Source: Ambulatory Visit | Attending: Family Medicine | Admitting: Family Medicine

## 2023-05-28 DIAGNOSIS — Z13820 Encounter for screening for osteoporosis: Secondary | ICD-10-CM | POA: Insufficient documentation

## 2023-05-28 DIAGNOSIS — M810 Age-related osteoporosis without current pathological fracture: Secondary | ICD-10-CM | POA: Diagnosis not present

## 2023-05-28 DIAGNOSIS — M069 Rheumatoid arthritis, unspecified: Secondary | ICD-10-CM | POA: Diagnosis not present

## 2023-05-28 DIAGNOSIS — Z780 Asymptomatic menopausal state: Secondary | ICD-10-CM | POA: Insufficient documentation

## 2023-06-01 ENCOUNTER — Other Ambulatory Visit: Payer: Self-pay | Admitting: Podiatry

## 2023-06-01 ENCOUNTER — Telehealth: Payer: Self-pay

## 2023-06-01 DIAGNOSIS — E1142 Type 2 diabetes mellitus with diabetic polyneuropathy: Secondary | ICD-10-CM

## 2023-06-01 MED ORDER — CAPSAICIN-CLEANSING GEL 8 % EX KIT: 4.0000 | PACK | Freq: Once | CUTANEOUS | Status: DC

## 2023-06-01 MED ORDER — QUTENZA (4 PATCH) 8 % EX KIT: 4.0000 | PACK | Freq: Once | CUTANEOUS | 0 refills | Status: AC

## 2023-06-01 NOTE — Progress Notes (Signed)
Received message from [place] and our prior Auth department noting that the patient will need the Qutenza prescription sent to the specialty pharmacy at [facility] specialty pharmacy in [place], [place] on [address].  This was sent today and any other prior Auth paperwork will be forwarded to our triage department

## 2023-06-01 NOTE — Progress Notes (Signed)
Corrected Rx for Qutenza to outpatient Rx instead of clinic - administered medication.

## 2023-06-01 NOTE — Telephone Encounter (Signed)
Returned the pt's call and was advised that her Rabeprazole is not working for her. She spoke with her pharmacist and was advised that this medication is for ulcers. The pt wants something different sent in for her or what do you recommend. Pt states she is taking the medication as prescribed.

## 2023-06-02 NOTE — Telephone Encounter (Signed)
She is on this medication for acid reflux which she told me it is well-controlled as long as she takes it.  She having breakthrough reflux symptoms?

## 2023-06-03 NOTE — Telephone Encounter (Signed)
Dr Marletta Lor  Phoned and spoke with the pt and was advised "her abd was burning". She stated it was her fault. She had been taking Apple Cider Vinegar". She stated after she stopped taking it she got better. She states just leave things the way they are for now and if she has anymore problems she will call us back.

## 2023-06-04 ENCOUNTER — Telehealth: Payer: Self-pay

## 2023-06-04 NOTE — Telephone Encounter (Signed)
Noted  

## 2023-06-04 NOTE — Telephone Encounter (Signed)
PA request for Qutenza 8% 4 patch kit. PA submitted through covermymeds and waiting on reply.

## 2023-06-13 DIAGNOSIS — J9801 Acute bronchospasm: Secondary | ICD-10-CM | POA: Diagnosis not present

## 2023-06-13 DIAGNOSIS — J209 Acute bronchitis, unspecified: Secondary | ICD-10-CM | POA: Diagnosis not present

## 2023-06-13 DIAGNOSIS — R059 Cough, unspecified: Secondary | ICD-10-CM | POA: Diagnosis not present

## 2023-06-13 DIAGNOSIS — U071 COVID-19: Secondary | ICD-10-CM | POA: Diagnosis not present

## 2023-07-01 ENCOUNTER — Telehealth: Payer: Self-pay

## 2023-07-01 NOTE — Telephone Encounter (Signed)
 [facility] called - Asking when the patient is scheduled for the procedure and also to verify the shipping address is still [address] [employer] [phone]

## 2023-07-02 DIAGNOSIS — N3000 Acute cystitis without hematuria: Secondary | ICD-10-CM | POA: Diagnosis not present

## 2023-07-02 DIAGNOSIS — N3030 Trigonitis without hematuria: Secondary | ICD-10-CM | POA: Diagnosis not present

## 2023-07-07 ENCOUNTER — Telehealth: Payer: Self-pay | Admitting: Podiatry

## 2023-07-07 NOTE — Telephone Encounter (Addendum)
 Called pt to schedule qutenza  application, pt stated they will call back to schedule due to their husband being hospitalized. Per Dr. McCaughan, please schedule her at the end of day for application as the appointment will take ~1 hr. Please schedule her the week starting 07/12/2023 or after.

## 2023-07-16 NOTE — Telephone Encounter (Signed)
I called pt to follow up getting her scheduled for Qutenza application. Pt said she would call back in a week or so to schedule appt.

## 2023-07-28 NOTE — Telephone Encounter (Signed)
 Appt scheduled 08/16/2023 at 3:30 pm in [place].

## 2023-08-03 ENCOUNTER — Ambulatory Visit: Payer: 59 | Admitting: Podiatry

## 2023-08-16 ENCOUNTER — Encounter: Payer: 59 | Admitting: Podiatry

## 2023-08-16 NOTE — Progress Notes (Signed)
 Patient did not show for her appointment today to have the Qutenza application performed.

## 2023-08-25 ENCOUNTER — Other Ambulatory Visit: Payer: Self-pay | Admitting: Internal Medicine

## 2023-08-25 DIAGNOSIS — K582 Mixed irritable bowel syndrome: Secondary | ICD-10-CM

## 2023-08-25 DIAGNOSIS — K5900 Constipation, unspecified: Secondary | ICD-10-CM

## 2023-08-30 DIAGNOSIS — H905 Unspecified sensorineural hearing loss: Secondary | ICD-10-CM | POA: Diagnosis not present

## 2023-08-31 DIAGNOSIS — M1812 Unilateral primary osteoarthritis of first carpometacarpal joint, left hand: Secondary | ICD-10-CM | POA: Diagnosis not present

## 2023-09-02 DIAGNOSIS — M1990 Unspecified osteoarthritis, unspecified site: Secondary | ICD-10-CM | POA: Diagnosis not present

## 2023-09-02 DIAGNOSIS — M3505 Sjogren syndrome with inflammatory arthritis: Secondary | ICD-10-CM | POA: Diagnosis not present

## 2023-09-02 DIAGNOSIS — M797 Fibromyalgia: Secondary | ICD-10-CM | POA: Diagnosis not present

## 2023-09-02 DIAGNOSIS — Z79899 Other long term (current) drug therapy: Secondary | ICD-10-CM | POA: Diagnosis not present

## 2023-09-02 DIAGNOSIS — M79644 Pain in right finger(s): Secondary | ICD-10-CM | POA: Diagnosis not present

## 2023-09-02 DIAGNOSIS — M0609 Rheumatoid arthritis without rheumatoid factor, multiple sites: Secondary | ICD-10-CM | POA: Diagnosis not present

## 2023-09-02 DIAGNOSIS — K219 Gastro-esophageal reflux disease without esophagitis: Secondary | ICD-10-CM | POA: Diagnosis not present

## 2023-09-13 ENCOUNTER — Ambulatory Visit (INDEPENDENT_AMBULATORY_CARE_PROVIDER_SITE_OTHER): Admitting: Podiatry

## 2023-09-13 ENCOUNTER — Encounter: Payer: Self-pay | Admitting: Podiatry

## 2023-09-13 DIAGNOSIS — E1142 Type 2 diabetes mellitus with diabetic polyneuropathy: Secondary | ICD-10-CM

## 2023-09-13 MED ORDER — CAPSAICIN-CLEANSING GEL 8 % EX KIT: 4.0000 | PACK | Freq: Once | CUTANEOUS | Status: DC

## 2023-09-13 NOTE — Progress Notes (Signed)
 Chief Complaint  Patient presents with   Peripheral Neuropathy    "They're still hot and burning up."   HPI: 78 y.o. female presents today for application of 4 sheets of Qutenza to bilateral foot today (2 sheets per foot).  She notes she is optimistic that this will help the burning/tingling in her feet, which is worse at night.  She had to keep postponing her appointment due to her husband needing emergency heart surgery with associated health problems.    Past Medical History:  Diagnosis Date   Acid reflux    Arthritis    Degenerative arthritis of cervical spine    Diabetes mellitus without complication (HCC)    Fibromyalgia    Functional gastrointestinal disorder    GERD (gastroesophageal reflux disease)    Glaucoma    Hiatal hernia    Hypertension    Neuropathy    OAB (overactive bladder)    OSA on CPAP    stopped 6mos ago   PAD (peripheral artery disease) (HCC)    Mild by arterial Dopplers April 2019   Rheumatoid arthritis(714.0)    Venous reflux     Past Surgical History:  Procedure Laterality Date   ABDOMINAL HYSTERECTOMY     BACTERIAL OVERGROWTH TEST N/A 11/09/2012   Procedure: BACTERIAL OVERGROWTH TEST;  Surgeon: West Bali, MD;  [facility];  Service: Endoscopy;  Laterality: N/A;  7:30   BALLOON DILATION N/A 07/07/2021   Procedure: BALLOON DILATION;  Surgeon: Lanelle Bal, DO;  [facility];  Service: Endoscopy;  Laterality: N/A;   BIOPSY N/A 10/28/2012   Procedure: BIOPSY;  Surgeon: West Bali, MD;  [facility];  Service: Endoscopy;  Laterality: N/A;  DUODENAL BIOPSY   BIOPSY  07/07/2021   Procedure: BIOPSY;  Surgeon: Lanelle Bal, DO;  [facility];  Service: Endoscopy;;   BIOPSY  10/20/2021   Procedure: BIOPSY;  Surgeon: Lanelle Bal, DO;  [facility];  Service: Endoscopy;;   BLADDER SURGERY     CARPAL TUNNEL RELEASE Bilateral    CARPOMETACARPEL SUSPENSION PLASTY Right  06/30/2022   Procedure: RIGHT THUMB TRAPEZIECTOMY AND SUSPENSIONPLASTY;  Surgeon: Betha Loa, MD;  Location: [facility];  Service: Orthopedics;  Laterality: Right;  90 MIN AUX BLOCK   CHOLECYSTECTOMY     COLONOSCOPY WITH ESOPHAGOGASTRODUODENOSCOPY (EGD) N/A 10/28/2012   FAO:[id number] diverticulosis in the descending colon and sigmoid colon/Small internal hemorrhoids   COLONOSCOPY WITH PROPOFOL Left 10/20/2021   Procedure: COLONOSCOPY WITH PROPOFOL;  Surgeon: Lanelle Bal, DO;  [facility];  Service: Endoscopy;  Laterality: Left;  7:30am   ESOPHAGOGASTRODUODENOSCOPY N/A 11/27/2016   Procedure: ESOPHAGOGASTRODUODENOSCOPY (EGD);  Surgeon: West Bali, MD;  [facility];  Service: Endoscopy;  Laterality: N/A;  830    ESOPHAGOGASTRODUODENOSCOPY (EGD) WITH ESOPHAGEAL DILATION N/A 10/28/2012   ION:[id number] CERVIAL WEB, S/P DILATION/MILD Non-erosive gastritis & DUODENITIS   ESOPHAGOGASTRODUODENOSCOPY (EGD) WITH PROPOFOL N/A 07/07/2021   gastritis s/p biopsy, normal duodenum. dilation.  Negative H.pylori.   POLYPECTOMY  10/20/2021   Procedure: POLYPECTOMY;  Surgeon: Lanelle Bal, DO;  [facility];  Service: Endoscopy;;   SAVORY DILATION N/A 11/27/2016   Procedure: SAVORY DILATION;  Surgeon: West Bali, MD;  [facility];  Service: Endoscopy;  Laterality: N/A;   TOTAL KNEE ARTHROPLASTY Right     Allergies  Allergen Reactions   Trazodone Other (See Comments)   Codeine  Other (See Comments)    Constipation   Dicyclomine     TURNED HER SKIN BLUE   Ibuprofen Other (See Comments)    Ulcers    Keflex [Cephalexin] Nausea Only   Sulfa Antibiotics     Yeast infections    Valium [Diazepam] Other (See Comments)    Alters mental status    Review of Systems  Neurological:  Positive for tingling and sensory change.      Physical Exam: Palpable pedal pulses bilateral.  No open lesions or areas of irritated skin.   Interspaces clear.  Decreased vibratory sensation bilateral foot.   Assessment/Plan of Care: 1. Diabetic polyneuropathy associated with type 2 diabetes mellitus (HCC)     Discussed clinical findings with patient today.  Explained how the Qutenza will act as a neurolytic agent on the peripheral nerves of the feet to decrease her diabetic neuropathy discomfort.   Reviewed Qutenza application with patient today.  Once applied, she'll sit for 30 minutes while the medication penetrates into the skin.  4 sheets were applied, 2 on each foot.  After 30 minutes, these were discarded and the skin was cleansed with the skin cleanser solution.    Patient advised to avoid direct sunlight to skin of feet for one week, avoid hot tubs, or any "hot" linaments to the feet.  Avoid hot baths or hot showers, as this may aggravate burning of the skin.    Will f/u in 91 days for Qutenza application #2.   Clerance Lav, DPM, FACFAS [facility]     [address] [place], [place] [address]                Office [phone]  Fax [phone]

## 2023-09-16 ENCOUNTER — Telehealth: Payer: Self-pay | Admitting: *Deleted

## 2023-09-16 ENCOUNTER — Ambulatory Visit: Admitting: Internal Medicine

## 2023-09-16 ENCOUNTER — Encounter: Payer: Self-pay | Admitting: Internal Medicine

## 2023-09-16 VITALS — BP 136/68 | HR 93 | Temp 98.1°F | Ht 64.0 in | Wt 148.0 lb

## 2023-09-16 DIAGNOSIS — R1319 Other dysphagia: Secondary | ICD-10-CM

## 2023-09-16 DIAGNOSIS — K3184 Gastroparesis: Secondary | ICD-10-CM

## 2023-09-16 DIAGNOSIS — K5902 Outlet dysfunction constipation: Secondary | ICD-10-CM

## 2023-09-16 DIAGNOSIS — K219 Gastro-esophageal reflux disease without esophagitis: Secondary | ICD-10-CM | POA: Diagnosis not present

## 2023-09-16 DIAGNOSIS — K224 Dyskinesia of esophagus: Secondary | ICD-10-CM

## 2023-09-16 DIAGNOSIS — R1310 Dysphagia, unspecified: Secondary | ICD-10-CM | POA: Diagnosis not present

## 2023-09-16 DIAGNOSIS — K582 Mixed irritable bowel syndrome: Secondary | ICD-10-CM | POA: Diagnosis not present

## 2023-09-16 DIAGNOSIS — R159 Full incontinence of feces: Secondary | ICD-10-CM

## 2023-09-16 NOTE — Telephone Encounter (Signed)
 PT CALLED BACK. She is scheduled for 10/12/23. Aware will send instructions to her and call back with pre-op appointment.

## 2023-09-16 NOTE — Patient Instructions (Signed)
 I am going to give you samples of Trulance to take for your chronic constipation.  Do not take lubiprostone with this medication.  I would not throw it away however just in case the Trulance does not work as well.  Let me know in a week or so if symptoms improved and I can send in formal prescription. We will schedule you for upper endoscopy with Dr. Levon Hedger for dilation of your esophagus to see if this helps with your swallowing.  Follow-up with me after upper endoscopy.  It is always a pleasure seeing you.  Dr. Marletta Lor

## 2023-09-16 NOTE — Progress Notes (Signed)
 Referring Provider: Benita Stabile, MD Primary Care Physician:  Benita Stabile, MD Primary GI:  Dr. Marletta Lor  Chief Complaint  Patient presents with   Dysphagia    Food gets hung up, also has issues with bowels.     HPI:   Chloe Golden is a 78 y.o. female who presents to clinic today for follow-up visit.    IBS, mixed with constipation and diarrhea:  Underwent colonoscopy 10/20/2021 with multiple tubular adenomas removed, random colon biopsies negative for microscopic colitis.  Recommended 3-year recall  She has had issues of incontinence in the past.  She underwent anorectal manometry in 2014 at [facility].  This revealed pelvic floor dyssynergia.  Was recommended she go to physical therapy for this.  Previously seen by urology as well who again recommended pelvic floor PT.    Status post cholecystectomy.  Duodenal biopsies in the past negative for celiac disease.  Previously on Linzess which caused excess diarrhea.  Now on Amitiza 8 mcg twice daily.  Initially this helped, now having breakthrough symptoms of constipation.  Chronic GERD and dysphagia: Dysphagia chronic, not improved status post esophageal balloon dilation in 2023.  Does note that she did have improvement with bougie dilation back in 2018.  Esophageal dysmotility seen on previous esophagram.  Has been on Pantoprazole, Dexilant, now on Rabeprazole.  GERD relatively well-controlled.      Past Medical History:  Diagnosis Date   Acid reflux    Arthritis    Degenerative arthritis of cervical spine    Diabetes mellitus without complication (HCC)    Fibromyalgia    Functional gastrointestinal disorder    GERD (gastroesophageal reflux disease)    Glaucoma    Hiatal hernia    Hypertension    Neuropathy    OAB (overactive bladder)    OSA on CPAP    stopped 6mos ago   PAD (peripheral artery disease) (HCC)    Mild by arterial Dopplers April 2019   Rheumatoid arthritis(714.0)    Venous reflux     Past Surgical  History:  Procedure Laterality Date   ABDOMINAL HYSTERECTOMY     BACTERIAL OVERGROWTH TEST N/A 11/09/2012   Procedure: BACTERIAL OVERGROWTH TEST;  Surgeon: West Bali, MD;  [facility];  Service: Endoscopy;  Laterality: N/A;  7:30   BALLOON DILATION N/A 07/07/2021   Procedure: BALLOON DILATION;  Surgeon: Lanelle Bal, DO;  [facility];  Service: Endoscopy;  Laterality: N/A;   BIOPSY N/A 10/28/2012   Procedure: BIOPSY;  Surgeon: West Bali, MD;  [facility];  Service: Endoscopy;  Laterality: N/A;  DUODENAL BIOPSY   BIOPSY  07/07/2021   Procedure: BIOPSY;  Surgeon: Lanelle Bal, DO;  [facility];  Service: Endoscopy;;   BIOPSY  10/20/2021   Procedure: BIOPSY;  Surgeon: Lanelle Bal, DO;  [facility];  Service: Endoscopy;;   BLADDER SURGERY     CARPAL TUNNEL RELEASE Bilateral    CARPOMETACARPEL SUSPENSION PLASTY Right 06/30/2022   Procedure: RIGHT THUMB TRAPEZIECTOMY AND SUSPENSIONPLASTY;  Surgeon: Betha Loa, MD;  Location: [facility];  Service: Orthopedics;  Laterality: Right;  90 MIN AUX BLOCK   CHOLECYSTECTOMY     COLONOSCOPY WITH ESOPHAGOGASTRODUODENOSCOPY (EGD) N/A 10/28/2012   WUJ:[id number] diverticulosis in the descending colon and sigmoid colon/Small internal hemorrhoids   COLONOSCOPY WITH PROPOFOL Left 10/20/2021   Procedure: COLONOSCOPY WITH PROPOFOL;  Surgeon: Lanelle Bal, DO;  [facility];  Service: Endoscopy;  Laterality: Left;  7:30am   ESOPHAGOGASTRODUODENOSCOPY N/A 11/27/2016   Procedure: ESOPHAGOGASTRODUODENOSCOPY (EGD);  Surgeon: West Bali, MD;  [facility];  Service: Endoscopy;  Laterality: N/A;  830    ESOPHAGOGASTRODUODENOSCOPY (EGD) WITH ESOPHAGEAL DILATION N/A 10/28/2012   [id number] CERVIAL WEB, S/P DILATION/MILD Non-erosive gastritis & DUODENITIS   ESOPHAGOGASTRODUODENOSCOPY (EGD) WITH PROPOFOL N/A 07/07/2021   gastritis s/p biopsy,  normal duodenum. dilation.  Negative H.pylori.   POLYPECTOMY  10/20/2021   Procedure: POLYPECTOMY;  Surgeon: Lanelle Bal, DO;  [facility];  Service: Endoscopy;;   SAVORY DILATION N/A 11/27/2016   Procedure: SAVORY DILATION;  Surgeon: West Bali, MD;  [facility];  Service: Endoscopy;  Laterality: N/A;   TOTAL KNEE ARTHROPLASTY Right     Current Outpatient Medications  Medication Sig Dispense Refill   ACCU-CHEK GUIDE test strip See admin instructions.     ALPRAZolam (XANAX) 0.5 MG tablet Take 0.5-1 mg by mouth See admin instructions. 0.5 mg in the morning, 1 mg at bedtime     amitriptyline (ELAVIL) 25 MG tablet Take 1 tablet (25 mg total) by mouth at bedtime. Take one tablet by mouth with dinner daily. 90 tablet 3   amLODipine (NORVASC) 5 MG tablet Take 5 mg by mouth daily.     baclofen (LIORESAL) 10 MG tablet Take 10 mg by mouth 2 (two) times daily.     cetirizine (ZYRTEC) 10 MG tablet Take 10 mg by mouth daily.     Coenzyme Q10 (COQ-10) 100 MG CAPS Take 100 mg by mouth daily at 6 (six) AM.     conjugated estrogens (PREMARIN) vaginal cream Place 1 applicator vaginally See admin instructions. Twice a week as directed     furosemide (LASIX) 20 MG tablet Take 20 mg by mouth daily.     GEMTESA 75 MG TABS Take 75 mg by mouth daily.     hydroxychloroquine (PLAQUENIL) 200 MG tablet Take 200 mg by mouth daily.     JANUVIA 100 MG tablet Take 100 mg by mouth daily.     JARDIANCE 10 MG TABS tablet Take 10 mg by mouth daily.     leflunomide (ARAVA) 10 MG tablet Take 10 mg by mouth daily.     losartan-hydrochlorothiazide (HYZAAR) 100-12.5 MG tablet Take 1 tablet by mouth daily.     lubiprostone (AMITIZA) 8 MCG capsule TAKE ONE CAPSULE BY MOUTH TWICE DAILY WITH A MEAL 60 capsule 3   memantine (NAMENDA) 5 MG tablet Take 2 tablets by mouth daily.     Multiple Vitamins-Minerals (MULTIVITAMIN WITH MINERALS) tablet Take 1 tablet by mouth daily.     Omega-3 Fatty Acids  (FISH OIL) 1000 MG CAPS Take 1,000 mg by mouth daily.     propranolol (INDERAL) 10 MG tablet Take 10 mg by mouth daily.     RABEprazole (ACIPHEX) 20 MG tablet TAKE ONE TABLET BY MOUTH TWICE DAILY 60 tablet 0   rOPINIRole (REQUIP) 0.5 MG tablet Take 1.5 mg by mouth at bedtime.     simvastatin (ZOCOR) 20 MG tablet Take 20 mg by mouth every Monday, Wednesday, and Friday.     traZODone (DESYREL) 50 MG tablet Take 50 mg by mouth at bedtime.     vortioxetine HBr (TRINTELLIX) 10 MG TABS tablet Take 10 mg by mouth daily.     No current facility-administered medications for this visit.    Allergies as of 09/16/2023 - Review Complete 09/16/2023  Allergen Reaction Noted   Trazodone  Other (See Comments) 07/14/2022   Atorvastatin  09/16/2023   Codeine Other (See Comments) 09/26/2012   Dicyclomine  01/05/2013   Eszopiclone  09/16/2023   Ibuprofen Other (See Comments) 09/26/2012   Keflex [cephalexin] Nausea Only 12/04/2022   Paroxetine  09/16/2023   Sulfa antibiotics  11/24/2016   Valium [diazepam] Other (See Comments) 09/26/2012    Family History  Problem Relation Age of Onset   Lung cancer Father    Diabetes Mellitus II Father    Crohn's disease Cousin    Hypertension Mother    Cystic fibrosis Mother    Lung cancer Brother    Hypertension Brother    Lupus Niece    Colon cancer Neg Hx    Celiac disease Neg Hx    Gastric cancer Neg Hx    Esophageal cancer Neg Hx     Social History   Socioeconomic History   Marital status: Single    Spouse name: Not on file   Number of children: Not on file   Years of education: Not on file   Highest education level: Not on file  Occupational History   Not on file  Tobacco Use   Smoking status: Never    Passive exposure: Past   Smokeless tobacco: Never  Vaping Use   Vaping status: Never Used  Substance and Sexual Activity   Alcohol use: No   Drug use: No   Sexual activity: Yes    Birth control/protection: Surgical    Comment: hyst   Other Topics Concern   Not on file  Social History Narrative   1 living   2 deceased   Social Drivers of Corporate investment banker Strain: Not on file  Food Insecurity: Low Risk  (04/07/2023)   Received from [facility]   Hunger Vital Sign    Worried About Running Out of Food in the Last Year: Never true    Ran Out of Food in the Last Year: Never true  Transportation Needs: No Transportation Needs (04/07/2023)   Received from [facility]    In the past 12 months, has lack of reliable transportation kept you from medical appointments, meetings, work or from getting things needed for daily living? : No  Physical Activity: Not on file  Stress: Not on file  Social Connections: Not on file    Subjective: Review of Systems  Constitutional:  Negative for chills and fever.  HENT:  Negative for congestion and hearing loss.   Eyes:  Negative for blurred vision and double vision.  Respiratory:  Negative for cough and shortness of breath.   Cardiovascular:  Negative for chest pain and palpitations.  Gastrointestinal:  Positive for diarrhea. Negative for abdominal pain, blood in stool, constipation, heartburn, melena and vomiting.       Fecal incontinence  Genitourinary:  Negative for dysuria and urgency.  Musculoskeletal:  Negative for joint pain and myalgias.  Skin:  Negative for itching and rash.  Neurological:  Negative for dizziness and headaches.  Psychiatric/Behavioral:  Negative for depression. The patient is not nervous/anxious.      Objective: BP 136/68 (BP Location: Right Arm, Patient Position: Sitting, Cuff Size: Normal)   Pulse 93   Temp 98.1 F (36.7 C) (Oral)   Ht 5\' 4"  (1.626 m)   Wt 148 lb (67.1 kg)   SpO2 95%   BMI 25.40 kg/m  Physical Exam Constitutional:      Appearance: Normal appearance.  HENT:     Head: Normocephalic  and atraumatic.  Eyes:     Extraocular Movements: Extraocular movements intact.     Conjunctiva/sclera:  Conjunctivae normal.  Cardiovascular:     Rate and Rhythm: Normal rate and regular rhythm.  Pulmonary:     Effort: Pulmonary effort is normal.     Breath sounds: Normal breath sounds.  Abdominal:     General: Bowel sounds are normal.     Palpations: Abdomen is soft.  Musculoskeletal:        General: No swelling. Normal range of motion.     Cervical back: Normal range of motion and neck supple.  Skin:    General: Skin is warm and dry.     Coloration: Skin is not jaundiced.  Neurological:     General: No focal deficit present.     Mental Status: She is alert and oriented to person, place, and time.  Psychiatric:        Mood and Affect: Mood normal.        Behavior: Behavior normal.      Assessment: *Irritable bowel syndrome with constipation and diarrhea *Chronic GERD *Dysphagia-chronic *Gastroparesis  Plan: Patient's IBS not well-controlled on Amitiza 8 mcg twice daily.  Failed Linzess in the past due to discussed for diarrhea.  Will give samples of Trulance today.  Call with update in 1 week and I can send a formal prescription if improved.  Dysphagia chronic, not improved status post esophageal balloon dilation.  Interestingly she does say that bougie dilation in the past did help.  Discussed with Dr. Levon Hedger who would be willing to try upper endoscopy with bougie dilation.  Will schedule today.   The risks including infection, bleed, or perforation as well as benefits, limitations, alternatives and imponderables have been reviewed with the patient. Potential for esophageal dilation, biopsy, etc. have also been reviewed.  Questions have been answered. All parties agreeable.  Does likely have a component of esophageal dysmotility, may need to consider esophageal manometry if not improved with bougie dilation.  Patient to avoid tough textures. All meats should be chopped finely. Eat slowly, take small bites, chew thoroughly, and drink plenty of liquids throughout meals.    GERD well-controlled on rabeprazole twice daily.  Will continue.  Discussed gastroparesis in depth with patient today: 4-6 small meals daily Low fat diet Low fiber diet (avoid raw fruits and vegetables).   Follow-up 3-4 months  09/16/2023 11:53 AM   Disclaimer: This note was dictated with voice recognition software. Similar sounding words can inadvertently be transcribed and may not be corrected upon review.

## 2023-09-16 NOTE — Telephone Encounter (Signed)
 LMOVM to call back to schedule EGD/ED with Dr. Levon Hedger, ASA 3 per Dr. Marletta Lor Hold jardiance x 3 days prior, no januvia day of

## 2023-09-16 NOTE — H&P (View-Only) (Signed)
 Referring Provider: Benita Stabile, MD Primary Care Physician:  Benita Stabile, MD Primary GI:  Dr. Marletta Lor  Chief Complaint  Patient presents with   Dysphagia    Food gets hung up, also has issues with bowels.     HPI:   Chloe Golden is a 78 y.o. female who presents to clinic today for follow-up visit.    IBS, mixed with constipation and diarrhea:  Underwent colonoscopy 10/20/2021 with multiple tubular adenomas removed, random colon biopsies negative for microscopic colitis.  Recommended 3-year recall  She has had issues of incontinence in the past.  She underwent anorectal manometry in 2014 at [facility].  This revealed pelvic floor dyssynergia.  Was recommended she go to physical therapy for this.  Previously seen by urology as well who again recommended pelvic floor PT.    Status post cholecystectomy.  Duodenal biopsies in the past negative for celiac disease.  Previously on Linzess which caused excess diarrhea.  Now on Amitiza 8 mcg twice daily.  Initially this helped, now having breakthrough symptoms of constipation.  Chronic GERD and dysphagia: Dysphagia chronic, not improved status post esophageal balloon dilation in 2023.  Does note that she did have improvement with bougie dilation back in 2018.  Esophageal dysmotility seen on previous esophagram.  Has been on Pantoprazole, Dexilant, now on Rabeprazole.  GERD relatively well-controlled.      Past Medical History:  Diagnosis Date   Acid reflux    Arthritis    Degenerative arthritis of cervical spine    Diabetes mellitus without complication (HCC)    Fibromyalgia    Functional gastrointestinal disorder    GERD (gastroesophageal reflux disease)    Glaucoma    Hiatal hernia    Hypertension    Neuropathy    OAB (overactive bladder)    OSA on CPAP    stopped 6mos ago   PAD (peripheral artery disease) (HCC)    Mild by arterial Dopplers April 2019   Rheumatoid arthritis(714.0)    Venous reflux     Past Surgical  History:  Procedure Laterality Date   ABDOMINAL HYSTERECTOMY     BACTERIAL OVERGROWTH TEST N/A 11/09/2012   Procedure: BACTERIAL OVERGROWTH TEST;  Surgeon: West Bali, MD;  [facility]: [facility];  Service: Endoscopy;  Laterality: N/A;  7:30   BALLOON DILATION N/A 07/07/2021   Procedure: BALLOON DILATION;  Surgeon: Lanelle Bal, DO;  [facility]: [facility];  Service: Endoscopy;  Laterality: N/A;   BIOPSY N/A 10/28/2012   Procedure: BIOPSY;  Surgeon: West Bali, MD;  [facility]: [facility];  Service: Endoscopy;  Laterality: N/A;  DUODENAL BIOPSY   BIOPSY  07/07/2021   Procedure: BIOPSY;  Surgeon: Lanelle Bal, DO;  Location: [facility];  Service: Endoscopy;;   BIOPSY  10/20/2021   Procedure: BIOPSY;  Surgeon: Lanelle Bal, DO;  [facility];  Service: Endoscopy;;   BLADDER SURGERY     CARPAL TUNNEL RELEASE Bilateral    CARPOMETACARPEL SUSPENSION PLASTY Right 06/30/2022   Procedure: RIGHT THUMB TRAPEZIECTOMY AND SUSPENSIONPLASTY;  Surgeon: Betha Loa, MD;  Location: [facility];  Service: Orthopedics;  Laterality: Right;  90 MIN AUX BLOCK   CHOLECYSTECTOMY     COLONOSCOPY WITH ESOPHAGOGASTRODUODENOSCOPY (EGD) N/A 10/28/2012   [id number] diverticulosis in the descending colon and sigmoid colon/Small internal hemorrhoids   COLONOSCOPY WITH PROPOFOL Left 10/20/2021   Procedure: COLONOSCOPY WITH PROPOFOL;  Surgeon: Lanelle Bal, DO;  [facility]: [facility];  Service: Endoscopy;  Laterality: Left;  7:30am   ESOPHAGOGASTRODUODENOSCOPY N/A 11/27/2016   Procedure: ESOPHAGOGASTRODUODENOSCOPY (EGD);  Surgeon: West Bali, MD;  [facility]: [facility];  Service: Endoscopy;  Laterality: N/A;  830    ESOPHAGOGASTRODUODENOSCOPY (EGD) WITH ESOPHAGEAL DILATION N/A 10/28/2012   [id number] CERVIAL WEB, S/P DILATION/MILD Non-erosive gastritis & DUODENITIS   ESOPHAGOGASTRODUODENOSCOPY (EGD) WITH PROPOFOL N/A 07/07/2021   gastritis s/p biopsy,  normal duodenum. dilation.  Negative H.pylori.   POLYPECTOMY  10/20/2021   Procedure: POLYPECTOMY;  Surgeon: Lanelle Bal, DO;  [facility]: [facility];  Service: Endoscopy;;   SAVORY DILATION N/A 11/27/2016   Procedure: SAVORY DILATION;  Surgeon: West Bali, MD;  [facility]: [facility];  Service: Endoscopy;  Laterality: N/A;   TOTAL KNEE ARTHROPLASTY Right     Current Outpatient Medications  Medication Sig Dispense Refill   ACCU-CHEK GUIDE test strip See admin instructions.     ALPRAZolam (XANAX) 0.5 MG tablet Take 0.5-1 mg by mouth See admin instructions. 0.5 mg in the morning, 1 mg at bedtime     amitriptyline (ELAVIL) 25 MG tablet Take 1 tablet (25 mg total) by mouth at bedtime. Take one tablet by mouth with dinner daily. 90 tablet 3   amLODipine (NORVASC) 5 MG tablet Take 5 mg by mouth daily.     baclofen (LIORESAL) 10 MG tablet Take 10 mg by mouth 2 (two) times daily.     cetirizine (ZYRTEC) 10 MG tablet Take 10 mg by mouth daily.     Coenzyme Q10 (COQ-10) 100 MG CAPS Take 100 mg by mouth daily at 6 (six) AM.     conjugated estrogens (PREMARIN) vaginal cream Place 1 applicator vaginally See admin instructions. Twice a week as directed     furosemide (LASIX) 20 MG tablet Take 20 mg by mouth daily.     GEMTESA 75 MG TABS Take 75 mg by mouth daily.     hydroxychloroquine (PLAQUENIL) 200 MG tablet Take 200 mg by mouth daily.     JANUVIA 100 MG tablet Take 100 mg by mouth daily.     JARDIANCE 10 MG TABS tablet Take 10 mg by mouth daily.     leflunomide (ARAVA) 10 MG tablet Take 10 mg by mouth daily.     losartan-hydrochlorothiazide (HYZAAR) 100-12.5 MG tablet Take 1 tablet by mouth daily.     lubiprostone (AMITIZA) 8 MCG capsule TAKE ONE CAPSULE BY MOUTH TWICE DAILY WITH A MEAL 60 capsule 3   memantine (NAMENDA) 5 MG tablet Take 2 tablets by mouth daily.     Multiple Vitamins-Minerals (MULTIVITAMIN WITH MINERALS) tablet Take 1 tablet by mouth daily.     Omega-3 Fatty Acids  (FISH OIL) 1000 MG CAPS Take 1,000 mg by mouth daily.     propranolol (INDERAL) 10 MG tablet Take 10 mg by mouth daily.     RABEprazole (ACIPHEX) 20 MG tablet TAKE ONE TABLET BY MOUTH TWICE DAILY 60 tablet 0   rOPINIRole (REQUIP) 0.5 MG tablet Take 1.5 mg by mouth at bedtime.     simvastatin (ZOCOR) 20 MG tablet Take 20 mg by mouth every Monday, Wednesday, and Friday.     traZODone (DESYREL) 50 MG tablet Take 50 mg by mouth at bedtime.     vortioxetine HBr (TRINTELLIX) 10 MG TABS tablet Take 10 mg by mouth daily.     No current facility-administered medications for this visit.    Allergies as of 09/16/2023 - Review Complete 09/16/2023  Allergen Reaction Noted   Trazodone  Other (See Comments) 07/14/2022   Atorvastatin  09/16/2023   Codeine Other (See Comments) 09/26/2012   Dicyclomine  01/05/2013   Eszopiclone  09/16/2023   Ibuprofen Other (See Comments) 09/26/2012   Keflex [cephalexin] Nausea Only 12/04/2022   Paroxetine  09/16/2023   Sulfa antibiotics  11/24/2016   Valium [diazepam] Other (See Comments) 09/26/2012    Family History  Problem Relation Age of Onset   Lung cancer Father    Diabetes Mellitus II Father    Crohn's disease Cousin    Hypertension Mother    Cystic fibrosis Mother    Lung cancer Brother    Hypertension Brother    Lupus Niece    Colon cancer Neg Hx    Celiac disease Neg Hx    Gastric cancer Neg Hx    Esophageal cancer Neg Hx     Social History   Socioeconomic History   Marital status: Single    Spouse name: Not on file   Number of children: Not on file   Years of education: Not on file   Highest education level: Not on file  Occupational History   Not on file  Tobacco Use   Smoking status: Never    Passive exposure: Past   Smokeless tobacco: Never  Vaping Use   Vaping status: Never Used  Substance and Sexual Activity   Alcohol use: No   Drug use: No   Sexual activity: Yes    Birth control/protection: Surgical    Comment: hyst   Other Topics Concern   Not on file  Social History Narrative   1 living   2 deceased   Social Drivers of Corporate investment banker Strain: Not on file  Food Insecurity: Low Risk  (04/07/2023)   Received from [facility]   Hunger Vital Sign    Worried About Running Out of Food in the Last Year: Never true    Ran Out of Food in the Last Year: Never true  Transportation Needs: No Transportation Needs (04/07/2023)   Received from [facility]    In the past 12 months, has lack of reliable transportation kept you from medical appointments, meetings, work or from getting things needed for daily living? : No  Physical Activity: Not on file  Stress: Not on file  Social Connections: Not on file    Subjective: Review of Systems  Constitutional:  Negative for chills and fever.  HENT:  Negative for congestion and hearing loss.   Eyes:  Negative for blurred vision and double vision.  Respiratory:  Negative for cough and shortness of breath.   Cardiovascular:  Negative for chest pain and palpitations.  Gastrointestinal:  Positive for diarrhea. Negative for abdominal pain, blood in stool, constipation, heartburn, melena and vomiting.       Fecal incontinence  Genitourinary:  Negative for dysuria and urgency.  Musculoskeletal:  Negative for joint pain and myalgias.  Skin:  Negative for itching and rash.  Neurological:  Negative for dizziness and headaches.  Psychiatric/Behavioral:  Negative for depression. The patient is not nervous/anxious.      Objective: BP 136/68 (BP Location: Right Arm, Patient Position: Sitting, Cuff Size: Normal)   Pulse 93   Temp 98.1 F (36.7 C) (Oral)   Ht 5\' 4"  (1.626 m)   Wt 148 lb (67.1 kg)   SpO2 95%   BMI 25.40 kg/m  Physical Exam Constitutional:      Appearance: Normal appearance.  HENT:     Head: Normocephalic  and atraumatic.  Eyes:     Extraocular Movements: Extraocular movements intact.     Conjunctiva/sclera:  Conjunctivae normal.  Cardiovascular:     Rate and Rhythm: Normal rate and regular rhythm.  Pulmonary:     Effort: Pulmonary effort is normal.     Breath sounds: Normal breath sounds.  Abdominal:     General: Bowel sounds are normal.     Palpations: Abdomen is soft.  Musculoskeletal:        General: No swelling. Normal range of motion.     Cervical back: Normal range of motion and neck supple.  Skin:    General: Skin is warm and dry.     Coloration: Skin is not jaundiced.  Neurological:     General: No focal deficit present.     Mental Status: She is alert and oriented to person, place, and time.  Psychiatric:        Mood and Affect: Mood normal.        Behavior: Behavior normal.      Assessment: *Irritable bowel syndrome with constipation and diarrhea *Chronic GERD *Dysphagia-chronic *Gastroparesis  Plan: Patient's IBS not well-controlled on Amitiza 8 mcg twice daily.  Failed Linzess in the past due to discussed for diarrhea.  Will give samples of Trulance today.  Call with update in 1 week and I can send a formal prescription if improved.  Dysphagia chronic, not improved status post esophageal balloon dilation.  Interestingly she does say that bougie dilation in the past did help.  Discussed with Dr. Levon Hedger who would be willing to try upper endoscopy with bougie dilation.  Will schedule today.   The risks including infection, bleed, or perforation as well as benefits, limitations, alternatives and imponderables have been reviewed with the patient. Potential for esophageal dilation, biopsy, etc. have also been reviewed.  Questions have been answered. All parties agreeable.  Does likely have a component of esophageal dysmotility, may need to consider esophageal manometry if not improved with bougie dilation.  Patient to avoid tough textures. All meats should be chopped finely. Eat slowly, take small bites, chew thoroughly, and drink plenty of liquids throughout meals.    GERD well-controlled on rabeprazole twice daily.  Will continue.  Discussed gastroparesis in depth with patient today: 4-6 small meals daily Low fat diet Low fiber diet (avoid raw fruits and vegetables).   Follow-up 3-4 months  09/16/2023 11:53 AM   Disclaimer: This note was dictated with voice recognition software. Similar sounding words can inadvertently be transcribed and may not be corrected upon review.

## 2023-09-16 NOTE — Telephone Encounter (Signed)
 Per [facility] website for PA: Notification or Prior Authorization is not required for the requested services You are not required to submit a notification/prior authorization based on the information provided. If you have general questions about the prior authorization requirements, visit [email or web address] > Clinician Resources > Advance and Admission Notification Requirements. The number above acknowledges your notification. Please write this reference number down for future reference. If you would like to request an organization determination, please call us at [phone]. Decision ID #: [id number]

## 2023-09-17 NOTE — Telephone Encounter (Signed)
 Pt informed of pre-op date on Thursday 10/07/23, arrive at 12:45 pm. Verbalized understanding

## 2023-09-28 DIAGNOSIS — N3000 Acute cystitis without hematuria: Secondary | ICD-10-CM | POA: Diagnosis not present

## 2023-09-28 DIAGNOSIS — R350 Frequency of micturition: Secondary | ICD-10-CM | POA: Diagnosis not present

## 2023-10-05 NOTE — Patient Instructions (Signed)
 Chloe Golden  10/05/2023     @PREFPERIOPPHARMACY @   Your procedure is scheduled on 10/12/2023.   Report to [facility] at 6:45 AM.   Call this number if you have problems the morning of surgery:  [phone]  If you experience any cold or flu symptoms such as cough, fever, chills, shortness of breath, etc. between now and your scheduled surgery, please notify us  at the above number.   Remember:   Please Follow the diet instructions given to you by Dr Sammi Crick.   You may drink clear liquids until 3:30 AM .    Clear liquids                 Water, Juice (No red color; non-citric and without pulp; diabetics please choose diet or no sugar options), Carbonated beverages (diabetics please choose diet or no sugar options), Clear Tea (No creamer, milk, or cream, including half & half and powdered creamer), Black Coffee Only (No creamer, milk or cream, including half & half and powdered creamer), Plain Jell-O Only (No red color; diabetics please choose no sugar options), Clear Sports drink (No red color; diabetics please choose diet or no sugar options), and Plain Popsicles Only (No red color; diabetics please choose no sugar options)    Take these medicines the morning of surgery with A SIP OF WATER : Xanax, Amlodipine, Baclofen, Namenda, Inderal and Aciphex.    Jardiance last dose should be on 10/08/2023.   Do not take any diabetic meds the am of the procedure.     Do not wear jewelry, make-up or nail polish, including gel polish,  artificial nails, or any other type of covering on natural nails (fingers and  toes).  Do not wear lotions, powders, or perfumes, or deodorant.  Do not shave 48 hours prior to surgery.  Men may shave face and neck.  Do not bring valuables to the hospital.  [facility] is not responsible for any belongings or valuables.  Contacts, dentures or bridgework may not be worn into surgery.  Leave your suitcase in the car.  After surgery it may be brought to your  room.  For patients admitted to the hospital, discharge time will be determined by your treatment team.  Patients discharged the day of surgery will not be allowed to drive home.   Name and phone number of your driver:   Family  Special instructions:  N/A  Please read over the following fact sheets that you were given.  Care and Recovery After Surgery   Upper Endoscopy, Adult Upper endoscopy is a procedure to look inside the upper GI (gastrointestinal) tract. The upper GI tract is made up of: The esophagus. This is the part of the body that moves food from your mouth to your stomach. The stomach. The duodenum. This is the first part of your small intestine. This procedure is also called esophagogastroduodenoscopy (EGD) or gastroscopy. In this procedure, your health care provider passes a thin, flexible tube (endoscope) through your mouth and down your esophagus into your stomach and into your duodenum. A small camera is attached to the end of the tube. Images from the camera appear on a monitor in the exam room. During this procedure, your health care provider may also remove a small piece of tissue to be sent to a lab and examined under a microscope (biopsy). Your health care provider may do an upper endoscopy to diagnose cancers of the upper GI tract. You may also have this procedure  to find the cause of other conditions, such as: Stomach pain. Heartburn. Pain or problems when swallowing. Nausea and vomiting. Stomach bleeding. Stomach ulcers. Tell a health care provider about: Any allergies you have. All medicines you are taking, including vitamins, herbs, eye drops, creams, and over-the-counter medicines. Any problems you or family members have had with anesthetic medicines. Any bleeding problems you have. Any surgeries you have had. Any medical conditions you have. Whether you are pregnant or may be pregnant. What are the risks? Your healthcare provider will talk with you  about risks. These may include: Infection. Bleeding. Allergic reactions to medicines. A tear or hole (perforation) in the esophagus, stomach, or duodenum. What happens before the procedure? When to stop eating and drinking Follow instructions from your health care provider about what you may eat and drink. These may include: 8 hours before your procedure Stop eating most foods. Do not eat meat, fried foods, or fatty foods. Eat only light foods, such as toast or crackers. All liquids are okay except energy drinks and alcohol. 6 hours before your procedure Stop eating. Drink only clear liquids, such as water, clear fruit juice, black coffee, plain tea, and sports drinks. Do not drink energy drinks or alcohol. 2 hours before your procedure Stop drinking all liquids. You may be allowed to take medicines with small sips of water. If you do not follow your health care provider's instructions, your procedure may be delayed or canceled. Medicines Ask your health care provider about: Changing or stopping your regular medicines. This is especially important if you are taking diabetes medicines or blood thinners. Taking medicines such as aspirin and ibuprofen. These medicines can thin your blood. Do not take these medicines unless your health care provider tells you to take them. Taking over-the-counter medicines, vitamins, herbs, and supplements. General instructions If you will be going home right after the procedure, plan to have a responsible adult: Take you home from the hospital or clinic. You will not be allowed to drive. Care for you for the time you are told. What happens during the procedure?  An IV will be inserted into one of your veins. You may be given one or more of the following: A medicine to help you relax (sedative). A medicine to numb the throat (local anesthetic). You will lie on your left side on an exam table. Your health care provider will pass the endoscope through  your mouth and down your esophagus. Your health care provider will use the scope to check the inside of your esophagus, stomach, and duodenum. Biopsies may be taken. The endoscope will be removed. The procedure may vary among health care providers and hospitals. What happens after the procedure? Your blood pressure, heart rate, breathing rate, and blood oxygen level will be monitored until you leave the hospital or clinic. When your throat is no longer numb, you may be given some fluids to drink. If you were given a sedative during the procedure, it can affect you for several hours. Do not drive or operate machinery until your health care provider says that it is safe. It is up to you to get the results of your procedure. Ask your health care provider, or the department that is doing the procedure, when your results will be ready. Contact a health care provider if you: Have a sore throat that lasts longer than 1 day. Have a fever. Get help right away if you: Vomit blood or your vomit looks like coffee grounds. Have bloody, black, or  tarry stools. Have a very bad sore throat or you cannot swallow. Have difficulty breathing or very bad pain in your chest or abdomen. These symptoms may be an emergency. Get help right away. Call 911. Do not wait to see if the symptoms will go away. Do not drive yourself to the hospital. Summary Upper endoscopy is a procedure to look inside the upper GI tract. During the procedure, an IV will be inserted into one of your veins. You may be given a medicine to help you relax. The endoscope will be passed through your mouth and down your esophagus. Follow instructions from your health care provider about what you can eat and drink. This information is not intended to replace advice given to you by your health care provider. Make sure you discuss any questions you have with your health care provider. Document Revised: 09/17/2021 Document Reviewed:  09/17/2021 Elsevier Patient Education  2024 [employer]  Monitored Anesthesia Care Anesthesia refers to the techniques, procedures, and medicines that help a person stay safe and comfortable during surgery. Monitored anesthesia care, or sedation, is one type of anesthesia. You may have sedation if you do not need to be asleep for your procedure. Procedures that use sedation may include: Surgery to remove cataracts from your eyes. A dental procedure. A biopsy. This is when a tissue sample is removed and looked at under a microscope. You will be watched closely during your procedure. Your level of sedation or type of anesthesia may be changed to fit your needs. Tell a health care provider about: Any allergies you have. All medicines you are taking, including vitamins, herbs, eye drops, creams, and over-the-counter medicines. Any problems you or family members have had with anesthesia. Any bleeding problems you have. Any surgeries you have had. Any medical conditions or illnesses you have. This includes sleep apnea, cough, fever, or the flu. Whether you are pregnant or may be pregnant. Whether you use cigarettes, alcohol, or drugs. Any use of steroids, whether by mouth or as a cream. What are the risks? Your health care provider will talk with you about risks. These may include: Getting too much medicine (oversedation). Nausea. Allergic reactions to medicines. Trouble breathing. If this happens, a breathing tube may be used to help you breathe. It will be removed when you are awake and breathing on your own. Heart trouble. Lung trouble. Confusion that gets better with time (emergence delirium). What happens before the procedure? When to stop eating and drinking Follow instructions from your health care provider about what you may eat and drink. These may include: 8 hours before your procedure Stop eating most foods. Do not eat meat, fried foods, or fatty foods. Eat only light foods,  such as toast or crackers. All liquids are okay except energy drinks and alcohol. 6 hours before your procedure Stop eating. Drink only clear liquids, such as water, clear fruit juice, black coffee, plain tea, and sports drinks. Do not drink energy drinks or alcohol. 2 hours before your procedure Stop drinking all liquids. You may be allowed to take medicines with small sips of water. If you do not follow your health care provider's instructions, your procedure may be delayed or canceled. Medicines Ask your health care provider about: Changing or stopping your regular medicines. These include any diabetes medicines or blood thinners you take. Taking medicines such as aspirin and ibuprofen. These medicines can thin your blood. Do not take them unless your health care provider tells you to. Taking over-the-counter medicines, vitamins,  herbs, and supplements. Testing You may have an exam or testing. You may have a blood or urine sample taken. General instructions Do not use any products that contain nicotine or tobacco for at least 4 weeks before the procedure. These products include cigarettes, chewing tobacco, and vaping devices, such as e-cigarettes. If you need help quitting, ask your health care provider. If you will be going home right after the procedure, plan to have a responsible adult: Take you home from the hospital or clinic. You will not be allowed to drive. Care for you for the time you are told. What happens during the procedure?  Your blood pressure, heart rate, breathing, level of pain, and blood oxygen level will be monitored. An IV will be inserted into one of your veins. You may be given: A sedative. This helps you relax. Anesthesia. This will: Numb certain areas of your body. Make you fall asleep for surgery. You will be given medicines as needed to keep you comfortable. The more medicine you are given, the deeper your level of sedation will be. Your level of  sedation may be changed to fit your needs. There are three levels of sedation: Mild sedation. At this level, you may feel awake and relaxed. You will be able to follow directions. Moderate sedation. At this level, you will be sleepy. You may not remember the procedure. Deep sedation. At this level, you will be asleep. You will not remember the procedure. How you get the medicines will depend on your age and the procedure. They may be given as: A pill. This may be taken by mouth (orally) or inserted into the rectum. An injection. This may be into a vein or muscle. A spray through the nose. After your procedure is over, the medicine will be stopped. The procedure may vary among health care providers and hospitals. What happens after the procedure? Your blood pressure, heart rate, breathing rate, and blood oxygen level will be monitored until you leave the hospital or clinic. You may feel sleepy, clumsy, or nauseous. You may not remember what happened during or after the procedure. Sedation can affect you for several hours. Do not drive or use machinery until your health care provider says that it is safe. This information is not intended to replace advice given to you by your health care provider. Make sure you discuss any questions you have with your health care provider. Document Revised: 11/03/2021 Document Reviewed: 11/03/2021 Elsevier Patient Education  2024 [employer].

## 2023-10-07 ENCOUNTER — Encounter (HOSPITAL_COMMUNITY)
Admission: RE | Admit: 2023-10-07 | Discharge: 2023-10-07 | Disposition: A | Discharge status: Home / Self Care | Source: Ambulatory Visit | Attending: Gastroenterology | Admitting: Gastroenterology

## 2023-10-07 ENCOUNTER — Encounter (HOSPITAL_COMMUNITY): Payer: Self-pay

## 2023-10-07 VITALS — BP 136/68 | HR 93 | Temp 98.1°F | Resp 18 | Ht 64.0 in | Wt 148.0 lb

## 2023-10-07 DIAGNOSIS — E1165 Type 2 diabetes mellitus with hyperglycemia: Secondary | ICD-10-CM | POA: Insufficient documentation

## 2023-10-07 DIAGNOSIS — Z01818 Encounter for other preprocedural examination: Secondary | ICD-10-CM | POA: Insufficient documentation

## 2023-10-07 DIAGNOSIS — I10 Essential (primary) hypertension: Secondary | ICD-10-CM

## 2023-10-07 LAB — BASIC METABOLIC PANEL WITH GFR
Anion gap: 11 (ref 5–15)
BUN: 16 mg/dL (ref 8–23)
CO2: 28 mmol/L (ref 22–32)
Calcium: 9 mg/dL (ref 8.9–10.3)
Chloride: 99 mmol/L (ref 98–111)
Creatinine, Ser: 0.9 mg/dL (ref 0.44–1.00)
GFR, Estimated: >60 mL/min (ref >60)
Glucose, Bld: 84 mg/dL (ref 70–99)
Potassium: 3.6 mmol/L (ref 3.5–5.1)
Sodium: 138 mmol/L (ref 135–145)

## 2023-10-11 NOTE — Telephone Encounter (Signed)
 Pt's Amitiza  was refilled on 08/25/2023 with 3 additional refills. Pt should have some left

## 2023-10-12 ENCOUNTER — Encounter (HOSPITAL_COMMUNITY)
Admission: RE | Disposition: A | Discharge status: Home / Self Care | Payer: Self-pay | Source: Home / Self Care | Attending: Gastroenterology

## 2023-10-12 ENCOUNTER — Ambulatory Visit (HOSPITAL_COMMUNITY)

## 2023-10-12 ENCOUNTER — Ambulatory Visit (HOSPITAL_COMMUNITY)
Admission: RE | Admit: 2023-10-12 | Discharge: 2023-10-12 | Disposition: A | Discharge status: Home / Self Care | Attending: Gastroenterology | Admitting: Gastroenterology

## 2023-10-12 ENCOUNTER — Encounter (HOSPITAL_COMMUNITY): Payer: Self-pay | Admitting: Gastroenterology

## 2023-10-12 DIAGNOSIS — K449 Diaphragmatic hernia without obstruction or gangrene: Secondary | ICD-10-CM | POA: Diagnosis not present

## 2023-10-12 DIAGNOSIS — F418 Other specified anxiety disorders: Secondary | ICD-10-CM

## 2023-10-12 DIAGNOSIS — Z9049 Acquired absence of other specified parts of digestive tract: Secondary | ICD-10-CM | POA: Insufficient documentation

## 2023-10-12 DIAGNOSIS — R1310 Dysphagia, unspecified: Secondary | ICD-10-CM | POA: Insufficient documentation

## 2023-10-12 DIAGNOSIS — Z7984 Long term (current) use of oral hypoglycemic drugs: Secondary | ICD-10-CM | POA: Diagnosis not present

## 2023-10-12 DIAGNOSIS — Z79899 Other long term (current) drug therapy: Secondary | ICD-10-CM | POA: Insufficient documentation

## 2023-10-12 DIAGNOSIS — I10 Essential (primary) hypertension: Secondary | ICD-10-CM | POA: Diagnosis not present

## 2023-10-12 DIAGNOSIS — F419 Anxiety disorder, unspecified: Secondary | ICD-10-CM | POA: Insufficient documentation

## 2023-10-12 DIAGNOSIS — K219 Gastro-esophageal reflux disease without esophagitis: Secondary | ICD-10-CM | POA: Insufficient documentation

## 2023-10-12 DIAGNOSIS — E1151 Type 2 diabetes mellitus with diabetic peripheral angiopathy without gangrene: Secondary | ICD-10-CM | POA: Insufficient documentation

## 2023-10-12 DIAGNOSIS — K582 Mixed irritable bowel syndrome: Secondary | ICD-10-CM | POA: Insufficient documentation

## 2023-10-12 DIAGNOSIS — G4730 Sleep apnea, unspecified: Secondary | ICD-10-CM | POA: Diagnosis not present

## 2023-10-12 DIAGNOSIS — E119 Type 2 diabetes mellitus without complications: Secondary | ICD-10-CM

## 2023-10-12 HISTORY — PX: ESOPHAGEAL DILATION: SHX303

## 2023-10-12 HISTORY — PX: ESOPHAGOGASTRODUODENOSCOPY: SHX5428

## 2023-10-12 LAB — GLUCOSE, CAPILLARY: Glucose-Capillary: 88 mg/dL (ref 70–99)

## 2023-10-12 SURGERY — EGD (ESOPHAGOGASTRODUODENOSCOPY)
Anesthesia: General

## 2023-10-12 MED ORDER — PROPOFOL 10 MG/ML IV BOLUS
INTRAVENOUS | Status: DC | PRN
  Administered 2023-10-12: 150 ug/kg/min via INTRAVENOUS
  Administered 2023-10-12: 40 mg via INTRAVENOUS
  Administered 2023-10-12: 60 mg via INTRAVENOUS

## 2023-10-12 MED ORDER — PROPOFOL 1000 MG/100ML IV EMUL
INTRAVENOUS | Status: AC
  Filled 2023-10-12: qty 200

## 2023-10-12 MED ORDER — LIDOCAINE HCL (PF) 2 % IJ SOLN
INTRAMUSCULAR | Status: AC
Start: 2023-10-12 — End: ?
  Filled 2023-10-12: qty 15

## 2023-10-12 MED ORDER — LIDOCAINE HCL (PF) 2 % IJ SOLN
INTRAMUSCULAR | Status: AC
  Filled 2023-10-12: qty 20

## 2023-10-12 MED ORDER — LIDOCAINE HCL (PF) 2 % IJ SOLN
INTRAMUSCULAR | Status: DC | PRN
  Administered 2023-10-12: 60 mg via INTRADERMAL

## 2023-10-12 MED ORDER — PROPOFOL 500 MG/50ML IV EMUL
INTRAVENOUS | Status: AC
  Filled 2023-10-12: qty 100

## 2023-10-12 MED ORDER — LACTATED RINGERS IV SOLN: INTRAVENOUS | Status: DC | PRN

## 2023-10-12 MED ORDER — PROPOFOL 500 MG/50ML IV EMUL
INTRAVENOUS | Status: AC
  Filled 2023-10-12: qty 50

## 2023-10-12 NOTE — Anesthesia Preprocedure Evaluation (Signed)
 Anesthesia Evaluation  Patient identified by MRN, date of birth, ID band Patient awake    Reviewed: Allergy & Precautions, H&P , NPO status , Patient's Chart, lab work & pertinent test results, reviewed documented beta blocker date and time   Airway Mallampati: II  TM Distance: >3 FB Neck ROM: full    Dental no notable dental hx.    Pulmonary neg pulmonary ROS, sleep apnea    Pulmonary exam normal breath sounds clear to auscultation       Cardiovascular Exercise Tolerance: Good hypertension, + Peripheral Vascular Disease  negative cardio ROS  Rhythm:regular Rate:Normal     Neuro/Psych  PSYCHIATRIC DISORDERS Anxiety Depression   Dementia  Neuromuscular disease negative neurological ROS  negative psych ROS   GI/Hepatic negative GI ROS, Neg liver ROS, hiatal hernia,GERD  ,,  Endo/Other  negative endocrine ROSdiabetes    Renal/GU negative Renal ROS  negative genitourinary   Musculoskeletal   Abdominal   Peds  Hematology negative hematology ROS (+)   Anesthesia Other Findings   Reproductive/Obstetrics negative OB ROS                             Anesthesia Physical Anesthesia Plan  ASA: 3  Anesthesia Plan: General   Post-op Pain Management:    Induction:   PONV Risk Score and Plan: Propofol  infusion  Airway Management Planned:   Additional Equipment:   Intra-op Plan:   Post-operative Plan:   Informed Consent: I have reviewed the patients History and Physical, chart, labs and discussed the procedure including the risks, benefits and alternatives for the proposed anesthesia with the patient or authorized representative who has indicated his/her understanding and acceptance.     Dental Advisory Given  Plan Discussed with: CRNA  Anesthesia Plan Comments:        Anesthesia Quick Evaluation

## 2023-10-12 NOTE — Transfer of Care (Addendum)
 Immediate Anesthesia Transfer of Care Note  Patient: Chloe Golden  Procedure(s) Performed: EGD (ESOPHAGOGASTRODUODENOSCOPY) DILATION, ESOPHAGUS  Patient Location: Short Stay  Anesthesia Type:General  Level of Consciousness: drowsy  Airway & Oxygen Therapy: Patient Spontanous Breathing  Post-op Assessment: Report given to RN and Post -op Vital signs reviewed and stable  Post vital signs: Reviewed and stable  Last Vitals:  Vitals Value Taken Time  BP 104/53 10/12/23 0752  Temp 35.9 C 10/12/23 0752  Pulse 77 10/12/23 0752  Resp 17 10/12/23 0752  SpO2 97 % 10/12/23 0752    Last Pain:  Vitals:   10/12/23 0752  TempSrc: Axillary  PainSc: 0-No pain         Complications: No notable events documented.

## 2023-10-12 NOTE — Op Note (Signed)
 [facility] Patient Name: Chloe Golden Procedure Date: 10/12/2023 7:12 AM MRN: [id number] Date of Birth: [birth year] Attending MD: Samantha Cress , , [id number] CSN: [id number] Age: 78 Admit Type: Outpatient Procedure:                Upper GI endoscopy Indications:              Dysphagia Providers:                Samantha Cress, Pasco Bond, RN, Sharlette Dayhoff                            Technician, Technician Referring MD:              Medicines:                Monitored Anesthesia Care Complications:            No immediate complications. Estimated Blood Loss:     Estimated blood loss: none. Procedure:                Pre-Anesthesia Assessment:                           - Prior to the procedure, a History and Physical                            was performed, and patient medications, allergies                            and sensitivities were reviewed. The patient's                            tolerance of previous anesthesia was reviewed.                           - The risks and benefits of the procedure and the                            sedation options and risks were discussed with the                            patient. All questions were answered and informed                            consent was obtained.                           - ASA Grade Assessment: II - A patient with mild                            systemic disease.                           After obtaining informed consent, the endoscope was                            passed under direct vision. Throughout the  procedure, the patient's blood pressure, pulse, and                            oxygen saturations were monitored continuously. The                            [id number]-H190 ([id number]) scope was introduced through the                            mouth, and advanced to the second part of duodenum.                            The upper GI endoscopy was accomplished without                             difficulty. The patient tolerated the procedure                            well. Scope In: 7:41:48 AM Scope Out: 7:49:00 AM Total Procedure Duration: 0 hours 7 minutes 12 seconds  Findings:      No endoscopic abnormality was evident in the esophagus to explain the       patient's complaint of dysphagia. It was decided, however, to proceed       with dilation of the entire esophagus. A guidewire was placed and the       scope was withdrawn. Dilation was performed with a Savary dilator with       mild resistance at 20 mm. The dilation site was examined following       endoscope reinsertion and showed mild mucosal disruption.      The stomach was normal.      The examined duodenum was normal. Impression:               - No endoscopic esophageal abnormality to explain                            patient's dysphagia. Esophagus dilated.                           - Normal stomach.                           - Normal examined duodenum.                           - No specimens collected. Moderate Sedation:      Per Anesthesia Care Recommendation:           - Discharge patient to home (ambulatory).                           - Resume previous diet.                           - Continue present medications.                           - Return to GI  clinic as previously scheduled. Procedure Code(s):        --- Professional ---                           [phone], Esophagogastroduodenoscopy, flexible,                            transoral; with insertion of guide wire followed by                            passage of dilator(s) through esophagus over guide                            wire Diagnosis Code(s):        --- Professional ---                           R13.10, Dysphagia, unspecified CPT copyright 2022 American Medical Association. All rights reserved. The codes documented in this report are preliminary and upon coder review may  be revised to meet current compliance requirements. Samantha Cress, MD Samantha Cress,  10/12/2023 8:00:07 AM This report has been signed electronically. Number of Addenda: 0

## 2023-10-12 NOTE — Interval H&P Note (Signed)
 History and Physical Interval Note:  10/12/2023 7:30 AM  Chloe Golden  has presented today for surgery, with the diagnosis of dysphagia, esophageal dysmotility.  The various methods of treatment have been discussed with the patient and family. After consideration of risks, benefits and other options for treatment, the patient has consented to  Procedure(s) with comments: EGD (ESOPHAGOGASTRODUODENOSCOPY) (N/A) - 815AM, ASA 3 DILATION, ESOPHAGUS (N/A) as a surgical intervention.  The patient's history has been reviewed, patient examined, no change in status, stable for surgery.  I have reviewed the patient's chart and labs.  Questions were answered to the patient's satisfaction.     Chevez Sambrano Castaneda Mayorga

## 2023-10-12 NOTE — Discharge Instructions (Addendum)
 You are being discharged to home.  Resume your previous diet.  Continue your present medications.  Return to your GI clinic as previously scheduled.

## 2023-10-13 ENCOUNTER — Encounter (HOSPITAL_COMMUNITY): Payer: Self-pay | Admitting: Gastroenterology

## 2023-10-15 NOTE — Anesthesia Postprocedure Evaluation (Signed)
 Anesthesia Post Note  Patient: Jamilette Suchocki Summey  Procedure(s) Performed: EGD (ESOPHAGOGASTRODUODENOSCOPY) DILATION, ESOPHAGUS  Patient location during evaluation: Phase II Anesthesia Type: General Level of consciousness: awake Pain management: pain level controlled Vital Signs Assessment: post-procedure vital signs reviewed and stable Respiratory status: spontaneous breathing and respiratory function stable Cardiovascular status: blood pressure returned to baseline and stable Postop Assessment: no headache and no apparent nausea or vomiting Anesthetic complications: no Comments: Late entry   No notable events documented.   Last Vitals:  Vitals:   10/12/23 0706 10/12/23 0752  BP: 123/74 (!) 104/53  Pulse: 78 77  Resp: 18 17  Temp: 36.6 C (!) 35.9 C  SpO2: 99% 97%    Last Pain:  Vitals:   10/13/23 1439  TempSrc:   PainSc: 0-No pain                 Coretha Dew

## 2023-10-29 DIAGNOSIS — E1165 Type 2 diabetes mellitus with hyperglycemia: Secondary | ICD-10-CM | POA: Diagnosis not present

## 2023-10-29 DIAGNOSIS — I10 Essential (primary) hypertension: Secondary | ICD-10-CM | POA: Diagnosis not present

## 2023-11-04 DIAGNOSIS — G4730 Sleep apnea, unspecified: Secondary | ICD-10-CM | POA: Diagnosis not present

## 2023-11-04 DIAGNOSIS — E1165 Type 2 diabetes mellitus with hyperglycemia: Secondary | ICD-10-CM | POA: Diagnosis not present

## 2023-11-04 DIAGNOSIS — M069 Rheumatoid arthritis, unspecified: Secondary | ICD-10-CM | POA: Diagnosis not present

## 2023-11-04 DIAGNOSIS — Z0001 Encounter for general adult medical examination with abnormal findings: Secondary | ICD-10-CM | POA: Diagnosis not present

## 2023-11-04 DIAGNOSIS — K219 Gastro-esophageal reflux disease without esophagitis: Secondary | ICD-10-CM | POA: Diagnosis not present

## 2023-11-04 DIAGNOSIS — I10 Essential (primary) hypertension: Secondary | ICD-10-CM | POA: Diagnosis not present

## 2023-11-04 DIAGNOSIS — E782 Mixed hyperlipidemia: Secondary | ICD-10-CM | POA: Diagnosis not present

## 2023-11-04 DIAGNOSIS — R944 Abnormal results of kidney function studies: Secondary | ICD-10-CM | POA: Diagnosis not present

## 2023-11-04 DIAGNOSIS — G4700 Insomnia, unspecified: Secondary | ICD-10-CM | POA: Diagnosis not present

## 2023-11-30 DIAGNOSIS — E1165 Type 2 diabetes mellitus with hyperglycemia: Secondary | ICD-10-CM | POA: Diagnosis not present

## 2023-11-30 DIAGNOSIS — E782 Mixed hyperlipidemia: Secondary | ICD-10-CM | POA: Diagnosis not present

## 2023-11-30 DIAGNOSIS — N3281 Overactive bladder: Secondary | ICD-10-CM | POA: Diagnosis not present

## 2023-11-30 DIAGNOSIS — I10 Essential (primary) hypertension: Secondary | ICD-10-CM | POA: Diagnosis not present

## 2023-12-02 DIAGNOSIS — K219 Gastro-esophageal reflux disease without esophagitis: Secondary | ICD-10-CM | POA: Diagnosis not present

## 2023-12-02 DIAGNOSIS — G4700 Insomnia, unspecified: Secondary | ICD-10-CM | POA: Diagnosis not present

## 2023-12-02 DIAGNOSIS — M069 Rheumatoid arthritis, unspecified: Secondary | ICD-10-CM | POA: Diagnosis not present

## 2023-12-02 DIAGNOSIS — R519 Headache, unspecified: Secondary | ICD-10-CM | POA: Diagnosis not present

## 2023-12-02 DIAGNOSIS — E1165 Type 2 diabetes mellitus with hyperglycemia: Secondary | ICD-10-CM | POA: Diagnosis not present

## 2023-12-02 DIAGNOSIS — R251 Tremor, unspecified: Secondary | ICD-10-CM | POA: Diagnosis not present

## 2023-12-02 DIAGNOSIS — G4730 Sleep apnea, unspecified: Secondary | ICD-10-CM | POA: Diagnosis not present

## 2023-12-02 DIAGNOSIS — I10 Essential (primary) hypertension: Secondary | ICD-10-CM | POA: Diagnosis not present

## 2023-12-02 DIAGNOSIS — G894 Chronic pain syndrome: Secondary | ICD-10-CM | POA: Diagnosis not present

## 2023-12-15 DIAGNOSIS — E782 Mixed hyperlipidemia: Secondary | ICD-10-CM | POA: Diagnosis not present

## 2023-12-15 DIAGNOSIS — E1165 Type 2 diabetes mellitus with hyperglycemia: Secondary | ICD-10-CM | POA: Diagnosis not present

## 2023-12-15 DIAGNOSIS — I10 Essential (primary) hypertension: Secondary | ICD-10-CM | POA: Diagnosis not present

## 2023-12-15 DIAGNOSIS — K219 Gastro-esophageal reflux disease without esophagitis: Secondary | ICD-10-CM | POA: Diagnosis not present

## 2023-12-15 DIAGNOSIS — G894 Chronic pain syndrome: Secondary | ICD-10-CM | POA: Diagnosis not present

## 2023-12-15 DIAGNOSIS — M069 Rheumatoid arthritis, unspecified: Secondary | ICD-10-CM | POA: Diagnosis not present

## 2023-12-15 DIAGNOSIS — K581 Irritable bowel syndrome with constipation: Secondary | ICD-10-CM | POA: Diagnosis not present

## 2023-12-15 DIAGNOSIS — M797 Fibromyalgia: Secondary | ICD-10-CM | POA: Diagnosis not present

## 2023-12-15 DIAGNOSIS — N3281 Overactive bladder: Secondary | ICD-10-CM | POA: Diagnosis not present

## 2023-12-16 ENCOUNTER — Other Ambulatory Visit: Payer: Self-pay | Admitting: *Deleted

## 2023-12-16 ENCOUNTER — Encounter: Payer: Self-pay | Admitting: Internal Medicine

## 2023-12-16 ENCOUNTER — Ambulatory Visit: Admitting: Internal Medicine

## 2023-12-16 VITALS — BP 123/75 | HR 93 | Temp 98.4°F | Ht 64.0 in | Wt 143.4 lb

## 2023-12-16 DIAGNOSIS — K5902 Outlet dysfunction constipation: Secondary | ICD-10-CM

## 2023-12-16 DIAGNOSIS — K224 Dyskinesia of esophagus: Secondary | ICD-10-CM

## 2023-12-16 DIAGNOSIS — R159 Full incontinence of feces: Secondary | ICD-10-CM

## 2023-12-16 DIAGNOSIS — R1319 Other dysphagia: Secondary | ICD-10-CM

## 2023-12-16 DIAGNOSIS — K3184 Gastroparesis: Secondary | ICD-10-CM | POA: Diagnosis not present

## 2023-12-16 DIAGNOSIS — K582 Mixed irritable bowel syndrome: Secondary | ICD-10-CM

## 2023-12-16 DIAGNOSIS — R1310 Dysphagia, unspecified: Secondary | ICD-10-CM | POA: Diagnosis not present

## 2023-12-16 DIAGNOSIS — K219 Gastro-esophageal reflux disease without esophagitis: Secondary | ICD-10-CM

## 2023-12-16 NOTE — Progress Notes (Signed)
 Referring Provider: Shona Norleen PEDLAR, MD Primary Care Physician:  Shona Norleen PEDLAR, MD Primary GI:  Dr. Cindie  Chief Complaint  Patient presents with   Follow-up    Follow up IBS-C/D. Pt not able to hold her bowels is getting worse    HPI:   Chloe Golden is a 78 y.o. female who presents to clinic today for follow-up visit.    IBS, mixed with constipation and diarrhea:  Underwent colonoscopy 10/20/2021 with multiple tubular adenomas removed, random colon biopsies negative for microscopic colitis.  Recommended 3-year recall  She has had issues of incontinence in the past.  She underwent anorectal manometry in 2014 at [facility].  This revealed pelvic floor dyssynergia.  Was recommended she go to physical therapy for this.  Previously seen by urology as well who again recommended pelvic floor PT.    Status post cholecystectomy.  Duodenal biopsies in the past negative for celiac disease.  Previously on Linzess which caused excess diarrhea.  Will switch to Amitiza  8 mcg twice daily.  Initially this helped, then developed breakthrough symptoms of constipation.  Currently taking herbal colon cleanse formula and states her symptoms are well-controlled.  Chronic GERD and dysphagia: Dysphagia chronic, not improved status post recent bougie dilation.  Does have evidence of esophageal dysmotility on previous esophagram.  For her reflux, Has been on Pantoprazole , Dexilant, now on Rabeprazole .  GERD relatively well-controlled.      Past Medical History:  Diagnosis Date   Acid reflux    Arthritis    Degenerative arthritis of cervical spine    Diabetes mellitus without complication (HCC)    Fibromyalgia    Functional gastrointestinal disorder    GERD (gastroesophageal reflux disease)    Glaucoma    Hiatal hernia    Hypertension    Neuropathy    OAB (overactive bladder)    OSA on CPAP    stopped 6mos ago   PAD (peripheral artery disease) (HCC)    Mild by arterial Dopplers April 2019    Rheumatoid arthritis(714.0)    Venous reflux     Past Surgical History:  Procedure Laterality Date   ABDOMINAL HYSTERECTOMY     BACTERIAL OVERGROWTH TEST N/A 11/09/2012   Procedure: BACTERIAL OVERGROWTH TEST;  Surgeon: Margo LITTIE Haddock, MD;  [facility];  Service: Endoscopy;  Laterality: N/A;  7:30   BALLOON DILATION N/A 07/07/2021   Procedure: BALLOON DILATION;  Surgeon: Cindie Carlin POUR, DO;  [facility];  Service: Endoscopy;  Laterality: N/A;   BIOPSY N/A 10/28/2012   Procedure: BIOPSY;  Surgeon: Margo LITTIE Haddock, MD;  [facility];  Service: Endoscopy;  Laterality: N/A;  DUODENAL BIOPSY   BIOPSY  07/07/2021   Procedure: BIOPSY;  Surgeon: Cindie Carlin POUR, DO;  [facility];  Service: Endoscopy;;   BIOPSY  10/20/2021   Procedure: BIOPSY;  Surgeon: Cindie Carlin POUR, DO;  [facility];  Service: Endoscopy;;   BLADDER SURGERY     CARPAL TUNNEL RELEASE Bilateral    CARPOMETACARPEL SUSPENSION PLASTY Right 06/30/2022   Procedure: RIGHT THUMB TRAPEZIECTOMY AND SUSPENSIONPLASTY;  Surgeon: Murrell Drivers, MD;  Location: [facility];  Service: Orthopedics;  Laterality: Right;  90 MIN AUX BLOCK   CHOLECYSTECTOMY     COLONOSCOPY WITH ESOPHAGOGASTRODUODENOSCOPY (EGD) N/A 10/28/2012   DOQ:Fnizmjuz diverticulosis in the descending colon and sigmoid colon/Small internal hemorrhoids   COLONOSCOPY WITH PROPOFOL  Left 10/20/2021   Procedure: COLONOSCOPY WITH PROPOFOL ;  Surgeon: Cindie Carlin  K, DO;  [facility];  Service: Endoscopy;  Laterality: Left;  7:30am   ESOPHAGEAL DILATION N/A 10/12/2023   Procedure: DILATION, ESOPHAGUS;  Surgeon: Eartha Angelia Sieving, MD;  [facility];  Service: Gastroenterology;  Laterality: N/A;   ESOPHAGOGASTRODUODENOSCOPY N/A 11/27/2016   Procedure: ESOPHAGOGASTRODUODENOSCOPY (EGD);  Surgeon: Harvey Margo CROME, MD;  [facility];  Service: Endoscopy;  Laterality: N/A;   830    ESOPHAGOGASTRODUODENOSCOPY N/A 10/12/2023   Procedure: EGD (ESOPHAGOGASTRODUODENOSCOPY);  Surgeon: Eartha Angelia, Sieving, MD;  [facility];  Service: Gastroenterology;  Laterality: N/A;  815AM, ASA 3   ESOPHAGOGASTRODUODENOSCOPY (EGD) WITH ESOPHAGEAL DILATION N/A 10/28/2012   DOQ:EMNKPFJO CERVIAL WEB, S/P DILATION/MILD Non-erosive gastritis & DUODENITIS   ESOPHAGOGASTRODUODENOSCOPY (EGD) WITH PROPOFOL  N/A 07/07/2021   gastritis s/p biopsy, normal duodenum. dilation.  Negative H.pylori.   POLYPECTOMY  10/20/2021   Procedure: POLYPECTOMY;  Surgeon: Cindie Carlin POUR, DO;  [facility];  Service: Endoscopy;;   SAVORY DILATION N/A 11/27/2016   Procedure: SAVORY DILATION;  Surgeon: Harvey Margo CROME, MD;  [facility];  Service: Endoscopy;  Laterality: N/A;   TOTAL KNEE ARTHROPLASTY Right     Current Outpatient Medications  Medication Sig Dispense Refill   ACCU-CHEK GUIDE test strip See admin instructions.     ALPRAZolam (XANAX) 0.5 MG tablet Take 0.5-1 mg by mouth See admin instructions. 0.5 mg in the morning, 1 mg at bedtime     amitriptyline  (ELAVIL ) 25 MG tablet Take 1 tablet (25 mg total) by mouth at bedtime. Take one tablet by mouth with dinner daily. 90 tablet 3   amLODipine (NORVASC) 5 MG tablet Take 5 mg by mouth daily.     cetirizine (ZYRTEC) 10 MG tablet Take 10 mg by mouth daily.     Coenzyme Q10 (COQ-10) 100 MG CAPS Take 100 mg by mouth daily at 6 (six) AM.     conjugated estrogens (PREMARIN) vaginal cream Place 1 applicator vaginally See admin instructions. Twice a week as directed     furosemide (LASIX) 20 MG tablet Take 20 mg by mouth daily.     GEMTESA 75 MG TABS Take 75 mg by mouth daily.     hydroxychloroquine (PLAQUENIL) 200 MG tablet Take 200 mg by mouth daily.     leflunomide (ARAVA) 10 MG tablet Take 10 mg by mouth daily.     losartan-hydrochlorothiazide (HYZAAR) 100-12.5 MG tablet Take 1 tablet by mouth daily.     lubiprostone   (AMITIZA ) 8 MCG capsule TAKE ONE CAPSULE BY MOUTH TWICE DAILY WITH A MEAL 60 capsule 3   memantine (NAMENDA) 5 MG tablet Take 2 tablets by mouth daily.     Multiple Vitamins-Minerals (MULTIVITAMIN WITH MINERALS) tablet Take 1 tablet by mouth daily.     Omega-3 Fatty Acids (FISH OIL) 1000 MG CAPS Take 1,000 mg by mouth daily.     propranolol (INDERAL) 10 MG tablet Take 10 mg by mouth daily.     RABEprazole  (ACIPHEX ) 20 MG tablet TAKE ONE TABLET BY MOUTH TWICE DAILY 60 tablet 0   rOPINIRole (REQUIP) 0.5 MG tablet Take 1.5 mg by mouth at bedtime. (Patient taking differently: Take 10 mg by mouth in the morning and at bedtime.)     traZODone (DESYREL) 50 MG tablet Take 50 mg by mouth at bedtime.     vortioxetine HBr (TRINTELLIX) 10 MG TABS tablet Take 10 mg by mouth daily.     baclofen (LIORESAL) 10 MG tablet Take 10 mg by mouth  2 (two) times daily. (Patient not taking: Reported on 12/16/2023)     JANUVIA 100 MG tablet Take 100 mg by mouth daily. (Patient not taking: Reported on 12/16/2023)     JARDIANCE 10 MG TABS tablet Take 10 mg by mouth daily. (Patient not taking: Reported on 12/16/2023)     simvastatin (ZOCOR) 20 MG tablet Take 20 mg by mouth every Monday, Wednesday, and Friday. (Patient not taking: Reported on 12/16/2023)     No current facility-administered medications for this visit.    Allergies as of 12/16/2023 - Review Complete 12/16/2023  Allergen Reaction Noted   Trazodone Other (See Comments) 07/14/2022   Atorvastatin  09/16/2023   Codeine Other (See Comments) 09/26/2012   Dicyclomine  01/05/2013   Eszopiclone  09/16/2023   Ibuprofen Other (See Comments) 09/26/2012   Keflex  [cephalexin ] Nausea Only 12/04/2022   Paroxetine  09/16/2023   Sulfa antibiotics  11/24/2016   Valium [diazepam] Other (See Comments) 09/26/2012    Family History  Problem Relation Age of Onset   Lung cancer Father    Diabetes Mellitus II Father    Crohn's disease Cousin    Hypertension Mother    Cystic  fibrosis Mother    Lung cancer Brother    Hypertension Brother    Lupus Niece    Colon cancer Neg Hx    Celiac disease Neg Hx    Gastric cancer Neg Hx    Esophageal cancer Neg Hx     Social History   Socioeconomic History   Marital status: Single    Spouse name: Not on file   Number of children: Not on file   Years of education: Not on file   Highest education level: Not on file  Occupational History   Not on file  Tobacco Use   Smoking status: Never    Passive exposure: Past   Smokeless tobacco: Never  Vaping Use   Vaping status: Never Used  Substance and Sexual Activity   Alcohol use: No   Drug use: No   Sexual activity: Yes    Birth control/protection: Surgical    Comment: hyst  Other Topics Concern   Not on file  Social History Narrative   1 living   2 deceased   Social Drivers of Corporate investment banker Strain: Not on file  Food Insecurity: Low Risk  (04/07/2023)   Received from [facility]   Hunger Vital Sign    Within the past 12 months, you worried that your food would run out before you got money to buy more: Never true    Within the past 12 months, the food you bought just didn't last and you didn't have money to get more. : Never true  Transportation Needs: No Transportation Needs (04/07/2023)   Received from [facility]    In the past 12 months, has lack of reliable transportation kept you from medical appointments, meetings, work or from getting things needed for daily living? : No  Physical Activity: Not on file  Stress: Not on file  Social Connections: Not on file    Subjective: Review of Systems  Constitutional:  Negative for chills and fever.  HENT:  Negative for congestion and hearing loss.   Eyes:  Negative for blurred vision and double vision.  Respiratory:  Negative for cough and shortness of breath.   Cardiovascular:  Negative for chest pain and palpitations.  Gastrointestinal:  Positive for diarrhea.  Negative for abdominal pain, blood in stool, constipation,  heartburn, melena and vomiting.       Fecal incontinence  Genitourinary:  Negative for dysuria and urgency.  Musculoskeletal:  Negative for joint pain and myalgias.  Skin:  Negative for itching and rash.  Neurological:  Negative for dizziness and headaches.  Psychiatric/Behavioral:  Negative for depression. The patient is not nervous/anxious.      Objective: BP 123/75   Pulse 93   Temp 98.4 F (36.9 C)   Ht 5' 4 (1.626 m)   Wt 143 lb 6.4 oz (65 kg)   BMI 24.61 kg/m  Physical Exam Constitutional:      Appearance: Normal appearance.  HENT:     Head: Normocephalic and atraumatic.   Eyes:     Extraocular Movements: Extraocular movements intact.     Conjunctiva/sclera: Conjunctivae normal.    Cardiovascular:     Rate and Rhythm: Normal rate and regular rhythm.  Pulmonary:     Effort: Pulmonary effort is normal.     Breath sounds: Normal breath sounds.  Abdominal:     General: Bowel sounds are normal.     Palpations: Abdomen is soft.   Musculoskeletal:        General: No swelling. Normal range of motion.     Cervical back: Normal range of motion and neck supple.   Skin:    General: Skin is warm and dry.     Coloration: Skin is not jaundiced.   Neurological:     General: No focal deficit present.     Mental Status: She is alert and oriented to person, place, and time.   Psychiatric:        Mood and Affect: Mood normal.        Behavior: Behavior normal.      Assessment: *Irritable bowel syndrome with constipation and diarrhea *Chronic GERD *Dysphagia-chronic *Gastroparesis  Plan: Patient's IBS improved on herbal supplement.  Will continue.  In regards to her fecal seepage, and rectal manometry in the past showed evidence of pelvic dyssynergia.  She has previously been against pelvic floor therapy but is agreeable today.  Will make referral today.  Dysphagia chronic, not improved status post  dilation.  Likely has a component of age-related dysmotility.  Patient to avoid tough textures. All meats should be chopped finely. Eat slowly, take small bites, chew thoroughly, and drink plenty of liquids throughout meals.   GERD well-controlled on rabeprazole  twice daily.  Will continue.  Follow-up 3-4 months  12/16/2023 10:19 AM   Disclaimer: This note was dictated with voice recognition software. Similar sounding words can inadvertently be transcribed and may not be corrected upon review.

## 2023-12-16 NOTE — Patient Instructions (Signed)
 I am happy to hear that you are doing well.  Continue on rabeprazole  for your acid reflux.  Continue on herbal supplement for your bowels.  Recommend performing Kegel exercises at home as you have time.  It is always a pleasure seeing you.  Dr. Cindie

## 2023-12-20 ENCOUNTER — Ambulatory Visit: Admitting: Podiatry

## 2023-12-20 DIAGNOSIS — E1142 Type 2 diabetes mellitus with diabetic polyneuropathy: Secondary | ICD-10-CM | POA: Diagnosis not present

## 2023-12-20 NOTE — Progress Notes (Signed)
 Chief Complaint  Patient presents with   Qutenza  application    Qutenza  application   HPI: 78 y.o. female presents today for Qutenza  application #2.  She states that she feels the neuropathy is slightly better after the first application.  She also notes that she will be seeking a podiatrist near her home in [place] for diabetic footcare.  She states our office is too far of a drive to come on a regular basis.  She still gets some occasional pain to the right ankle.  Informed the patient we will try replacing some of the Qutenza  product higher near the ankle to see if she gets any relief to the right ankle.  Past Medical History:  Diagnosis Date   Acid reflux    Arthritis    Degenerative arthritis of cervical spine    Diabetes mellitus without complication (HCC)    Fibromyalgia    Functional gastrointestinal disorder    GERD (gastroesophageal reflux disease)    Glaucoma    Hiatal hernia    Hypertension    Neuropathy    OAB (overactive bladder)    OSA on CPAP    stopped 6mos ago   PAD (peripheral artery disease) (HCC)    Mild by arterial Dopplers April 2019   Rheumatoid arthritis(714.0)    Venous reflux    Past Surgical History:  Procedure Laterality Date   ABDOMINAL HYSTERECTOMY     BACTERIAL OVERGROWTH TEST N/A 11/09/2012   Procedure: BACTERIAL OVERGROWTH TEST;  Surgeon: Margo LITTIE Haddock, MD;  [facility];  Service: Endoscopy;  Laterality: N/A;  7:30   BALLOON DILATION N/A 07/07/2021   Procedure: BALLOON DILATION;  Surgeon: Cindie Carlin POUR, DO;  [facility];  Service: Endoscopy;  Laterality: N/A;   BIOPSY N/A 10/28/2012   Procedure: BIOPSY;  Surgeon: Margo LITTIE Haddock, MD;  [facility];  Service: Endoscopy;  Laterality: N/A;  DUODENAL BIOPSY   BIOPSY  07/07/2021   Procedure: BIOPSY;  Surgeon: Cindie Carlin POUR, DO;  [facility];  Service: Endoscopy;;   BIOPSY  10/20/2021   Procedure: BIOPSY;  Surgeon: Cindie Carlin POUR, DO;   [facility];  Service: Endoscopy;;   BLADDER SURGERY     CARPAL TUNNEL RELEASE Bilateral    CARPOMETACARPEL SUSPENSION PLASTY Right 06/30/2022   Procedure: RIGHT THUMB TRAPEZIECTOMY AND SUSPENSIONPLASTY;  Surgeon: Murrell Drivers, MD;  Location: [facility];  Service: Orthopedics;  Laterality: Right;  90 MIN AUX BLOCK   CHOLECYSTECTOMY     COLONOSCOPY WITH ESOPHAGOGASTRODUODENOSCOPY (EGD) N/A 10/28/2012   DOQ:Fnizmjuz diverticulosis in the descending colon and sigmoid colon/Small internal hemorrhoids   COLONOSCOPY WITH PROPOFOL  Left 10/20/2021   Procedure: COLONOSCOPY WITH PROPOFOL ;  Surgeon: Cindie Carlin POUR, DO;  [facility];  Service: Endoscopy;  Laterality: Left;  7:30am   ESOPHAGEAL DILATION N/A 10/12/2023   Procedure: DILATION, ESOPHAGUS;  Surgeon: Eartha Angelia Sieving, MD;  [facility];  Service: Gastroenterology;  Laterality: N/A;   ESOPHAGOGASTRODUODENOSCOPY N/A 11/27/2016   Procedure: ESOPHAGOGASTRODUODENOSCOPY (EGD);  Surgeon: Haddock Margo LITTIE, MD;  [facility];  Service: Endoscopy;  Laterality: N/A;  830    ESOPHAGOGASTRODUODENOSCOPY N/A 10/12/2023   Procedure: EGD (ESOPHAGOGASTRODUODENOSCOPY);  Surgeon: Eartha Angelia, Sieving, MD;  [facility];  Service: Gastroenterology;  Laterality: N/A;  815AM, ASA 3   ESOPHAGOGASTRODUODENOSCOPY (EGD) WITH ESOPHAGEAL DILATION N/A 10/28/2012   DOQ:EMNKPFJO CERVIAL WEB, S/P DILATION/MILD Non-erosive gastritis & DUODENITIS   ESOPHAGOGASTRODUODENOSCOPY (  EGD) WITH PROPOFOL  N/A 07/07/2021   gastritis s/p biopsy, normal duodenum. dilation.  Negative H.pylori.   POLYPECTOMY  10/20/2021   Procedure: POLYPECTOMY;  Surgeon: Cindie Carlin POUR, DO;  [facility];  Service: Endoscopy;;   SAVORY DILATION N/A 11/27/2016   Procedure: SAVORY DILATION;  Surgeon: Harvey Margo CROME, MD;  [facility];  Service: Endoscopy;  Laterality: N/A;   TOTAL KNEE ARTHROPLASTY  Right    Allergies  Allergen Reactions   Trazodone Other (See Comments)   Atorvastatin     Other Reaction(s): Not available   Codeine Other (See Comments)    Constipation   Dicyclomine     TURNED HER SKIN BLUE   Eszopiclone     Other Reaction(s): Not available   Ibuprofen Other (See Comments)    Ulcers    Keflex  [Cephalexin ] Nausea Only   Paroxetine     Other Reaction(s): Not available   Sulfa Antibiotics     Yeast infections    Valium [Diazepam] Other (See Comments)    Alters mental status   Review of Systems  Neurological:  Positive for sensory change.      Physical Exam: No changes in neurovascular examination from the findings on 09/13/2023  Assessment/Plan of Care: 1. Diabetic polyneuropathy associated with type 2 diabetes mellitus (HCC)     Discussed clinical findings with patient today.  Explained how the Qutenza  will act as a neurolytic agent on the peripheral nerves of the feet to decrease her diabetic neuropathy discomfort.  This will build on the improvement from the previous application.  Since she has approximately 20 to 25% improvement to date, we would expect up to 50% improvement after the second application.   Reviewed Qutenza  application with patient today.  Once applied, she'll sit for 30 minutes while the medication penetrates into the skin.  4 sheets were applied, 2 on each foot.  After 30 minutes, these were discarded and the skin was cleansed with the skin cleanser solution.  She was offered cooling compresses for any burning sensation but stated she did not need them today.   Patient advised to avoid direct sunlight to skin of feet for one week, avoid hot tubs, or any hot linaments to the feet.  Avoid hot baths or hot showers, as this may aggravate burning of the skin.     Will f/u in 91 days for Qutenza  application #3.     Awanda CHARM Imperial, DPM, FACFAS [facility]     [address] [place], [place] [address]                Office [phone]  Fax [phone]

## 2023-12-21 DIAGNOSIS — E782 Mixed hyperlipidemia: Secondary | ICD-10-CM | POA: Diagnosis not present

## 2023-12-21 DIAGNOSIS — I10 Essential (primary) hypertension: Secondary | ICD-10-CM | POA: Diagnosis not present

## 2023-12-21 DIAGNOSIS — E1165 Type 2 diabetes mellitus with hyperglycemia: Secondary | ICD-10-CM | POA: Diagnosis not present

## 2023-12-22 DIAGNOSIS — H524 Presbyopia: Secondary | ICD-10-CM | POA: Diagnosis not present

## 2023-12-22 DIAGNOSIS — H35039 Hypertensive retinopathy, unspecified eye: Secondary | ICD-10-CM | POA: Diagnosis not present

## 2023-12-25 ENCOUNTER — Other Ambulatory Visit: Payer: Self-pay | Admitting: Internal Medicine

## 2023-12-25 ENCOUNTER — Other Ambulatory Visit: Payer: Self-pay | Admitting: Podiatry

## 2023-12-25 DIAGNOSIS — K582 Mixed irritable bowel syndrome: Secondary | ICD-10-CM

## 2023-12-25 DIAGNOSIS — K5900 Constipation, unspecified: Secondary | ICD-10-CM

## 2023-12-30 ENCOUNTER — Other Ambulatory Visit: Payer: Self-pay | Admitting: Podiatry

## 2024-01-03 DIAGNOSIS — M0609 Rheumatoid arthritis without rheumatoid factor, multiple sites: Secondary | ICD-10-CM | POA: Diagnosis not present

## 2024-01-03 DIAGNOSIS — K219 Gastro-esophageal reflux disease without esophagitis: Secondary | ICD-10-CM | POA: Diagnosis not present

## 2024-01-03 DIAGNOSIS — M79644 Pain in right finger(s): Secondary | ICD-10-CM | POA: Diagnosis not present

## 2024-01-03 DIAGNOSIS — M3505 Sjogren syndrome with inflammatory arthritis: Secondary | ICD-10-CM | POA: Diagnosis not present

## 2024-01-03 DIAGNOSIS — Z79899 Other long term (current) drug therapy: Secondary | ICD-10-CM | POA: Diagnosis not present

## 2024-01-03 DIAGNOSIS — M1990 Unspecified osteoarthritis, unspecified site: Secondary | ICD-10-CM | POA: Diagnosis not present

## 2024-01-03 DIAGNOSIS — M797 Fibromyalgia: Secondary | ICD-10-CM | POA: Diagnosis not present

## 2024-01-07 DIAGNOSIS — K581 Irritable bowel syndrome with constipation: Secondary | ICD-10-CM | POA: Diagnosis not present

## 2024-01-07 DIAGNOSIS — G894 Chronic pain syndrome: Secondary | ICD-10-CM | POA: Diagnosis not present

## 2024-01-07 DIAGNOSIS — M797 Fibromyalgia: Secondary | ICD-10-CM | POA: Diagnosis not present

## 2024-01-07 DIAGNOSIS — E782 Mixed hyperlipidemia: Secondary | ICD-10-CM | POA: Diagnosis not present

## 2024-01-07 DIAGNOSIS — M069 Rheumatoid arthritis, unspecified: Secondary | ICD-10-CM | POA: Diagnosis not present

## 2024-01-07 DIAGNOSIS — I10 Essential (primary) hypertension: Secondary | ICD-10-CM | POA: Diagnosis not present

## 2024-01-07 DIAGNOSIS — K219 Gastro-esophageal reflux disease without esophagitis: Secondary | ICD-10-CM | POA: Diagnosis not present

## 2024-01-07 DIAGNOSIS — E1165 Type 2 diabetes mellitus with hyperglycemia: Secondary | ICD-10-CM | POA: Diagnosis not present

## 2024-01-07 DIAGNOSIS — N3281 Overactive bladder: Secondary | ICD-10-CM | POA: Diagnosis not present

## 2024-01-10 DIAGNOSIS — H02831 Dermatochalasis of right upper eyelid: Secondary | ICD-10-CM | POA: Diagnosis not present

## 2024-01-10 DIAGNOSIS — M069 Rheumatoid arthritis, unspecified: Secondary | ICD-10-CM | POA: Diagnosis not present

## 2024-01-10 DIAGNOSIS — H2513 Age-related nuclear cataract, bilateral: Secondary | ICD-10-CM | POA: Diagnosis not present

## 2024-01-10 DIAGNOSIS — Z79899 Other long term (current) drug therapy: Secondary | ICD-10-CM | POA: Diagnosis not present

## 2024-01-16 DIAGNOSIS — T732XXA Exhaustion due to exposure, initial encounter: Secondary | ICD-10-CM | POA: Diagnosis not present

## 2024-01-16 DIAGNOSIS — Z20822 Contact with and (suspected) exposure to covid-19: Secondary | ICD-10-CM | POA: Diagnosis not present

## 2024-01-19 DIAGNOSIS — Z6824 Body mass index (BMI) 24.0-24.9, adult: Secondary | ICD-10-CM | POA: Diagnosis not present

## 2024-01-19 DIAGNOSIS — R059 Cough, unspecified: Secondary | ICD-10-CM | POA: Diagnosis not present

## 2024-01-19 DIAGNOSIS — R030 Elevated blood-pressure reading, without diagnosis of hypertension: Secondary | ICD-10-CM | POA: Diagnosis not present

## 2024-01-19 DIAGNOSIS — U071 COVID-19: Secondary | ICD-10-CM | POA: Diagnosis not present

## 2024-01-21 DIAGNOSIS — E782 Mixed hyperlipidemia: Secondary | ICD-10-CM | POA: Diagnosis not present

## 2024-01-21 DIAGNOSIS — E1165 Type 2 diabetes mellitus with hyperglycemia: Secondary | ICD-10-CM | POA: Diagnosis not present

## 2024-01-21 DIAGNOSIS — K219 Gastro-esophageal reflux disease without esophagitis: Secondary | ICD-10-CM | POA: Diagnosis not present

## 2024-01-21 DIAGNOSIS — I10 Essential (primary) hypertension: Secondary | ICD-10-CM | POA: Diagnosis not present

## 2024-01-25 ENCOUNTER — Other Ambulatory Visit: Payer: Self-pay | Admitting: Internal Medicine

## 2024-01-25 DIAGNOSIS — K219 Gastro-esophageal reflux disease without esophagitis: Secondary | ICD-10-CM

## 2024-01-25 DIAGNOSIS — K582 Mixed irritable bowel syndrome: Secondary | ICD-10-CM

## 2024-01-25 DIAGNOSIS — I10 Essential (primary) hypertension: Secondary | ICD-10-CM | POA: Diagnosis not present

## 2024-01-25 DIAGNOSIS — U099 Post covid-19 condition, unspecified: Secondary | ICD-10-CM | POA: Diagnosis not present

## 2024-01-25 DIAGNOSIS — M069 Rheumatoid arthritis, unspecified: Secondary | ICD-10-CM | POA: Diagnosis not present

## 2024-01-25 DIAGNOSIS — R251 Tremor, unspecified: Secondary | ICD-10-CM | POA: Diagnosis not present

## 2024-01-25 DIAGNOSIS — N3281 Overactive bladder: Secondary | ICD-10-CM | POA: Diagnosis not present

## 2024-01-25 DIAGNOSIS — G4700 Insomnia, unspecified: Secondary | ICD-10-CM | POA: Diagnosis not present

## 2024-01-25 DIAGNOSIS — E1165 Type 2 diabetes mellitus with hyperglycemia: Secondary | ICD-10-CM | POA: Diagnosis not present

## 2024-01-25 DIAGNOSIS — G4730 Sleep apnea, unspecified: Secondary | ICD-10-CM | POA: Diagnosis not present

## 2024-02-04 DIAGNOSIS — M069 Rheumatoid arthritis, unspecified: Secondary | ICD-10-CM | POA: Diagnosis not present

## 2024-02-04 DIAGNOSIS — M797 Fibromyalgia: Secondary | ICD-10-CM | POA: Diagnosis not present

## 2024-02-04 DIAGNOSIS — I10 Essential (primary) hypertension: Secondary | ICD-10-CM | POA: Diagnosis not present

## 2024-02-04 DIAGNOSIS — N3281 Overactive bladder: Secondary | ICD-10-CM | POA: Diagnosis not present

## 2024-02-04 DIAGNOSIS — K219 Gastro-esophageal reflux disease without esophagitis: Secondary | ICD-10-CM | POA: Diagnosis not present

## 2024-02-04 DIAGNOSIS — E1165 Type 2 diabetes mellitus with hyperglycemia: Secondary | ICD-10-CM | POA: Diagnosis not present

## 2024-02-04 DIAGNOSIS — G894 Chronic pain syndrome: Secondary | ICD-10-CM | POA: Diagnosis not present

## 2024-02-04 DIAGNOSIS — K581 Irritable bowel syndrome with constipation: Secondary | ICD-10-CM | POA: Diagnosis not present

## 2024-02-04 DIAGNOSIS — E782 Mixed hyperlipidemia: Secondary | ICD-10-CM | POA: Diagnosis not present

## 2024-02-07 DIAGNOSIS — H25812 Combined forms of age-related cataract, left eye: Secondary | ICD-10-CM | POA: Diagnosis not present

## 2024-02-08 NOTE — H&P (Signed)
 Surgical History & Physical  Patient Name: Chloe Golden  DOB: Mar 27, 1946  Surgery: Cataract extraction with intraocular lens implant phacoemulsification; Left Eye Surgeon: Lynwood Hermann MD Surgery Date: 02/14/2024 Pre-Op Date: 01/10/2024  HPI: A 66 Yr. old female patient 1. The patient is a new patient present for Cataract Evaluation. The patient complains of difficulty when reading fine print, books, newspaper, instructions etc., which began many years ago. Both eyes are affected. The episode is constant. The patient describes foggy symptoms affecting their eyes/vision. This is negatively affecting the patient's quality of life and the patient is unable to function adequately in life with the current level of vision. HP  Medical History: Dry Eyes Cataracts  Arthritis High Blood Pressure  Review of Systems Cardiovascular High Blood Pressure, high cholesterol Musculoskeletal arthritis All recorded systems are negative except as noted above.  Social Never smoked  Medication Amlodipine, Hydroxychloroquine, Leflunomide, Rabeprazole , Trazodone, Propranolol, Alprazolam, Amitriptyline , Nitrofurantoin  macrocrystal, Furosemide, Lubiprostone , Losartan-hydrochlorothiazide, Trintellix, Gemtesa  Sx/Procedures Lasik,  Hand Surgery Rt   Drug Allergies  NKDA  History & Physical: Heent: cataracts NECK: supple without bruits LUNGS: lungs clear to auscultation CV: regular rate and rhythm Abdomen: soft and non-tender  Impression & Plan: Assessment: 1.  NUCLEAR SCLEROSIS AGE RELATED; Both Eyes (H25.13) 2.  Long term exposure to medication, Plaquenil (Z79.899) 3.  Rheumatoid Arthritis (M06.9) 4.  DERMATOCHALASIS, no surgery; Right Upper Lid, Right Lower Lid, Left Upper Lid, Left Lower Lid (H02.831, H02.832,H02.834,H02.835) 5.  BLEPHARITIS; Right Upper Lid, Right Lower Lid, Left Upper Lid, Left Lower Lid (H01.001, H01.002,H01.004,H01.005) 6.  Pinguecula; Both Eyes (H11.153) 7.   CONJUNCTIVOCHALASIS; Both Eyes (H11.823) 8.  s/p LASIK/PRK/RK (Z48.810)  Plan: 1.  Cataract accounts for the patient's decreased vision. This visual impairment is not correctable with a tolerable change in glasses or contact lenses. Cataract surgery with an implantation of a new lens should significantly improve the visual and functional status of the patient. Discussed all risks, benefits, alternatives, and potential complications. Discussed the procedures and recovery. Patient desires to have surgery. A-scan ordered and performed today for intra-ocular lens calculations. The surgery will be performed in order to improve vision for driving, reading, and for eye examinations. Recommend phacoemulsification with intra-ocular lens. Recommend Dextenza for post-operative pain and inflammation. History of refractive Surgery: Hyperopic LASIK. - Barrett True K for Hyperopic LASIK Use of Eye Pressure Lowering Drops: None Left Eye. Dilates poorly - shugarcaine or Lidocaine +Omidira by protocol Malyugin Ring.  2.  4 years. Standard dose. No signs of hydroxychloroquine maculopathy on OCT macula 01/10/24  3.  No associated ocular findings on exam. Observe  4.  Asymptomatic, recommend observation for now. Findings, prognosis and treatment options reviewed.  5.  Blepharitis is present - recommend regular lid cleaning.  6.  Observe; Artificial tears as needed for irritation.  7.  Asymptomatic.  8.  Hyperopic LASIK.

## 2024-02-09 DIAGNOSIS — R350 Frequency of micturition: Secondary | ICD-10-CM | POA: Diagnosis not present

## 2024-02-10 ENCOUNTER — Other Ambulatory Visit: Payer: Self-pay

## 2024-02-10 ENCOUNTER — Encounter (HOSPITAL_COMMUNITY): Payer: Self-pay

## 2024-02-10 ENCOUNTER — Encounter (HOSPITAL_COMMUNITY)
Admission: RE | Admit: 2024-02-10 | Discharge: 2024-02-10 | Disposition: A | Discharge status: Home / Self Care | Source: Ambulatory Visit | Attending: Ophthalmology | Admitting: Ophthalmology

## 2024-02-14 ENCOUNTER — Ambulatory Visit (HOSPITAL_COMMUNITY)
Admission: RE | Admit: 2024-02-14 | Discharge: 2024-02-14 | Disposition: A | Discharge status: Home / Self Care | Attending: Ophthalmology | Admitting: Ophthalmology

## 2024-02-14 ENCOUNTER — Ambulatory Visit (HOSPITAL_COMMUNITY): Admitting: Anesthesiology

## 2024-02-14 ENCOUNTER — Ambulatory Visit (HOSPITAL_BASED_OUTPATIENT_CLINIC_OR_DEPARTMENT_OTHER): Admitting: Anesthesiology

## 2024-02-14 ENCOUNTER — Encounter (HOSPITAL_COMMUNITY)
Admission: RE | Disposition: A | Discharge status: Home / Self Care | Payer: Self-pay | Source: Home / Self Care | Attending: Ophthalmology

## 2024-02-14 ENCOUNTER — Telehealth: Payer: Self-pay

## 2024-02-14 ENCOUNTER — Ambulatory Visit: Attending: Internal Medicine

## 2024-02-14 DIAGNOSIS — K449 Diaphragmatic hernia without obstruction or gangrene: Secondary | ICD-10-CM | POA: Insufficient documentation

## 2024-02-14 DIAGNOSIS — I10 Essential (primary) hypertension: Secondary | ICD-10-CM | POA: Insufficient documentation

## 2024-02-14 DIAGNOSIS — E1136 Type 2 diabetes mellitus with diabetic cataract: Secondary | ICD-10-CM | POA: Insufficient documentation

## 2024-02-14 DIAGNOSIS — H2512 Age-related nuclear cataract, left eye: Secondary | ICD-10-CM | POA: Diagnosis not present

## 2024-02-14 DIAGNOSIS — K219 Gastro-esophageal reflux disease without esophagitis: Secondary | ICD-10-CM | POA: Insufficient documentation

## 2024-02-14 DIAGNOSIS — F418 Other specified anxiety disorders: Secondary | ICD-10-CM | POA: Diagnosis not present

## 2024-02-14 DIAGNOSIS — F32A Depression, unspecified: Secondary | ICD-10-CM | POA: Diagnosis not present

## 2024-02-14 DIAGNOSIS — M069 Rheumatoid arthritis, unspecified: Secondary | ICD-10-CM | POA: Insufficient documentation

## 2024-02-14 DIAGNOSIS — G4730 Sleep apnea, unspecified: Secondary | ICD-10-CM | POA: Insufficient documentation

## 2024-02-14 DIAGNOSIS — H25812 Combined forms of age-related cataract, left eye: Secondary | ICD-10-CM | POA: Diagnosis not present

## 2024-02-14 DIAGNOSIS — G4733 Obstructive sleep apnea (adult) (pediatric): Secondary | ICD-10-CM | POA: Diagnosis not present

## 2024-02-14 HISTORY — PX: CATARACT EXTRACTION W/PHACO: SHX586

## 2024-02-14 LAB — GLUCOSE, CAPILLARY: Glucose-Capillary: 72 mg/dL (ref 70–99)

## 2024-02-14 SURGERY — PHACOEMULSIFICATION, CATARACT, WITH IOL INSERTION
Anesthesia: Monitor Anesthesia Care | Site: Eye | Laterality: Left

## 2024-02-14 MED ORDER — SODIUM HYALURONATE 10 MG/ML IO SOLUTION
PREFILLED_SYRINGE | INTRAOCULAR | Status: DC | PRN
  Administered 2024-02-14: .85 mL via INTRAOCULAR

## 2024-02-14 MED ORDER — DEXTROSE 50 % IV SOLN
12.5000 g | Freq: Once | INTRAVENOUS | Status: AC
  Administered 2024-02-14: 12.5 g via INTRAVENOUS

## 2024-02-14 MED ORDER — LIDOCAINE HCL 3.5 % OP GEL
1.0000 | Freq: Once | OPHTHALMIC | Status: AC
  Administered 2024-02-14: 1 via OPHTHALMIC

## 2024-02-14 MED ORDER — LACTATED RINGERS IV SOLN: INTRAVENOUS | Status: DC

## 2024-02-14 MED ORDER — MOXIFLOXACIN HCL 5 MG/ML IO SOLN
INTRAOCULAR | Status: DC | PRN
  Administered 2024-02-14: .2 mL via OPHTHALMIC

## 2024-02-14 MED ORDER — STERILE WATER FOR IRRIGATION IR SOLN
Status: DC | PRN
  Administered 2024-02-14: 250 mL

## 2024-02-14 MED ORDER — PHENYLEPHRINE HCL 2.5 % OP SOLN
1.0000 [drp] | OPHTHALMIC | Status: AC | PRN
  Administered 2024-02-14 (×3): 1 [drp] via OPHTHALMIC

## 2024-02-14 MED ORDER — BSS IO SOLN
INTRAOCULAR | Status: DC | PRN
  Administered 2024-02-14: 15 mL via INTRAOCULAR

## 2024-02-14 MED ORDER — DEXTROSE 50 % IV SOLN
INTRAVENOUS | Status: AC
  Filled 2024-02-14: qty 50

## 2024-02-14 MED ORDER — PHENYLEPHRINE-KETOROLAC 1-0.3 % IO SOLN
INTRAOCULAR | Status: DC | PRN
  Administered 2024-02-14: 500 mL via OPHTHALMIC

## 2024-02-14 MED ORDER — LIDOCAINE HCL (PF) 1 % IJ SOLN
INTRAMUSCULAR | Status: DC | PRN
  Administered 2024-02-14: 2 mL

## 2024-02-14 MED ORDER — TETRACAINE HCL 0.5 % OP SOLN
1.0000 [drp] | OPHTHALMIC | Status: AC | PRN
  Administered 2024-02-14 (×3): 1 [drp] via OPHTHALMIC

## 2024-02-14 MED ORDER — SODIUM HYALURONATE 23MG/ML IO SOSY
PREFILLED_SYRINGE | INTRAOCULAR | Status: DC | PRN
  Administered 2024-02-14: .6 mL via INTRAOCULAR

## 2024-02-14 MED ORDER — POVIDONE-IODINE 5 % OP SOLN
OPHTHALMIC | Status: DC | PRN
  Administered 2024-02-14: 1 via OPHTHALMIC

## 2024-02-14 MED ORDER — TROPICAMIDE 1 % OP SOLN
1.0000 [drp] | OPHTHALMIC | Status: AC | PRN
  Administered 2024-02-14 (×3): 1 [drp] via OPHTHALMIC

## 2024-02-14 SURGICAL SUPPLY — 11 items
CLOTH BEACON ORANGE TIMEOUT ST (SAFETY) ×1 IMPLANT
EYE SHIELD UNIVERSAL CLEAR (GAUZE/BANDAGES/DRESSINGS) IMPLANT
FEE CATARACT SUITE SIGHTPATH (MISCELLANEOUS) ×1 IMPLANT
GLOVE BIOGEL PI IND STRL 7.0 (GLOVE) ×2 IMPLANT
LENS IOL TECNIS EYHANCE 20.5 (Intraocular Lens) IMPLANT
NDL HYPO 18GX1.5 BLUNT FILL (NEEDLE) ×1 IMPLANT
NEEDLE HYPO 18GX1.5 BLUNT FILL (NEEDLE) ×1 IMPLANT
PAD ARMBOARD POSITIONER FOAM (MISCELLANEOUS) ×1 IMPLANT
SYR TB 1ML LL NO SAFETY (SYRINGE) ×1 IMPLANT
TAPE SURG TRANSPORE 1 IN (GAUZE/BANDAGES/DRESSINGS) IMPLANT
WATER STERILE IRR 250ML POUR (IV SOLUTION) ×1 IMPLANT

## 2024-02-14 NOTE — Interval H&P Note (Signed)
 History and Physical Interval Note:  02/14/2024 1:57 PM  Chloe Golden  has presented today for surgery, with the diagnosis of nuclear sclerotic cataract, left eye.  The various methods of treatment have been discussed with the patient and family. After consideration of risks, benefits and other options for treatment, the patient has consented to  Procedure(s) with comments: PHACOEMULSIFICATION, CATARACT, WITH IOL INSERTION (Left) - CDE: as a surgical intervention.  The patient's history has been reviewed, patient examined, no change in status, stable for surgery.  I have reviewed the patient's chart and labs.  Questions were answered to the patient's satisfaction.     HARRIE AGENT
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
## 2024-02-14 NOTE — Anesthesia Preprocedure Evaluation (Signed)
 Anesthesia Evaluation  Patient identified by MRN, date of birth, ID band Patient awake    Reviewed: Allergy & Precautions, H&P , NPO status , Patient's Chart, lab work & pertinent test results, reviewed documented beta blocker date and time   Airway Mallampati: II  TM Distance: >3 FB Neck ROM: full    Dental no notable dental hx.    Pulmonary sleep apnea    Pulmonary exam normal breath sounds clear to auscultation       Cardiovascular Exercise Tolerance: Good hypertension, + Peripheral Vascular Disease   Rhythm:regular Rate:Normal     Neuro/Psych  PSYCHIATRIC DISORDERS Anxiety Depression   Dementia  Neuromuscular disease    GI/Hepatic Neg liver ROS, hiatal hernia,GERD  ,,  Endo/Other  diabetes    Renal/GU negative Renal ROS  negative genitourinary   Musculoskeletal   Abdominal   Peds  Hematology negative hematology ROS (+)   Anesthesia Other Findings   Reproductive/Obstetrics negative OB ROS                              Anesthesia Physical Anesthesia Plan  ASA: 3  Anesthesia Plan: MAC   Post-op Pain Management:    Induction:   PONV Risk Score and Plan:   Airway Management Planned:   Additional Equipment:   Intra-op Plan:   Post-operative Plan:   Informed Consent: I have reviewed the patients History and Physical, chart, labs and discussed the procedure including the risks, benefits and alternatives for the proposed anesthesia with the patient or authorized representative who has indicated his/her understanding and acceptance.     Dental Advisory Given  Plan Discussed with: CRNA  Anesthesia Plan Comments:         Anesthesia Quick Evaluation

## 2024-02-14 NOTE — Telephone Encounter (Signed)
 Spoke with patient after no-show evaluation for pelvic floor physical therapy. She states she had no awareness of this appointment. She was unable to reschedule at this time, but would like to. She will call our office later today/tomorrow.

## 2024-02-14 NOTE — Transfer of Care (Signed)
 Immediate Anesthesia Transfer of Care Note  Patient: Chloe Golden  Procedure(s) Performed: PHACOEMULSIFICATION, CATARACT, WITH IOL INSERTION (Left: Eye)  Patient Location: Short Stay  Anesthesia Type:MAC  Level of Consciousness: awake, alert , oriented, and patient cooperative  Airway & Oxygen Therapy: Patient Spontanous Breathing  Post-op Assessment: Report given to RN, Post -op Vital signs reviewed and stable, and Patient moving all extremities X 4  Post vital signs: Reviewed and stable  Last Vitals:  Vitals Value Taken Time  BP 166/87 02/14/24 14:24  Temp 36.8 C 02/14/24 14:24  Pulse 94 02/14/24 14:24  Resp 20 02/14/24 14:24  SpO2 98 % 02/14/24 14:24    Last Pain:  Vitals:   02/14/24 1424  TempSrc: Oral  PainSc: 0-No pain         Complications: No notable events documented.

## 2024-02-14 NOTE — Op Note (Signed)
 Date of procedure: 02/14/24  Pre-operative diagnosis: Visually significant age-related combined cataract, Left Eye (H25.812)  Post-operative diagnosis: Visually significant age-related combined cataract, Left Eye (H25.812)  Procedure: Removal of cataract via phacoemulsification and insertion of intra-ocular lens Vicci and Johnson DIB00 +20.5D into the capsular bag of the Left Eye  Attending surgeon: Lynwood LABOR. Lilyana Lippman, MD, MA  Anesthesia: MAC, Topical Akten   Complications: None  Estimated Blood Loss: <54mL (minimal)  Specimens: None  Implants: As above  Indications:  Visually significant age-related cataract, Left Eye  Procedure:  The patient was seen and identified in the pre-operative area. The operative eye was identified and dilated.  The operative eye was marked.  Topical anesthesia was administered to the operative eye.     The patient was then to the operative suite and placed in the supine position.  A timeout was performed confirming the patient, procedure to be performed, and all other relevant information.   The patient's face was prepped and draped in the usual fashion for intra-ocular surgery.  A lid speculum was placed into the operative eye and the surgical microscope moved into place and focused.  An inferotemporal paracentesis was created using a 20 gauge paracentesis blade. Omidria  was injected into the anterior chamber. Shugarcaine was injected into the anterior chamber.  Viscoelastic was injected into the anterior chamber.  A temporal clear-corneal main wound incision was created using a 2.54mm microkeratome.  A continuous curvilinear capsulorrhexis was initiated using an irrigating cystitome and completed using capsulorrhexis forceps.  Hydrodissection and hydrodeliniation were performed.  Viscoelastic was injected into the anterior chamber.  A phacoemulsification handpiece and a chopper as a second instrument were used to remove the nucleus and epinucleus. The  irrigation/aspiration handpiece was used to remove any remaining cortical material.   The capsular bag was reinflated with viscoelastic, checked, and found to be intact.  The intraocular lens was inserted into the capsular bag.  The irrigation/aspiration handpiece was used to remove any remaining viscoelastic.  The clear corneal wound and paracentesis wounds were then hydrated and checked with Weck-Cels to be watertight. 0.1mL of Moxfloxacin was injected into the anterior chamber. The lid-speculum was removed.  The drape was removed.  The patient's face was cleaned with a wet and dry 4x4.    A clear shield was taped over the eye. The patient was taken to the post-operative care unit in good condition, having tolerated the procedure well.  Post-Op Instructions: The patient will follow up at [facility] for a same day post-operative evaluation and will receive all other orders and instructions.

## 2024-02-14 NOTE — Discharge Instructions (Signed)
 Please discharge patient when stable, will follow up today with Dr. June Leap at the [facility] office immediately following discharge.  Leave shield in place until visit.  All paperwork with discharge instructions will be given at the office.  [facility] Address:  [address]  [place], [place] [address]

## 2024-02-15 ENCOUNTER — Encounter (HOSPITAL_COMMUNITY): Payer: Self-pay | Admitting: Ophthalmology

## 2024-02-18 ENCOUNTER — Encounter: Payer: Self-pay | Admitting: Internal Medicine

## 2024-02-19 NOTE — Anesthesia Postprocedure Evaluation (Signed)
 Anesthesia Post Note  Patient: Chloe Golden  Procedure(s) Performed: PHACOEMULSIFICATION, CATARACT, WITH IOL INSERTION (Left: Eye)  Patient location during evaluation: Phase II Anesthesia Type: MAC Level of consciousness: awake Pain management: pain level controlled Vital Signs Assessment: post-procedure vital signs reviewed and stable Respiratory status: spontaneous breathing and respiratory function stable Cardiovascular status: blood pressure returned to baseline and stable Postop Assessment: no headache and no apparent nausea or vomiting Anesthetic complications: no Comments: Late entry   No notable events documented.   Last Vitals:  Vitals:   02/14/24 1300 02/14/24 1424  BP: (!) 146/84 (!) 166/87  Pulse:  94  Resp:  20  Temp: 36.9 C 36.8 C  SpO2: 98% 98%    Last Pain:  Vitals:   02/15/24 1532  TempSrc:   PainSc: 0-No pain                 Yvonna JINNY [name]

## 2024-02-21 DIAGNOSIS — I10 Essential (primary) hypertension: Secondary | ICD-10-CM | POA: Diagnosis not present

## 2024-02-21 DIAGNOSIS — E1165 Type 2 diabetes mellitus with hyperglycemia: Secondary | ICD-10-CM | POA: Diagnosis not present

## 2024-02-21 DIAGNOSIS — E782 Mixed hyperlipidemia: Secondary | ICD-10-CM | POA: Diagnosis not present

## 2024-02-22 ENCOUNTER — Encounter (HOSPITAL_COMMUNITY)

## 2024-02-22 NOTE — Addendum Note (Signed)
 Addended by: GAYLENE MADELIN CROME on: 02/22/2024 09:33 AM   Modules accepted: Orders

## 2024-02-22 NOTE — Addendum Note (Signed)
 Addended by: GAYLENE MADELIN CROME on: 02/22/2024 09:36 AM   Modules accepted: Orders

## 2024-02-24 ENCOUNTER — Other Ambulatory Visit: Payer: Self-pay

## 2024-02-24 ENCOUNTER — Encounter: Payer: Self-pay | Admitting: Physical Therapy

## 2024-02-24 ENCOUNTER — Ambulatory Visit: Attending: Internal Medicine | Admitting: Physical Therapy

## 2024-02-24 DIAGNOSIS — R293 Abnormal posture: Secondary | ICD-10-CM | POA: Diagnosis not present

## 2024-02-24 DIAGNOSIS — R279 Unspecified lack of coordination: Secondary | ICD-10-CM | POA: Diagnosis not present

## 2024-02-24 DIAGNOSIS — M6281 Muscle weakness (generalized): Secondary | ICD-10-CM | POA: Insufficient documentation

## 2024-02-24 DIAGNOSIS — K5902 Outlet dysfunction constipation: Secondary | ICD-10-CM | POA: Insufficient documentation

## 2024-02-24 NOTE — Therapy (Signed)
 OUTPATIENT PHYSICAL THERAPY FEMALE PELVIC EVALUATION/discharge summary   Patient Name: Chloe Golden MRN: [id number] DOB:03-Oct-1945, 78 y.o., female Today's Date: 02/24/2024  END OF SESSION:  PT End of Session - 02/24/24 1050     Visit Number 1    Number of Visits 1    Authorization Type [employer] MCR    PT Start Time 1015    PT Stop Time 1100    PT Time Calculation (min) 45 min    Activity Tolerance Patient tolerated treatment well    Behavior During Therapy WFL for tasks assessed/performed          Past Medical History:  Diagnosis Date   Acid reflux    Arthritis    Degenerative arthritis of cervical spine    Diabetes mellitus without complication (HCC)    Fibromyalgia    Functional gastrointestinal disorder    GERD (gastroesophageal reflux disease)    Glaucoma    Hiatal hernia    Hypertension    Neuropathy    OAB (overactive bladder)    OSA on CPAP    stopped 2.5 years ago as of 02/10/2024-could not tolerate the mask   PAD (peripheral artery disease) (HCC)    Mild by arterial Dopplers April 2019   Rheumatoid arthritis(714.0)    Venous reflux    Past Surgical History:  Procedure Laterality Date   ABDOMINAL HYSTERECTOMY     BACTERIAL OVERGROWTH TEST N/A 11/09/2012   Procedure: BACTERIAL OVERGROWTH TEST;  Surgeon: Margo LITTIE Haddock, MD;  Location: [facility];  Service: Endoscopy;  Laterality: N/A;  7:30   BALLOON DILATION N/A 07/07/2021   Procedure: BALLOON DILATION;  Surgeon: Cindie Carlin POUR, DO;  Location: [facility];  Service: Endoscopy;  Laterality: N/A;   BIOPSY N/A 10/28/2012   Procedure: BIOPSY;  Surgeon: Margo LITTIE Haddock, MD;  [facility]: [facility];  Service: Endoscopy;  Laterality: N/A;  DUODENAL BIOPSY   BIOPSY  07/07/2021   Procedure: BIOPSY;  Surgeon: Cindie Carlin POUR, DO;  [facility]: [facility];  Service: Endoscopy;;   BIOPSY  10/20/2021   Procedure: BIOPSY;  Surgeon: Cindie Carlin POUR, DO;  Location: [facility];  Service:  Endoscopy;;   BLADDER SURGERY     CARPAL TUNNEL RELEASE Bilateral    CARPOMETACARPEL SUSPENSION PLASTY Right 06/30/2022   Procedure: RIGHT THUMB TRAPEZIECTOMY AND SUSPENSIONPLASTY;  Surgeon: Murrell Drivers, MD;  Location: [facility];  Service: Orthopedics;  Laterality: Right;  90 MIN AUX BLOCK   CATARACT EXTRACTION W/PHACO Left 02/14/2024   Procedure: PHACOEMULSIFICATION, CATARACT, WITH IOL INSERTION;  Surgeon: Harrie Agent, MD;  Location: [facility];  Service: Ophthalmology;  Laterality: Left;  CDE: 11.78   CHOLECYSTECTOMY     COLONOSCOPY WITH ESOPHAGOGASTRODUODENOSCOPY (EGD) N/A 10/28/2012   DOQ:Fnizmjuz diverticulosis in the descending colon and sigmoid colon/Small internal hemorrhoids   COLONOSCOPY WITH PROPOFOL  Left 10/20/2021   Procedure: COLONOSCOPY WITH PROPOFOL ;  Surgeon: Cindie Carlin POUR, DO;  [facility]: [facility];  Service: Endoscopy;  Laterality: Left;  7:30am   ESOPHAGEAL DILATION N/A 10/12/2023   Procedure: DILATION, ESOPHAGUS;  Surgeon: Eartha Angelia Sieving, MD;  [facility];  Service: Gastroenterology;  Laterality: N/A;   ESOPHAGOGASTRODUODENOSCOPY N/A 11/27/2016   Procedure: ESOPHAGOGASTRODUODENOSCOPY (EGD);  Surgeon: Haddock Margo LITTIE, MD;  [facility]: [facility];  Service: Endoscopy;  Laterality: N/A;  830    ESOPHAGOGASTRODUODENOSCOPY N/A 10/12/2023   Procedure: EGD (ESOPHAGOGASTRODUODENOSCOPY);  Surgeon: Eartha Angelia, Sieving, MD;  [facility]: [facility];  Service: Gastroenterology;  Laterality:  N/A;  815AM, ASA 3   ESOPHAGOGASTRODUODENOSCOPY (EGD) WITH ESOPHAGEAL DILATION N/A 10/28/2012   DOQ:[id number] CERVIAL WEB, S/P DILATION/MILD Non-erosive gastritis & DUODENITIS   ESOPHAGOGASTRODUODENOSCOPY (EGD) WITH PROPOFOL  N/A 07/07/2021   gastritis s/p biopsy, normal duodenum. dilation.  Negative H.pylori.   POLYPECTOMY  10/20/2021   Procedure: POLYPECTOMY;  Surgeon: Cindie Carlin POUR, DO;  [facility];  Service: Endoscopy;;   SAVORY  DILATION N/A 11/27/2016   Procedure: SAVORY DILATION;  Surgeon: Harvey Margo CROME, MD;  [facility]: [facility];  Service: Endoscopy;  Laterality: N/A;   TOTAL KNEE ARTHROPLASTY Right    Patient Active Problem List   Diagnosis Date Noted   Vitamin D deficiency 12/03/2022   Restless legs 12/03/2022   Periodic limb movement disorder 12/03/2022   Peripheral venous insufficiency 12/03/2022   Vascular insufficiency 12/03/2022   Peripheral vascular disease (HCC) 12/03/2022   Adjustment disorder with mixed anxiety and depressed mood 12/03/2022   At risk for falls 12/03/2022   Suburethral sling present 12/03/2022   Urinary frequency 12/03/2022   Nocturia 12/03/2022   Urinary urgency 12/03/2022   Mixed anxiety and depressive disorder 08/26/2022   Obstructive sleep apnea syndrome 07/27/2022   Hardening of the aorta (main artery of the heart) (HCC) 07/27/2022   Obesity 06/11/2022   Constipation 06/02/2022   Tremor 12/11/2021   Fecal incontinence 07/29/2021   OAB (overactive bladder) 07/02/2021   Urge incontinence 07/02/2021   History of UTI 07/02/2021   Generalized anxiety disorder 04/22/2021   Type 2 diabetes mellitus (HCC) 04/07/2021   Rheumatoid arthritis (HCC) 04/07/2021   Dementia (HCC) 04/07/2021   Mixed hyperlipidemia 04/07/2021   Essential hypertension 04/07/2021   Major depressive disorder 04/07/2021   Irritable bowel syndrome with constipation 04/07/2021   Hyperglycemia due to type 2 diabetes mellitus (HCC) 03/26/2021   Chronic insomnia 01/21/2021   Impairment of balance 12/31/2020   Recurrent falls 12/16/2020   Neuropathy 12/16/2020   Pain in right hip 10/15/2020   Fibromyalgia 10/15/2020   Osteoarthritis of carpometacarpal joint of right thumb 10/15/2020   S/P TKR (total knee replacement) using cement, right 05/14/2020   Chronic pain of right knee 02/07/2020   GERD (gastroesophageal reflux disease) 09/28/2012   Esophageal dysphagia 09/28/2012   Hiatal hernia 09/28/2012    Lumbago 07/21/2012    PCP: Shona Norleen PEDLAR, MD  REFERRING PROVIDER: Cindie Carlin POUR, DO   REFERRING DIAG: [phone] (ICD-10-CM) - Rectosphincteric dyssynergia  THERAPY DIAG:  Muscle weakness (generalized)  Unspecified lack of coordination  Abnormal posture  Rationale for Evaluation and Treatment: Rehabilitation  ONSET DATE: been going on for 20 years   SUBJECTIVE:  SUBJECTIVE STATEMENT: Patient reports that for over 20 years she has had fecal incontinence. When she has to have a bowel movement, she has to go right then and if she doesn't go within a minute, she will lose control. This happens daily. She had to stop twice on the way here today to use the bathroom. Since this clinic is 1 hour from her home, she wishes today to be her only visit.  Fluid: 4 glasses of water  per day, maybe 5; 2 cups of decaf coffee in the morning    PAIN:  Are you having pain? Yes NPRS scale: 7-8/10 Pain location: Deep, Bilateral, and Anterior lower abdominal pain when she feels like she is constipated   Pain type: aching Pain description: intermittent   Aggravating factors: constipation  Relieving factors: bowel movement   PRECAUTIONS: None  RED FLAGS: None   WEIGHT BEARING RESTRICTIONS: No  FALLS:  Has patient fallen in last 6 months? 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   PLOF: Independent  PATIENT GOALS: decrease fecal incontinence episodes   PERTINENT HISTORY:  Abdoinal hysterectomy 2014, bladder surgery, cholecystectomy 2014, fibromyalgia, diabetes II, GERD, hiatal hernia, overactive bladder, rheumatoid arthritis,  Sexual abuse: No  BOWEL MOVEMENT: Pain with bowel movement: No Type of bowel movement:Type (Bristol Stool Scale) 7,  Frequency 2-5 times depending on the day, Strain no, and Splinting no Fully empty rectum: No Leakage: Yes: if she cannot make it to the bathroom in time  Pads: Yes: wears depends if she thinks she is having diarrhea  Fiber supplement/laxative Yes - probiotics, fiber gummies  URINATION:  Pain with urination: No Fully empty bladder: Yes:   Stream: Strong Urgency: Yes  Frequency: within normal limits  Fluid Intake: within normal limits  Leakage: none Pads: No  INTERCOURSE:  Ability to have vaginal penetration Yes  Pain with intercourse: none DrynessYes  and No Climax: yes Marinoff Scale: 0/3 *uses Premarin 2x/wk for maintenance levels*  PREGNANCY: Vaginal deliveries 3 Tearing No Episiotomy No C-section deliveries 0 Currently pregnant No  PROLAPSE: None  OBJECTIVE:  Note: Objective measures were completed at Evaluation unless otherwise noted.  02/24/24 PATIENT SURVEYS:  PFIQ-7: 45  COGNITION: Overall cognitive status: Within functional limits for tasks assessed     SENSATION: Light touch: Appears intact   FUNCTIONAL TESTS:  Squat: dynamic knee valgus with loading  Single leg stance:  Rt: hip drop  Lt: hip drop  Curl-up test: 1/3  GAIT: Assistive device utilized: None Comments: moderate trendelenburg gait pattern with ambulation   POSTURE: rounded shoulders, forward head, and flexed trunk   LUMBARAROM/PROM:  A/PROM A/PROM  Eval (% available)  Flexion 75  Extension 75  Right lateral flexion 50  Left lateral flexion 50  Right rotation 75  Left rotation 75   (Blank rows = not tested)  PALPATION:   General: no tenderness to palpation of bilateral adductors or hip flexors   Pelvic Alignment: within normal limits   Abdominal: upper chest breathing, abdominal bracing at rest, decreased lower rib excursion with inhalation                 External Perineal Exam: mild dryness present, anal wink reflex very low in reflexivity                               Internal Pelvic Floor: patient fully consents to today's internal  examination. She demonstrates low external anal sphincter tone and a mild anal wink reflex response. She demonstrates rectal dyssynergy when trying to contract/relax the musculature. She tends to contract her external anal sphincter when inhaling rather than lengthening the musculature. Lack of coordination seems to be the patient's largest impairment in the rectum at this time. She was taught deep diaphragmatic breathing and pelvic floor range of motion training, along with endurance training for the pelvic floor.   Patient confirms identification and approves PT to assess internal pelvic floor and treatment Yes  PELVIC MMT:   MMT eval  Vaginal   Internal Anal Sphincter 3/5, 2 quick flicks, 2 sec hold  External Anal Sphincter 3/5, 2 quick flicks, 2 sec hold  Puborectalis 3/5, 2 quick flicks, 2 sec hold  Diastasis Recti   (Blank rows = not tested)       TONE: Low until repeat contractions performed, then patient's EAS became hypertonic   PROLAPSE: None present in sidelying   TODAY'S TREATMENT:                                                                                                                              DATE:  02/14/24 EVAL Examination completed, findings reviewed, pt educated on POC, HEP, and self care. Pt motivated to participate in PT and agreeable to attempt recommendations.   Exercises - Supine Pelvic Floor Contraction  - 1 x daily - 7 x weekly - 3 sets - 10 reps - Quick Flick Pelvic Floor Contractions in Hooklying  - 1 x daily - 7 x weekly - 3 sets - 10 reps  Patient Education - Bowel Emptying Techniques - Bowel Emptying Techniques  PATIENT EDUCATION:  Education details: See above Person educated: Patient Education method: Explanation, Demonstration, Tactile cues, Verbal cues, and Handouts Education comprehension: verbalized understanding  HOME EXERCISE PROGRAM: Access Code: [email or web address] URL:  [email or web address] Date: 02/24/2024 Prepared by: Celena Domino  Exercises - Supine Pelvic Floor Contraction  - 1 x daily - 7 x weekly - 3 sets - 10 reps - Quick Flick Pelvic Floor Contractions in Hooklying  - 1 x daily - 7 x weekly - 3 sets - 10 reps  Patient Education - Bowel Emptying Techniques - Bowel Emptying Techniques  ASSESSMENT:  CLINICAL IMPRESSION: Patient is a 78 y.o. female who was seen today for physical therapy evaluation and treatment for fecal incontinence.  Patient fully consents to today's internal examination. She demonstrates low external anal sphincter tone and a mild anal wink reflex response. She demonstrates rectal dyssynergy when trying to contract/relax the musculature. She tends to contract her external anal sphincter when inhaling rather than lengthening the musculature. Lack of coordination seems to be the patient's largest impairment in the rectum at this time. She was taught deep diaphragmatic breathing and pelvic floor range of motion training, along with endurance training for the pelvic floor.   OBJECTIVE IMPAIRMENTS: decreased activity tolerance, decreased coordination, decreased endurance, decreased mobility, decreased  ROM, decreased strength, increased fascial restrictions, increased muscle spasms, impaired flexibility, impaired tone, improper body mechanics, postural dysfunction, and pain.   ACTIVITY LIMITATIONS: continence  PARTICIPATION LIMITATIONS: community activity  PERSONAL FACTORS: Age, Past/current experiences, and Time since onset of injury/illness/exacerbation are also affecting patient's functional outcome.   REHAB POTENTIAL: Good  CLINICAL DECISION MAKING: Stable/uncomplicated  EVALUATION COMPLEXITY: Low   GOALS: Goals reviewed with patient? Yes  SHORT TERM GOALS: Target date: 03/13/2024   Pt will be independent with HEP in order to improve activity tolerance.   Baseline: Goal status: INITIAL  2.  Pt will be  independent with use of squatty potty, relaxed toileting mechanics, and improved bowel movement techniques in order to increase ease of bowel movements and complete evacuation.   Baseline:  Goal status: INITIAL  3.  Pt will be independent with diaphragmatic breathing and down training activities in order to improve pelvic floor relaxation.  Baseline:  Goal status: INITIAL  LONG TERM GOALS: Target date: 08/23/2024  Pt will be independent with advanced HEP in order to improve activity tolerance.   Baseline:  Goal status: INITIAL  2.  Pt to demonstrate improved coordination of pelvic floor and breathing mechanics with 10# squat with appropriate synergistic patterns to decrease pain and leakage at least 75% of the time for improved ability to complete a 30 minute workout with strain at pelvic floor and symptoms.   Baseline:  Goal status: INITIAL  3.  Pt will demonstrate normal pelvic floor muscle tone and A/ROM, able to achieve 3/5 strength with contractions and 10 sec endurance, in order to reduce urinary leaking and number of pads patient wears.   Baseline:  Goal status: INITIAL  PLAN:  PT FREQUENCY: 1-2x/week  PT DURATION: 1 week    PLANNED INTERVENTIONS: 97164- PT Re-evaluation, 97110-Therapeutic exercises, 97530- Therapeutic activity, 97112- Neuromuscular re-education, 97535- Self Care, 02859- Manual therapy, [phone]- Gait training, [phone]- Aquatic Therapy, [phone]- Electrical stimulation (unattended), [phone]- Traction (mechanical), [id number]- Ionotophoresis 4mg /ml Dexamethasone, 79439 (1-2 muscles), 20561 (3+ muscles)- Dry Needling, Patient/Family education, Balance training, Taping, Joint mobilization, Joint manipulation, Spinal manipulation, Spinal mobilization, Scar mobilization, Vestibular training, Cryotherapy, Moist heat, and Biofeedback  PLAN FOR NEXT SESSION: none  PHYSICAL THERAPY DISCHARGE SUMMARY  Visits from Start of Care: 1  Current functional level related to goals /  functional outcomes: See above   Remaining deficits: See above   Education / Equipment: See above   Patient agrees to discharge. Patient goals were established but patient is not continuing therapy at this time. Patient is being discharged due to being pleased with the current functional level.  Celena Domino, PT, DPT 02/24/24 12:27 PM

## 2024-02-24 NOTE — Patient Instructions (Signed)
 Fiber Table -- Grams of Fiber in Food  For additional information on fiber content in foods, go to www.caloriecounts.com  Food Products Serving Size Grams of Fiber/serving  Breads    Whole Wheat 1 slice 2.11  White 1 slice 0.5  Rye 1 slice 1.72  Cereals    Oat Bran 1 oz. 4.06  Wheat Bran 1 oz. 10.0  All Bran  cup 6.0  Optimum 1 cup 10.0  Whole Wheat Total 1 cup 3.0  Fiber One   cup 13.0  Shredded Wheat 1oz. 2.64  Corn Flakes 1 oz. 0.45  Cheerio's 1 1/3 cup 2.0  Oatmeal 1 oz. 2.5  Rice    Brown  cup 5.27  White  cup 1.42  Spaghetti 2 oz. 2.56  Vegetables (cooked)    Broccoli  cup 2.58  Brussels sprouts  cup 2.0  Cauliflower  cup 2.6  Carrots  cup 3.2  Corn  cup 3.03  Eggplant  cup 0.96  Green peas  cup 3.36  Lettuce (raw)  cup 0.24  Baked potato w/skin  cup 2.97  Spinach  cup 2.07  Squash  cup 2.87  Tomato (raw)  cup 1.17  Zucchini  cup 1.26  Beans    Green (canned)  cup 1.89  Kidney  cup 5.48  Lima  cup 4.25  Pinto  cup 5.93  Fresh fruits    Apple (with peel) 1 medium 2.76  Apricots 1 cup 3.13  Banana  1 medium 2.19  Black/Boysenberries 1 cup 7.2  Grapefruit 1 medium 3.61  Grapes 1 cup 1.12  Nectarine 1 medium 2.2  Orange 1 medium 3.14  Pear (with peel) 1 medium 4.32  Prunes 3 3.5  Raspberries 1 cup 7.5  Strawberries 1 cup 3.87  Watermelon 1 slice 1.93

## 2024-02-25 DIAGNOSIS — N3281 Overactive bladder: Secondary | ICD-10-CM | POA: Diagnosis not present

## 2024-02-25 DIAGNOSIS — G894 Chronic pain syndrome: Secondary | ICD-10-CM | POA: Diagnosis not present

## 2024-02-25 DIAGNOSIS — E1165 Type 2 diabetes mellitus with hyperglycemia: Secondary | ICD-10-CM | POA: Diagnosis not present

## 2024-02-25 DIAGNOSIS — M797 Fibromyalgia: Secondary | ICD-10-CM | POA: Diagnosis not present

## 2024-02-25 DIAGNOSIS — M069 Rheumatoid arthritis, unspecified: Secondary | ICD-10-CM | POA: Diagnosis not present

## 2024-02-25 DIAGNOSIS — I10 Essential (primary) hypertension: Secondary | ICD-10-CM | POA: Diagnosis not present

## 2024-02-25 DIAGNOSIS — E782 Mixed hyperlipidemia: Secondary | ICD-10-CM | POA: Diagnosis not present

## 2024-02-25 DIAGNOSIS — K219 Gastro-esophageal reflux disease without esophagitis: Secondary | ICD-10-CM | POA: Diagnosis not present

## 2024-02-25 DIAGNOSIS — K581 Irritable bowel syndrome with constipation: Secondary | ICD-10-CM | POA: Diagnosis not present

## 2024-03-06 DIAGNOSIS — H25811 Combined forms of age-related cataract, right eye: Secondary | ICD-10-CM | POA: Diagnosis not present

## 2024-03-08 ENCOUNTER — Encounter (HOSPITAL_COMMUNITY)
Admission: RE | Admit: 2024-03-08 | Discharge: 2024-03-08 | Disposition: A | Discharge status: Home / Self Care | Source: Ambulatory Visit | Attending: Ophthalmology | Admitting: Ophthalmology

## 2024-03-08 NOTE — H&P (Signed)
 Surgical History & Physical  Patient Name: Chloe Golden  DOB: 1945/10/31  Surgery: Cataract extraction with intraocular lens implant phacoemulsification; Right Eye Surgeon: Lynwood Hermann MD Surgery Date: 03/10/2024 Pre-Op Date: 03/06/2024  HPI: A 78 Yr. old female patient 1. The patient is returning after cataract surgery. The left eye is affected. Status post cataract surgery, which began 12 days ago: Since the last visit, the affected area feels improvement. The patient's vision is improved. The condition's severity is constant. Patient is following medication instructions. 2. 2. The patient is returning for a cataract follow-up of the right eye. Since the last visit, the affected area is tolerating. The patient's vision is blurry. The condition's severity is constant. Patient is not taking medications. This is negatively affecting the patient's quality of life and the patient is unable to function adequately in life with the current level of vision. HPI Completed by Dr. Lynwood Hermann  Medical History: Dry Eyes Cataracts  Arthritis High Blood Pressure  Review of Systems Cardiovascular High Blood Pressure, high cholesterol Musculoskeletal arthritis All recorded systems are negative except as noted above.  Social Never smoked    Medication Prednisolone-moxiflox-bromfen, Ilevro, Prednisolone acetate,  Amlodipine, Hydroxychloroquine, Leflunomide, Rabeprazole , Trazodone, Propranolol, Alprazolam, Amitriptyline , Nitrofurantoin  macrocrystal, Furosemide, Lubiprostone , Losartan-hydrochlorothiazide, Trintellix, Gemtesa  Sx/Procedures Lasik, Phaco c IOL OS,  Hand Surgery  Drug Allergies  NKDA  History & Physical: Heent: cataract NECK: supple without bruits LUNGS: lungs clear to auscultation CV: regular rate and rhythm Abdomen: soft and non-tender  Impression & Plan: Assessment: 1.  CATARACT EXTRACTION STATUS; Left Eye (Z98.42) 2.  COMBINED FORMS AGE RELATED CATARACT; Right Eye  (H25.811)  Plan: 1.  3 weeks after cataract surgery. Doing well with improved vision and normal eye pressure. Call with any problems or concerns. Continue Pred-Mox-Brom Combo drop 2x/day for 1 more week.  2.  Cataract accounts for the patient's decreased vision. This visual impairment is not correctable with a tolerable change in glasses or contact lenses. Cataract surgery with an implantation of a new lens should significantly improve the visual and functional status of the patient.Discussed all risks, benefits, alternatives, and potential complications. Discussed the procedures and recovery. Patient desires to have surgery. A-scan ordered and performed today for intra-ocular lens calculations. The surgery will be performed in order to improve vision for driving, reading, and for eye examinations. Recommend phacoemulsification with intra-ocular lens. Recommend Dextenza for post-operative pain and inflammation. History of refractive Surgery: Hyperopic LASIK. - Barrett True K for Hyperopic LASIK Use of Eye Pressure Lowering Drops: None Right eye. Dilates poorly - shugarcaine or Lidocaine +Omidira by protocol Malyugin Ring.

## 2024-03-10 ENCOUNTER — Ambulatory Visit (HOSPITAL_COMMUNITY): Admitting: Anesthesiology

## 2024-03-10 ENCOUNTER — Encounter (HOSPITAL_COMMUNITY)
Admission: RE | Disposition: A | Discharge status: Home / Self Care | Payer: Self-pay | Source: Home / Self Care | Attending: Ophthalmology

## 2024-03-10 ENCOUNTER — Ambulatory Visit (HOSPITAL_COMMUNITY)
Admission: RE | Admit: 2024-03-10 | Discharge: 2024-03-10 | Disposition: A | Discharge status: Home / Self Care | Attending: Ophthalmology | Admitting: Ophthalmology

## 2024-03-10 ENCOUNTER — Encounter (HOSPITAL_COMMUNITY): Payer: Self-pay | Admitting: Ophthalmology

## 2024-03-10 DIAGNOSIS — I10 Essential (primary) hypertension: Secondary | ICD-10-CM | POA: Insufficient documentation

## 2024-03-10 DIAGNOSIS — F32A Depression, unspecified: Secondary | ICD-10-CM | POA: Diagnosis not present

## 2024-03-10 DIAGNOSIS — G4730 Sleep apnea, unspecified: Secondary | ICD-10-CM | POA: Diagnosis not present

## 2024-03-10 DIAGNOSIS — H2511 Age-related nuclear cataract, right eye: Secondary | ICD-10-CM

## 2024-03-10 DIAGNOSIS — F419 Anxiety disorder, unspecified: Secondary | ICD-10-CM | POA: Diagnosis not present

## 2024-03-10 DIAGNOSIS — Z9842 Cataract extraction status, left eye: Secondary | ICD-10-CM | POA: Diagnosis not present

## 2024-03-10 DIAGNOSIS — E1136 Type 2 diabetes mellitus with diabetic cataract: Secondary | ICD-10-CM | POA: Insufficient documentation

## 2024-03-10 DIAGNOSIS — E119 Type 2 diabetes mellitus without complications: Secondary | ICD-10-CM

## 2024-03-10 DIAGNOSIS — F418 Other specified anxiety disorders: Secondary | ICD-10-CM | POA: Diagnosis not present

## 2024-03-10 DIAGNOSIS — H25811 Combined forms of age-related cataract, right eye: Secondary | ICD-10-CM | POA: Diagnosis not present

## 2024-03-10 DIAGNOSIS — E1151 Type 2 diabetes mellitus with diabetic peripheral angiopathy without gangrene: Secondary | ICD-10-CM | POA: Diagnosis not present

## 2024-03-10 HISTORY — PX: CATARACT EXTRACTION W/PHACO: SHX586

## 2024-03-10 LAB — GLUCOSE, CAPILLARY: Glucose-Capillary: 97 mg/dL (ref 70–99)

## 2024-03-10 SURGERY — PHACOEMULSIFICATION, CATARACT, WITH IOL INSERTION
Anesthesia: Monitor Anesthesia Care | Site: Eye | Laterality: Right

## 2024-03-10 MED ORDER — SODIUM HYALURONATE 10 MG/ML IO SOLUTION
PREFILLED_SYRINGE | INTRAOCULAR | Status: DC | PRN
  Administered 2024-03-10: .85 mL via INTRAOCULAR

## 2024-03-10 MED ORDER — POVIDONE-IODINE 5 % OP SOLN
OPHTHALMIC | Status: DC | PRN
  Administered 2024-03-10: 1 via OPHTHALMIC

## 2024-03-10 MED ORDER — SODIUM HYALURONATE 23MG/ML IO SOSY
PREFILLED_SYRINGE | INTRAOCULAR | Status: DC | PRN
  Administered 2024-03-10: .6 mL via INTRAOCULAR

## 2024-03-10 MED ORDER — LIDOCAINE HCL 3.5 % OP GEL
1.0000 | Freq: Once | OPHTHALMIC | Status: AC
  Administered 2024-03-10: 1 via OPHTHALMIC

## 2024-03-10 MED ORDER — PHENYLEPHRINE HCL 2.5 % OP SOLN
1.0000 [drp] | OPHTHALMIC | Status: AC | PRN
  Administered 2024-03-10 (×3): 1 [drp] via OPHTHALMIC

## 2024-03-10 MED ORDER — STERILE WATER FOR IRRIGATION IR SOLN
Status: DC | PRN
  Administered 2024-03-10: 1

## 2024-03-10 MED ORDER — LACTATED RINGERS IV SOLN: INTRAVENOUS | Status: DC

## 2024-03-10 MED ORDER — LIDOCAINE HCL (PF) 1 % IJ SOLN
INTRAMUSCULAR | Status: DC | PRN
  Administered 2024-03-10: 1 mL

## 2024-03-10 MED ORDER — TETRACAINE HCL 0.5 % OP SOLN
1.0000 [drp] | OPHTHALMIC | Status: AC | PRN
  Administered 2024-03-10 (×3): 1 [drp] via OPHTHALMIC

## 2024-03-10 MED ORDER — MOXIFLOXACIN HCL 5 MG/ML IO SOLN
INTRAOCULAR | Status: DC | PRN
  Administered 2024-03-10: .2 mL via INTRACAMERAL

## 2024-03-10 MED ORDER — BSS IO SOLN
INTRAOCULAR | Status: DC | PRN
  Administered 2024-03-10: 15 mL via INTRAOCULAR

## 2024-03-10 MED ORDER — PHENYLEPHRINE-KETOROLAC 1-0.3 % IO SOLN
INTRAOCULAR | Status: DC | PRN
  Administered 2024-03-10: 500 mL via OPHTHALMIC

## 2024-03-10 MED ORDER — TROPICAMIDE 1 % OP SOLN
1.0000 [drp] | OPHTHALMIC | Status: AC | PRN
  Administered 2024-03-10 (×3): 1 [drp] via OPHTHALMIC

## 2024-03-10 SURGICAL SUPPLY — 11 items
CLOTH BEACON ORANGE TIMEOUT ST (SAFETY) ×1 IMPLANT
EYE SHIELD UNIVERSAL CLEAR (GAUZE/BANDAGES/DRESSINGS) IMPLANT
FEE CATARACT SUITE SIGHTPATH (MISCELLANEOUS) ×1 IMPLANT
GLOVE BIOGEL PI IND STRL 7.0 (GLOVE) ×2 IMPLANT
LENS IOL TECNIS EYHANCE 18.5 (Intraocular Lens) IMPLANT
NDL HYPO 18GX1.5 BLUNT FILL (NEEDLE) ×1 IMPLANT
NEEDLE HYPO 18GX1.5 BLUNT FILL (NEEDLE) ×1 IMPLANT
PAD ARMBOARD POSITIONER FOAM (MISCELLANEOUS) ×1 IMPLANT
SYR TB 1ML LL NO SAFETY (SYRINGE) ×1 IMPLANT
TAPE SURG TRANSPORE 1 IN (GAUZE/BANDAGES/DRESSINGS) IMPLANT
WATER STERILE IRR 250ML POUR (IV SOLUTION) ×1 IMPLANT

## 2024-03-10 NOTE — Interval H&P Note (Signed)
 History and Physical Interval Note:  03/10/2024 10:05 AM  Chloe Golden  has presented today for surgery, with the diagnosis of nuclear sclerotic cataract, right eye.  The various methods of treatment have been discussed with the patient and family. After consideration of risks, benefits and other options for treatment, the patient has consented to  Procedure(s): PHACOEMULSIFICATION, CATARACT, WITH IOL INSERTION (Right) as a surgical intervention.  The patient's history has been reviewed, patient examined, no change in status, stable for surgery.  I have reviewed the patient's chart and labs.  Questions were answered to the patient's satisfaction.     HARRIE AGENT

## 2024-03-10 NOTE — Discharge Instructions (Signed)
 Please discharge patient when stable, will follow up today with Dr. June Leap at the [facility] office immediately following discharge.  Leave shield in place until visit.  All paperwork with discharge instructions will be given at the office.  [facility] Address:  [address]  [place], [place] [address]

## 2024-03-10 NOTE — Transfer of Care (Signed)
 Immediate Anesthesia Transfer of Care Note  Patient: Chloe Golden  Procedure(s) Performed: PHACOEMULSIFICATION, CATARACT, WITH IOL INSERTION (Right: Eye)  Patient Location: Short Stay  Anesthesia Type:MAC  Level of Consciousness: awake, alert , and oriented  Airway & Oxygen Therapy: Patient Spontanous Breathing  Post-op Assessment: Report given to RN and Post -op Vital signs reviewed and stable  Post vital signs: Reviewed and stable  Last Vitals:  Vitals Value Taken Time  BP 145/73 03/10/24 10:30  Temp 36.6 C 03/10/24 10:30  Pulse 84 03/10/24 10:30  Resp 15 03/10/24 10:30  SpO2 99 % 03/10/24 10:30    Last Pain:  Vitals:   03/10/24 1030  PainSc: 0-No pain         Complications: No notable events documented.

## 2024-03-10 NOTE — Op Note (Signed)
 Date of procedure: 03/10/24  Pre-operative diagnosis:  Visually significant combined form age-related cataract, Right Eye (H25.811)  Post-operative diagnosis:  Visually significant combined form age-related cataract, Right Eye (H25.811)  Procedure: Removal of cataract via phacoemulsification and insertion of intra-ocular lens Vicci and Johnson DIB00 +18.5D into the capsular bag of the Right Eye  Attending surgeon: Lynwood LABOR. Jaylyn Iyer, MD, MA  Anesthesia: MAC, Topical Akten   Complications: None  Estimated Blood Loss: <17mL (minimal)  Specimens: None  Implants: As above  Indications:  Visually significant age-related cataract, Right Eye  Procedure:  The patient was seen and identified in the pre-operative area. The operative eye was identified and dilated.  The operative eye was marked.  Topical anesthesia was administered to the operative eye.     The patient was then to the operative suite and placed in the supine position.  A timeout was performed confirming the patient, procedure to be performed, and all other relevant information.   The patient's face was prepped and draped in the usual fashion for intra-ocular surgery.  A lid speculum was placed into the operative eye and the surgical microscope moved into place and focused.  A superotemporal paracentesis was created using a 20 gauge paracentesis blade. Omidria  was injected into the anterior chamber. Shugarcaine was injected into the anterior chamber.  Viscoelastic was injected into the anterior chamber.  A temporal clear-corneal main wound incision was created using a 2.36mm microkeratome.  A continuous curvilinear capsulorrhexis was initiated using an irrigating cystitome and completed using capsulorrhexis forceps.  Hydrodissection and hydrodeliniation were performed.  Viscoelastic was injected into the anterior chamber.  A phacoemulsification handpiece and a chopper as a second instrument were used to remove the nucleus and epinucleus.  The irrigation/aspiration handpiece was used to remove any remaining cortical material.   The capsular bag was reinflated with viscoelastic, checked, and found to be intact.  The intraocular lens was inserted into the capsular bag.  The irrigation/aspiration handpiece was used to remove any remaining viscoelastic.  The clear corneal wound and paracentesis wounds were then hydrated and checked with Weck-Cels to be watertight. 0.1mL of Moxfloxacin was injected into the anterior chamber. The lid-speculum was removed.  The drape was removed.  The patient's face was cleaned with a wet and dry 4x4. A clear shield was taped over the eye. The patient was taken to the post-operative care unit in good condition, having tolerated the procedure well.  Post-Op Instructions: The patient will follow up at [facility] for a same day post-operative evaluation and will receive all other orders and instructions.

## 2024-03-10 NOTE — Anesthesia Preprocedure Evaluation (Signed)
 Anesthesia Evaluation  Patient identified by MRN, date of birth, ID band Patient awake    Reviewed: Allergy & Precautions, H&P , NPO status , Patient's Chart, lab work & pertinent test results, reviewed documented beta blocker date and time   Airway Mallampati: II  TM Distance: >3 FB Neck ROM: full    Dental no notable dental hx.    Pulmonary sleep apnea    Pulmonary exam normal breath sounds clear to auscultation       Cardiovascular Exercise Tolerance: Good hypertension, + Peripheral Vascular Disease   Rhythm:regular Rate:Normal     Neuro/Psych  PSYCHIATRIC DISORDERS Anxiety Depression   Dementia  Neuromuscular disease    GI/Hepatic Neg liver ROS, hiatal hernia,GERD  ,,  Endo/Other  diabetes    Renal/GU negative Renal ROS  negative genitourinary   Musculoskeletal   Abdominal   Peds  Hematology negative hematology ROS (+)   Anesthesia Other Findings   Reproductive/Obstetrics negative OB ROS                              Anesthesia Physical Anesthesia Plan  ASA: 3  Anesthesia Plan: MAC   Post-op Pain Management:    Induction:   PONV Risk Score and Plan:   Airway Management Planned:   Additional Equipment:   Intra-op Plan:   Post-operative Plan:   Informed Consent: I have reviewed the patients History and Physical, chart, labs and discussed the procedure including the risks, benefits and alternatives for the proposed anesthesia with the patient or authorized representative who has indicated his/her understanding and acceptance.     Dental Advisory Given  Plan Discussed with: CRNA  Anesthesia Plan Comments:         Anesthesia Quick Evaluation

## 2024-03-13 ENCOUNTER — Encounter (HOSPITAL_COMMUNITY): Payer: Self-pay | Admitting: Ophthalmology

## 2024-03-17 NOTE — Anesthesia Postprocedure Evaluation (Signed)
 Anesthesia Post Note  Patient: Chloe Golden  Procedure(s) Performed: PHACOEMULSIFICATION, CATARACT, WITH IOL INSERTION (Right: Eye)  Patient location during evaluation: Phase II Anesthesia Type: MAC Level of consciousness: awake Pain management: pain level controlled Vital Signs Assessment: post-procedure vital signs reviewed and stable Respiratory status: spontaneous breathing and respiratory function stable Cardiovascular status: blood pressure returned to baseline and stable Postop Assessment: no headache and no apparent nausea or vomiting Anesthetic complications: no Comments: Late entry   No notable events documented.   Last Vitals:  Vitals:   03/10/24 0914 03/10/24 1030  BP: (!) 145/69 (!) 145/73  Pulse:  84  Resp: 17 15  Temp: 36.4 C 36.6 C  SpO2: 100% 99%    Last Pain:  Vitals:   03/10/24 1030  PainSc: 0-No pain                 Yvonna JINNY [name]

## 2024-03-27 ENCOUNTER — Ambulatory Visit: Admitting: Podiatry

## 2024-04-04 ENCOUNTER — Encounter: Payer: Self-pay | Admitting: Diagnostic Neuroimaging

## 2024-04-04 ENCOUNTER — Ambulatory Visit (INDEPENDENT_AMBULATORY_CARE_PROVIDER_SITE_OTHER): Admitting: Diagnostic Neuroimaging

## 2024-04-04 VITALS — BP 120/70 | HR 80 | Ht 64.0 in | Wt 145.0 lb

## 2024-04-04 DIAGNOSIS — R413 Other amnesia: Secondary | ICD-10-CM | POA: Diagnosis not present

## 2024-04-04 NOTE — Progress Notes (Signed)
 [facility]  PATIENT: Chloe Golden DOB: 05-08-1946  REFERRING CLINICIAN: Shona Norleen PEDLAR, MD HISTORY FROM: patient  REASON FOR VISIT: follow up   HISTORICAL  CHIEF COMPLAINT:  Chief Complaint  Patient presents with   RM 7    Patient is here alone for dementia - MMSE 25     HISTORY OF PRESENT ILLNESS:   UPDATE (04/04/24, VRP): Since last visit, still with high stress related to husbands medical issues. Depression is better. Memory is stable / improved. Some tension type headaches (daily). Tremor is intermittent. Gait and falls are improved.   UPDATE (02/24/23, VRP): Since last visit, doing about the same. Continues with memory / attention issues. Still feeling nervous / anxious. More balance issues. More depression issues.  PRIOR HPI (09/28/22, VRP): 78 year old female here for evaluation of head injuries, falls, neuropathy.  Patient 3 patient was having increasing problems with balance and walking.  She had multiple falls, up to 8 times over 6 months.  She hit her head several times.  Balance issues related to 20-year history of neuropathy.  Also with fibromyalgia, rheumatoid arthritis and osteoarthritis.  Also with insomnia and polypharmacy.  Since her medications have been reduced she feels more clearheaded and balance is improved.  No ongoing sequelae from her head injuries.  Previously was having some issues with memory loss related to chronic pain and insomnia.  Patient was on room.  Able to maintain all of her ADLs.   REVIEW OF SYSTEMS: Full 14 system review of systems performed and negative with exception of: as per HPI.  ALLERGIES: Allergies  Allergen Reactions   Atorvastatin     Other Reaction(s): Not available   Codeine Other (See Comments)    Constipation   Dicyclomine     TURNED HER SKIN BLUE   Eszopiclone     Other Reaction(s): Not available   Ibuprofen Other (See Comments)    Ulcers    Keflex  [Cephalexin ] Nausea Only   Paroxetine      Other Reaction(s): Not available   Sulfa Antibiotics     Yeast infections    Valium [Diazepam] Other (See Comments)    Alters mental status    HOME MEDICATIONS: Outpatient Medications Prior to Visit  Medication Sig Dispense Refill   ACCU-CHEK GUIDE test strip See admin instructions.     ALPRAZolam (XANAX) 0.5 MG tablet Take 0.5-1 mg by mouth See admin instructions. 0.5 mg in the morning, 1 mg at bedtime     amitriptyline  (ELAVIL ) 25 MG tablet TAKE ONE TABLET BY MOUTH WITH SUPPER 270 tablet 0   amLODipine (NORVASC) 5 MG tablet Take 5 mg by mouth daily.     cetirizine (ZYRTEC) 10 MG tablet Take 10 mg by mouth daily.     Coenzyme Q10 (COQ-10) 100 MG CAPS Take 100 mg by mouth daily at 6 (six) AM.     conjugated estrogens (PREMARIN) vaginal cream Place 1 applicator vaginally See admin instructions. Twice a week as directed     furosemide (LASIX) 20 MG tablet Take 20 mg by mouth daily.     GEMTESA 75 MG TABS Take 75 mg by mouth daily.     hydroxychloroquine (PLAQUENIL) 200 MG tablet Take 200 mg by mouth daily.     JARDIANCE 10 MG TABS tablet Take 10 mg by mouth daily.     leflunomide (ARAVA) 10 MG tablet Take 10 mg by mouth daily.     losartan-hydrochlorothiazide (HYZAAR) 100-12.5 MG tablet Take 1 tablet by mouth daily.  lubiprostone  (AMITIZA ) 8 MCG capsule TAKE ONE CAPSULE BY MOUTH TWICE DAILY WITH A MEAL 60 capsule 3   memantine (NAMENDA) 5 MG tablet Take 2 tablets by mouth daily.     Multiple Vitamins-Minerals (MULTIVITAMIN WITH MINERALS) tablet Take 1 tablet by mouth daily.     Omega-3 Fatty Acids (FISH OIL) 1000 MG CAPS Take 1,000 mg by mouth daily.     propranolol (INDERAL) 10 MG tablet Take 10 mg by mouth daily.     RABEprazole  (ACIPHEX ) 20 MG tablet TAKE 1 TABLET (20 MG TOTAL) BY MOUTH IN THE MORNING AND AT BEDTIME. 60 tablet 5   rOPINIRole (REQUIP) 0.5 MG tablet Take 1.5 mg by mouth at bedtime. (Patient taking differently: Take 10 mg by mouth in the morning and at bedtime.)      traZODone (DESYREL) 50 MG tablet Take 50 mg by mouth at bedtime.     vortioxetine HBr (TRINTELLIX) 10 MG TABS tablet Take 10 mg by mouth daily.     No facility-administered medications prior to visit.    PAST MEDICAL HISTORY: Past Medical History:  Diagnosis Date   Acid reflux    Arthritis    Degenerative arthritis of cervical spine    Diabetes mellitus without complication (HCC)    Fibromyalgia    Functional gastrointestinal disorder    GERD (gastroesophageal reflux disease)    Glaucoma    Hiatal hernia    Hypertension    Neuropathy    OAB (overactive bladder)    OSA on CPAP    stopped 2.5 years ago as of 02/10/2024-could not tolerate the mask   PAD (peripheral artery disease)    Mild by arterial Dopplers April 2019   Rheumatoid arthritis(714.0)    Venous reflux     PAST SURGICAL HISTORY: Past Surgical History:  Procedure Laterality Date   ABDOMINAL HYSTERECTOMY     BACTERIAL OVERGROWTH TEST N/A 11/09/2012   Procedure: BACTERIAL OVERGROWTH TEST;  Surgeon: Margo LITTIE Haddock, MD;  [facility]: [facility];  Service: Endoscopy;  Laterality: N/A;  7:30   BALLOON DILATION N/A 07/07/2021   Procedure: BALLOON DILATION;  Surgeon: Cindie Carlin POUR, DO;  Location: [facility];  Service: Endoscopy;  Laterality: N/A;   BIOPSY N/A 10/28/2012   Procedure: BIOPSY;  Surgeon: Margo LITTIE Haddock, MD;  [facility];  Service: Endoscopy;  Laterality: N/A;  DUODENAL BIOPSY   BIOPSY  07/07/2021   Procedure: BIOPSY;  Surgeon: Cindie Carlin POUR, DO;  [facility];  Service: Endoscopy;;   BIOPSY  10/20/2021   Procedure: BIOPSY;  Surgeon: Cindie Carlin POUR, DO;  [facility];  Service: Endoscopy;;   BLADDER SURGERY     CARPAL TUNNEL RELEASE Bilateral    CARPOMETACARPEL SUSPENSION PLASTY Right 06/30/2022   Procedure: RIGHT THUMB TRAPEZIECTOMY AND SUSPENSIONPLASTY;  Surgeon: Murrell Drivers, MD;  Location: [facility];  Service: Orthopedics;  Laterality: Right;   90 MIN AUX BLOCK   CATARACT EXTRACTION W/PHACO Left 02/14/2024   Procedure: PHACOEMULSIFICATION, CATARACT, WITH IOL INSERTION;  Surgeon: Harrie Agent, MD;  Location: [facility];  Service: Ophthalmology;  Laterality: Left;  CDE: 11.78   CATARACT EXTRACTION W/PHACO Right 03/10/2024   Procedure: PHACOEMULSIFICATION, CATARACT, WITH IOL INSERTION;  Surgeon: Harrie Agent, MD;  Location: [facility];  Service: Ophthalmology;  Laterality: Right;  CDE: 16.91   CHOLECYSTECTOMY     COLONOSCOPY WITH ESOPHAGOGASTRODUODENOSCOPY (EGD) N/A 10/28/2012   DOQ:Fnizmjuz diverticulosis in the descending colon and sigmoid colon/Small internal hemorrhoids   COLONOSCOPY WITH  PROPOFOL  Left 10/20/2021   Procedure: COLONOSCOPY WITH PROPOFOL ;  Surgeon: Cindie Carlin POUR, DO;  [facility];  Service: Endoscopy;  Laterality: Left;  7:30am   ESOPHAGEAL DILATION N/A 10/12/2023   Procedure: DILATION, ESOPHAGUS;  Surgeon: Eartha Angelia Sieving, MD;  [facility];  Service: Gastroenterology;  Laterality: N/A;   ESOPHAGOGASTRODUODENOSCOPY N/A 11/27/2016   Procedure: ESOPHAGOGASTRODUODENOSCOPY (EGD);  Surgeon: Harvey Margo CROME, MD;  [facility];  Service: Endoscopy;  Laterality: N/A;  830    ESOPHAGOGASTRODUODENOSCOPY N/A 10/12/2023   Procedure: EGD (ESOPHAGOGASTRODUODENOSCOPY);  Surgeon: Eartha Angelia, Sieving, MD;  [facility];  Service: Gastroenterology;  Laterality: N/A;  815AM, ASA 3   ESOPHAGOGASTRODUODENOSCOPY (EGD) WITH ESOPHAGEAL DILATION N/A 10/28/2012   DOQ:EMNKPFJO CERVIAL WEB, S/P DILATION/MILD Non-erosive gastritis & DUODENITIS   ESOPHAGOGASTRODUODENOSCOPY (EGD) WITH PROPOFOL  N/A 07/07/2021   gastritis s/p biopsy, normal duodenum. dilation.  Negative H.pylori.   POLYPECTOMY  10/20/2021   Procedure: POLYPECTOMY;  Surgeon: Cindie Carlin POUR, DO;  [facility];  Service: Endoscopy;;   SAVORY DILATION N/A 11/27/2016   Procedure: SAVORY DILATION;  Surgeon: Harvey Margo CROME, MD;  [facility];  Service: Endoscopy;  Laterality: N/A;   TOTAL KNEE ARTHROPLASTY Right     FAMILY HISTORY: Family History  Problem Relation Age of Onset   Lung cancer Father    Diabetes Mellitus II Father    Crohn's disease Cousin    Hypertension Mother    Cystic fibrosis Mother    Lung cancer Brother    Hypertension Brother    Lupus Niece    Colon cancer Neg Hx    Celiac disease Neg Hx    Gastric cancer Neg Hx    Esophageal cancer Neg Hx     SOCIAL HISTORY: Social History   Socioeconomic History   Marital status: Single    Spouse name: Not on file   Number of children: Not on file   Years of education: Not on file   Highest education level: Not on file  Occupational History   Not on file  Tobacco Use   Smoking status: Never    Passive exposure: Past   Smokeless tobacco: Never  Vaping Use   Vaping status: Never Used  Substance and Sexual Activity   Alcohol use: No   Drug use: No   Sexual activity: Yes    Birth control/protection: Surgical    Comment: hyst  Other Topics Concern   Not on file  Social History Narrative   1 living   2 deceased   Social Drivers of Corporate investment banker Strain: Not on file  Food Insecurity: Low Risk  (04/07/2023)   Received from [facility]   Hunger Vital Sign    Within the past 12 months, you worried that your food would run out before you got money to buy more: Never true    Within the past 12 months, the food you bought just didn't last and you didn't have money to get more. : Never true  Transportation Needs: No Transportation Needs (04/07/2023)   Received from [facility]    In the past 12 months, has lack of reliable transportation kept you from medical appointments, meetings, work or from getting things needed for daily living? : No  Physical Activity: Not on file  Stress: Not on file  Social Connections: Not on file  Intimate Partner Violence: Not on file      PHYSICAL EXAM  GENERAL  EXAM/CONSTITUTIONAL: Vitals:  Vitals:   04/04/24 1052  BP: 120/70  Pulse: 80  SpO2: 96%  Weight: 145 lb (65.8 kg)  Height: 5' 4 (1.626 m)   Body mass index is 24.89 kg/m. Wt Readings from Last 3 Encounters:  04/04/24 145 lb (65.8 kg)  03/10/24 140 lb (63.5 kg)  02/10/24 139 lb (63 kg)   Patient is in no distress; well developed, nourished and groomed; neck is supple  CARDIOVASCULAR: Examination of carotid arteries is normal; no carotid bruits Regular rate and rhythm, no murmurs Examination of peripheral vascular system by observation and palpation is normal  EYES: Ophthalmoscopic exam of optic discs and posterior segments is normal; no papilledema or hemorrhages No results found.  MUSCULOSKELETAL: Gait, strength, tone, movements noted in Neurologic exam below  NEUROLOGIC: MENTAL STATUS:     04/04/2024   10:58 AM 02/24/2023   11:22 AM  MMSE - Mini Mental State Exam  Orientation to time 5 5  Orientation to Place 5 5  Registration 3 3  Attention/ Calculation 1 5  Recall 3 3  Language- name 2 objects 2 2  Language- repeat 1 1  Language- follow 3 step command 3 3  Language- read & follow direction 1 1  Write a sentence 1 1  Copy design 0 0  Total score 25 29   awake, alert, oriented to person, place and time recent and remote memory intact normal attention and concentration language fluent, comprehension intact, naming intact fund of knowledge appropriate  CRANIAL NERVE:  2nd - no papilledema on fundoscopic exam 2nd, 3rd, 4th, 6th - pupils equal and reactive to light, visual fields full to confrontation, extraocular muscles intact, no nystagmus 5th - facial sensation symmetric 7th - facial strength symmetric 8th - hearing intact 9th - palate elevates symmetrically, uvula midline 11th - shoulder shrug symmetric 12th - tongue protrusion midline  MOTOR:  normal bulk and tone, full strength in the BUE, BLE NO  TREMOR  SENSORY:  normal and symmetric to light touch, pinprick, temperature, vibration; EXCEPT DECR PP IN FEET/ ANKLES  COORDINATION:  finger-nose-finger, fine finger movements normal  REFLEXES:  deep tendon reflexes TRACE and symmetric  GAIT/STATION:  SHORT STEPS; SLOW, STOOPED POSTURE, UNSTEADY     DIAGNOSTIC DATA (LABS, IMAGING, TESTING) - I reviewed patient records, labs, notes, testing and imaging myself where available.  Lab Results  Component Value Date   WBC 6.3 02/09/2022   HGB 14.6 02/09/2022   HCT 46.1 (H) 02/09/2022   MCV 93.7 02/09/2022   PLT 268 02/09/2022      Component Value Date/Time   NA 138 10/07/2023 1241   K 3.6 10/07/2023 1241   CL 99 10/07/2023 1241   CO2 28 10/07/2023 1241   GLUCOSE 84 10/07/2023 1241   BUN 16 10/07/2023 1241   CREATININE 0.90 10/07/2023 1241   CALCIUM 9.0 10/07/2023 1241   PROT 7.4 02/09/2022 1318   ALBUMIN 3.9 02/09/2022 1318   AST 23 02/09/2022 1318   ALT 29 02/09/2022 1318   ALKPHOS 83 02/09/2022 1318   BILITOT 0.3 02/09/2022 1318   GFRNONAA >60 10/07/2023 1241   GFRAA 82 (L) 09/26/2012 2027   No results found for: CHOL, HDL, LDLCALC, LDLDIRECT, TRIG, CHOLHDL No results found for: YHAJ8R No results found for: VITAMINB12 No results found for: TSH    12/16/22 CT head / cervical spine 1. No acute intracranial process. 2. No acute fracture or traumatic listhesis in the cervical spine.   ASSESSMENT AND PLAN  78 y.o.  year old female here with:  Dx:  1. Memory loss     PLAN:  MEMORY LOSS (MMSE 25/30; no changes in ADLs; likely due to chronic pain, insomnia, depression, anxiety) - check ATN panel - hold off on memantine and donepezil - safety / supervision issues reviewed - daily physical activity / exercise (at least 15-30 minutes) - eat more plants / vegetables - increase social activities, brain stimulation, games, puzzles, hobbies, crafts, arts, music - aim for at least 7-8 hours  sleep per night (or more) - avoid smoking and alcohol - caution with medications, finances, driving  TENSION HEADACHES (could be due to due to stress, insomnia, fibromyalgia) - continue tylenol  as needed  RECURRENT FALLS (improved; due to neuropathy and polypharmacy) - consider PT evaluation; use cane / walker  NEUROPATHY (diabetic) - continue supportive care; gabapentin  and amitriptyline   FIBROMYALGIA / RHEUMATOID ARTHRITIS / OSTEOARTHRITIS - continue supportive care; follow up with Dr. Curt  Orders Placed This Encounter  Procedures   ATN PROFILE   Return for return to PCP, pending if symptoms worsen or fail to improve.    EDUARD FABIENE HANLON, MD 04/04/2024, 11:37 AM Certified in Neurology, Neurophysiology and Neuroimaging  [facility] [address] [place], [place] [address] [phone]

## 2024-04-04 NOTE — Patient Instructions (Signed)
 MEMORY LOSS (MMSE 25/30; no changes in ADLs; likely due to chronic pain, insomnia, depression, anxiety) - check ATN panel - hold off on memantine and donepezil - safety / supervision issues reviewed - daily physical activity / exercise (at least 15-30 minutes) - eat more plants / vegetables - increase social activities, brain stimulation, games, puzzles, hobbies, crafts, arts, music - aim for at least 7-8 hours sleep per night (or more) - avoid smoking and alcohol - caution with medications, finances, driving  TENSION HEADACHES (could be due to due to stress, insomnia, fibromyalgia) - continue tylenol  as needed  RECURRENT FALLS (previously; due to neuropathy and polypharmacy) - consider PT evaluation; use cane / walker at all times

## 2024-04-05 ENCOUNTER — Encounter (INDEPENDENT_AMBULATORY_CARE_PROVIDER_SITE_OTHER): Payer: Self-pay | Admitting: Gastroenterology

## 2024-04-07 ENCOUNTER — Ambulatory Visit: Payer: Self-pay | Admitting: Diagnostic Neuroimaging

## 2024-04-07 LAB — ATN PROFILE
A -- Beta-amyloid 42/40 Ratio: 0.123 (ref >0.102)
Beta-amyloid 40: 201.42 pg/mL
Beta-amyloid 42: 24.8 pg/mL
N -- NfL, Plasma: 2.33 pg/mL (ref 0.00–6.04)
T -- p-tau181: 0.91 pg/mL (ref 0.00–0.97)

## 2024-04-17 ENCOUNTER — Encounter: Payer: Self-pay | Admitting: Podiatry

## 2024-04-17 ENCOUNTER — Ambulatory Visit (INDEPENDENT_AMBULATORY_CARE_PROVIDER_SITE_OTHER): Admitting: Podiatry

## 2024-04-17 DIAGNOSIS — M79674 Pain in right toe(s): Secondary | ICD-10-CM | POA: Diagnosis not present

## 2024-04-17 DIAGNOSIS — B351 Tinea unguium: Secondary | ICD-10-CM

## 2024-04-17 DIAGNOSIS — M79675 Pain in left toe(s): Secondary | ICD-10-CM

## 2024-04-17 MED ORDER — CICLOPIROX 8 % EX SOLN: Freq: Every day | CUTANEOUS | 11 refills | Status: AC

## 2024-04-17 NOTE — Progress Notes (Signed)
 Subjective:  Patient ID: Chloe Golden, female    DOB: Nov 02, 1945,  MRN: [id number]  Chloe Golden presents to clinic today for:  Chief Complaint  Patient presents with   Diabetes    [facility] & Qutenza  application. RFC A1C 5.2. Toenail trim and Qutenza  treatment.   78 year old diabetic female patient notes nails are thick, discolored, elongated and painful in shoegear when trying to ambulate.  She did note that she would like to start treating her toenail fungus.  She is also scheduled for her second Qutenza  application today.  However, upon arrival to the exam room, she noted that she changed insurance since last seen and had not notified the office in advance.  The Qutenza  does require prior authorization and the current prior authorization is for her old insurance.  After speaking with our prior authorization team, we will hold off on the Qutenza  application today to get clarification on prior Auth with the current/new insurance.  Patient expressed understanding.  PCP is Shona Norleen PEDLAR, MD.  Past Medical History:  Diagnosis Date   Acid reflux    Arthritis    Degenerative arthritis of cervical spine    Diabetes mellitus without complication (HCC)    Fibromyalgia    Functional gastrointestinal disorder    GERD (gastroesophageal reflux disease)    Glaucoma    Hiatal hernia    Hypertension    Neuropathy    OAB (overactive bladder)    OSA on CPAP    stopped 2.5 years ago as of 02/10/2024-could not tolerate the mask   PAD (peripheral artery disease)    Mild by arterial Dopplers April 2019   Rheumatoid arthritis(714.0)    Venous reflux    Past Surgical History:  Procedure Laterality Date   ABDOMINAL HYSTERECTOMY     BACTERIAL OVERGROWTH TEST N/A 11/09/2012   Procedure: BACTERIAL OVERGROWTH TEST;  Surgeon: Margo LITTIE Haddock, MD;  [facility];  Service: Endoscopy;  Laterality: N/A;  7:30   BALLOON DILATION N/A 07/07/2021   Procedure: BALLOON DILATION;  Surgeon: Cindie Carlin POUR, DO;  [facility];  Service: Endoscopy;  Laterality: N/A;   BIOPSY N/A 10/28/2012   Procedure: BIOPSY;  Surgeon: Margo LITTIE Haddock, MD;  [facility];  Service: Endoscopy;  Laterality: N/A;  DUODENAL BIOPSY   BIOPSY  07/07/2021   Procedure: BIOPSY;  Surgeon: Cindie Carlin POUR, DO;  [facility];  Service: Endoscopy;;   BIOPSY  10/20/2021   Procedure: BIOPSY;  Surgeon: Cindie Carlin POUR, DO;  [facility];  Service: Endoscopy;;   BLADDER SURGERY     CARPAL TUNNEL RELEASE Bilateral    CARPOMETACARPEL SUSPENSION PLASTY Right 06/30/2022   Procedure: RIGHT THUMB TRAPEZIECTOMY AND SUSPENSIONPLASTY;  Surgeon: Murrell Drivers, MD;  Location: [facility];  Service: Orthopedics;  Laterality: Right;  90 MIN AUX BLOCK   CATARACT EXTRACTION W/PHACO Left 02/14/2024   Procedure: PHACOEMULSIFICATION, CATARACT, WITH IOL INSERTION;  Surgeon: Harrie Agent, MD;  [facility]: [facility];  Service: Ophthalmology;  Laterality: Left;  CDE: 11.78   CATARACT EXTRACTION W/PHACO Right 03/10/2024   Procedure: PHACOEMULSIFICATION, CATARACT, WITH IOL INSERTION;  Surgeon: Harrie Agent, MD;  [facility]: [facility];  Service: Ophthalmology;  Laterality: Right;  CDE: 16.91   CHOLECYSTECTOMY     COLONOSCOPY WITH ESOPHAGOGASTRODUODENOSCOPY (EGD) N/A 10/28/2012   DOQ:Fnizmjuz diverticulosis in the descending colon and sigmoid colon/Small internal hemorrhoids   COLONOSCOPY WITH PROPOFOL  Left 10/20/2021   Procedure: COLONOSCOPY WITH PROPOFOL ;  Surgeon: Cindie Carlin POUR, DO;  [facility];  Service: Endoscopy;  Laterality: Left;  7:30am   ESOPHAGEAL DILATION N/A 10/12/2023   Procedure: DILATION, ESOPHAGUS;  Surgeon: Eartha Angelia Sieving, MD;  [facility];  Service: Gastroenterology;  Laterality: N/A;   ESOPHAGOGASTRODUODENOSCOPY N/A 11/27/2016   Procedure: ESOPHAGOGASTRODUODENOSCOPY (EGD);  Surgeon: Harvey Margo CROME, MD;  [facility];  Service:  Endoscopy;  Laterality: N/A;  830    ESOPHAGOGASTRODUODENOSCOPY N/A 10/12/2023   Procedure: EGD (ESOPHAGOGASTRODUODENOSCOPY);  Surgeon: Eartha Angelia, Sieving, MD;  [facility];  Service: Gastroenterology;  Laterality: N/A;  815AM, ASA 3   ESOPHAGOGASTRODUODENOSCOPY (EGD) WITH ESOPHAGEAL DILATION N/A 10/28/2012   DOQ:EMNKPFJO CERVIAL WEB, S/P DILATION/MILD Non-erosive gastritis & DUODENITIS   ESOPHAGOGASTRODUODENOSCOPY (EGD) WITH PROPOFOL  N/A 07/07/2021   gastritis s/p biopsy, normal duodenum. dilation.  Negative H.pylori.   POLYPECTOMY  10/20/2021   Procedure: POLYPECTOMY;  Surgeon: Cindie Carlin POUR, DO;  [facility];  Service: Endoscopy;;   SAVORY DILATION N/A 11/27/2016   Procedure: SAVORY DILATION;  Surgeon: Harvey Margo CROME, MD;  [facility];  Service: Endoscopy;  Laterality: N/A;   TOTAL KNEE ARTHROPLASTY Right    Allergies  Allergen Reactions   Atorvastatin     Other Reaction(s): Not available   Codeine Other (See Comments)    Constipation   Dicyclomine     TURNED HER SKIN BLUE   Eszopiclone     Other Reaction(s): Not available   Ibuprofen Other (See Comments)    Ulcers    Keflex  [Cephalexin ] Nausea Only   Paroxetine     Other Reaction(s): Not available   Sulfa Antibiotics     Yeast infections    Valium [Diazepam] Other (See Comments)    Alters mental status    Review of Systems: Negative except as noted in the HPI.  Objective:  Chloe Golden is a pleasant 78 y.o. female in NAD. AAO x 3.  Vascular Examination: Capillary refill time is 3-5 seconds to toes bilateral. Palpable pedal pulses b/l LE. Digital hair present b/l.  Skin temperature gradient WNL b/l. No varicosities b/l. No cyanosis noted b/l.   Dermatological Examination: Pedal skin with normal turgor, texture and tone b/l. No open wounds. No interdigital macerations b/l. Toenails x10 are 3mm thick, discolored, dystrophic with subungual debris. There is pain with  compression of the nail plates.  They are elongated x10  Assessment/Plan: 1. Pain due to onychomycosis of toenails of both feet     Meds ordered this encounter  Medications   ciclopirox (PENLAC) 8 % solution    Sig: Apply topically at bedtime. Apply thin layer over nail. Apply daily over previous coat. Remove weekly with polish remover.    Dispense:  6.6 mL    Refill:  11   The mycotic toenails were sharply debrided x10 with sterile nail nippers and a power debriding burr to decrease bulk/thickness and length.    Prescription for ciclopirox 8% solution prescribed for her to begin applying daily to the affected toenails.  Return in about 2 weeks (around 05/01/2024) for Qutenza  application.   Awanda CHARM Imperial, DPM, FACFAS [facility]     [address], [place] [address]  Office [phone]  Fax [phone]

## 2024-04-21 ENCOUNTER — Other Ambulatory Visit (HOSPITAL_COMMUNITY): Payer: Self-pay | Admitting: Internal Medicine

## 2024-04-21 DIAGNOSIS — Z1231 Encounter for screening mammogram for malignant neoplasm of breast: Secondary | ICD-10-CM

## 2024-04-27 ENCOUNTER — Other Ambulatory Visit: Payer: Self-pay | Admitting: Internal Medicine

## 2024-04-27 DIAGNOSIS — K582 Mixed irritable bowel syndrome: Secondary | ICD-10-CM

## 2024-04-27 DIAGNOSIS — K5900 Constipation, unspecified: Secondary | ICD-10-CM

## 2024-05-01 ENCOUNTER — Ambulatory Visit (INDEPENDENT_AMBULATORY_CARE_PROVIDER_SITE_OTHER): Admitting: Podiatry

## 2024-05-01 ENCOUNTER — Encounter: Payer: Self-pay | Admitting: Podiatry

## 2024-05-01 DIAGNOSIS — E1142 Type 2 diabetes mellitus with diabetic polyneuropathy: Secondary | ICD-10-CM

## 2024-05-01 NOTE — Progress Notes (Signed)
 Chief Complaint  Patient presents with   Foot Pain    QUTENZA  application #2. 0 pain. Non diabetic.   HPI: 78 y.o. female presents today for application of 4 sheets of Qutenza  to bilateral foot today (2 sheets per foot).    She feels that there was moderate improvement of her neuropathy symptoms after the first treatment.  She is optimistic that she will have continued improvement with this application.  Past Medical History:  Diagnosis Date   Acid reflux    Arthritis    Degenerative arthritis of cervical spine    Diabetes mellitus without complication (HCC)    Fibromyalgia    Functional gastrointestinal disorder    GERD (gastroesophageal reflux disease)    Glaucoma    Hiatal hernia    Hypertension    Neuropathy    OAB (overactive bladder)    OSA on CPAP    stopped 2.5 years ago as of 02/10/2024-could not tolerate the mask   PAD (peripheral artery disease)    Mild by arterial Dopplers April 2019   Rheumatoid arthritis(714.0)    Venous reflux     Past Surgical History:  Procedure Laterality Date   ABDOMINAL HYSTERECTOMY     BACTERIAL OVERGROWTH TEST N/A 11/09/2012   Procedure: BACTERIAL OVERGROWTH TEST;  Surgeon: Margo LITTIE Haddock, MD;  [facility];  Service: Endoscopy;  Laterality: N/A;  7:30   BALLOON DILATION N/A 07/07/2021   Procedure: BALLOON DILATION;  Surgeon: Cindie Carlin POUR, DO;  [facility];  Service: Endoscopy;  Laterality: N/A;   BIOPSY N/A 10/28/2012   Procedure: BIOPSY;  Surgeon: Margo LITTIE Haddock, MD;  [facility];  Service: Endoscopy;  Laterality: N/A;  DUODENAL BIOPSY   BIOPSY  07/07/2021   Procedure: BIOPSY;  Surgeon: Cindie Carlin POUR, DO;  [facility];  Service: Endoscopy;;   BIOPSY  10/20/2021   Procedure: BIOPSY;  Surgeon: Cindie Carlin POUR, DO;  [facility];  Service: Endoscopy;;   BLADDER SURGERY     CARPAL TUNNEL RELEASE Bilateral    CARPOMETACARPEL SUSPENSION PLASTY Right 06/30/2022    Procedure: RIGHT THUMB TRAPEZIECTOMY AND SUSPENSIONPLASTY;  Surgeon: Murrell Drivers, MD;  Location: [facility];  Service: Orthopedics;  Laterality: Right;  90 MIN AUX BLOCK   CATARACT EXTRACTION W/PHACO Left 02/14/2024   Procedure: PHACOEMULSIFICATION, CATARACT, WITH IOL INSERTION;  Surgeon: Harrie Agent, MD;  [facility];  Service: Ophthalmology;  Laterality: Left;  CDE: 11.78   CATARACT EXTRACTION W/PHACO Right 03/10/2024   Procedure: PHACOEMULSIFICATION, CATARACT, WITH IOL INSERTION;  Surgeon: Harrie Agent, MD;  [facility]: [facility];  Service: Ophthalmology;  Laterality: Right;  CDE: 16.91   CHOLECYSTECTOMY     COLONOSCOPY WITH ESOPHAGOGASTRODUODENOSCOPY (EGD) N/A 10/28/2012   DOQ:Fnizmjuz diverticulosis in the descending colon and sigmoid colon/Small internal hemorrhoids   COLONOSCOPY WITH PROPOFOL  Left 10/20/2021   Procedure: COLONOSCOPY WITH PROPOFOL ;  Surgeon: Cindie Carlin POUR, DO;  [facility];  Service: Endoscopy;  Laterality: Left;  7:30am   ESOPHAGEAL DILATION N/A 10/12/2023   Procedure: DILATION, ESOPHAGUS;  Surgeon: Eartha Angelia Sieving, MD;  [facility];  Service: Gastroenterology;  Laterality: N/A;   ESOPHAGOGASTRODUODENOSCOPY N/A 11/27/2016   Procedure: ESOPHAGOGASTRODUODENOSCOPY (EGD);  Surgeon: Haddock Margo LITTIE, MD;  [facility];  Service: Endoscopy;  Laterality: N/A;  830    ESOPHAGOGASTRODUODENOSCOPY N/A 10/12/2023   Procedure: EGD (ESOPHAGOGASTRODUODENOSCOPY);  Surgeon: Eartha Angelia, Sieving, MD;  [facility];  Service: Gastroenterology;  Laterality: N/A;  815AM, ASA 3   ESOPHAGOGASTRODUODENOSCOPY (EGD) WITH ESOPHAGEAL DILATION N/A 10/28/2012   DOQ:EMNKPFJO CERVIAL WEB, S/P DILATION/MILD Non-erosive gastritis & DUODENITIS   ESOPHAGOGASTRODUODENOSCOPY (EGD) WITH PROPOFOL  N/A 07/07/2021   gastritis s/p biopsy, normal duodenum. dilation.  Negative H.pylori.   POLYPECTOMY  10/20/2021   Procedure:  POLYPECTOMY;  Surgeon: Cindie Carlin POUR, DO;  [facility];  Service: Endoscopy;;   SAVORY DILATION N/A 11/27/2016   Procedure: SAVORY DILATION;  Surgeon: Harvey Margo CROME, MD;  [facility];  Service: Endoscopy;  Laterality: N/A;   TOTAL KNEE ARTHROPLASTY Right     Allergies  Allergen Reactions   Atorvastatin     Other Reaction(s): Not available   Codeine Other (See Comments)    Constipation   Dicyclomine     TURNED HER SKIN BLUE   Eszopiclone     Other Reaction(s): Not available   Ibuprofen Other (See Comments)    Ulcers    Keflex  [Cephalexin ] Nausea Only   Paroxetine     Other Reaction(s): Not available   Sulfa Antibiotics     Yeast infections    Valium [Diazepam] Other (See Comments)    Alters mental status    Review of Systems  Neurological:  Positive for tingling and sensory change.      Physical Exam: Palpable pedal pulses bilateral.  No open lesions or areas of irritated skin.  Interspaces clear.  Decreased vibratory sensation bilateral foot.   Assessment/Plan of Care: 1. Diabetic polyneuropathy associated with type 2 diabetes mellitus (HCC)     Discussed clinical findings with patient today.  Reviewed again how the Qutenza  will act as a neurolytic agent on the peripheral nerves of the feet to decrease her diabetic neuropathy discomfort.   Reviewed Qutenza  application with patient today.  Once applied, she'll sit for 30 minutes while the medication penetrates into the skin.  4 sheets were applied, 2 on each foot.  After 30 minutes, these were discarded and the skin was cleansed with the skin cleanser solution.    Patient advised to avoid direct sunlight to skin of feet for one week, avoid hot tubs, or any hot linaments to the feet.  Avoid hot baths or hot showers, as this may aggravate burning of the skin.    Will f/u in 91 days for Qutenza  application #3.  Informed the patient that I will no longer be at the [facility] location in 2026 and  will only be located at the [place] facility full-time.  I understood that the drive may be too far for the patient.  If so she can be scheduled with one of our other providers.  She did note that her insurance will change next year.  Will send a MyChart message to her reminding her to call our triage office and provide her new insurance information so that we can get the Qutenza  medication reordered and authorized for treatment #3.  Awanda CHARM Imperial, DPM, FACFAS [facility]     [address] [place], [place] [address]                Office [phone]  Fax [phone]

## 2024-05-26 ENCOUNTER — Ambulatory Visit (HOSPITAL_COMMUNITY)

## 2024-06-01 ENCOUNTER — Ambulatory Visit (HOSPITAL_COMMUNITY): Payer: Self-pay

## 2024-06-19 ENCOUNTER — Telehealth: Payer: Self-pay | Admitting: Lab

## 2024-06-19 NOTE — Telephone Encounter (Signed)
 Qutenza  delivery scheduled with [facility] will send before ins expires sending 06/26/2024.

## 2024-07-03 ENCOUNTER — Ambulatory Visit (HOSPITAL_COMMUNITY): Payer: Self-pay

## 2024-07-07 ENCOUNTER — Ambulatory Visit (HOSPITAL_COMMUNITY): Payer: Self-pay

## 2024-07-17 ENCOUNTER — Ambulatory Visit: Admitting: Podiatry

## 2024-07-19 ENCOUNTER — Other Ambulatory Visit: Payer: Self-pay | Admitting: Internal Medicine

## 2024-07-19 DIAGNOSIS — K582 Mixed irritable bowel syndrome: Secondary | ICD-10-CM

## 2024-07-19 DIAGNOSIS — K219 Gastro-esophageal reflux disease without esophagitis: Secondary | ICD-10-CM

## 2024-07-26 ENCOUNTER — Other Ambulatory Visit: Payer: Self-pay | Admitting: Internal Medicine

## 2024-07-26 DIAGNOSIS — K5900 Constipation, unspecified: Secondary | ICD-10-CM

## 2024-07-26 DIAGNOSIS — K582 Mixed irritable bowel syndrome: Secondary | ICD-10-CM

## 2024-07-31 ENCOUNTER — Ambulatory Visit: Admitting: Podiatry

## 2024-08-07 ENCOUNTER — Ambulatory Visit (HOSPITAL_COMMUNITY): Payer: Self-pay

## 2024-08-10 ENCOUNTER — Ambulatory Visit: Admitting: Podiatry
